# Patient Record
Sex: Female | Born: 1945 | Race: Black or African American | Hispanic: No | Smoking: Never smoker
Health system: Southern US, Community
[De-identification: ages and names within clinical notes are randomized; demographics above are authoritative.]

## PROBLEM LIST (undated history)

## (undated) DIAGNOSIS — E119 Type 2 diabetes mellitus without complications: Secondary | ICD-10-CM

## (undated) DIAGNOSIS — I2510 Atherosclerotic heart disease of native coronary artery without angina pectoris: Secondary | ICD-10-CM

## (undated) DIAGNOSIS — R911 Solitary pulmonary nodule: Secondary | ICD-10-CM

## (undated) DIAGNOSIS — N760 Acute vaginitis: Secondary | ICD-10-CM

## (undated) DIAGNOSIS — I739 Peripheral vascular disease, unspecified: Secondary | ICD-10-CM

## (undated) DIAGNOSIS — J189 Pneumonia, unspecified organism: Secondary | ICD-10-CM

## (undated) DIAGNOSIS — M6740 Ganglion, unspecified site: Secondary | ICD-10-CM

## (undated) DIAGNOSIS — G8220 Paraplegia, unspecified: Secondary | ICD-10-CM

## (undated) DIAGNOSIS — F329 Major depressive disorder, single episode, unspecified: Secondary | ICD-10-CM

## (undated) DIAGNOSIS — F32A Depression, unspecified: Secondary | ICD-10-CM

## (undated) DIAGNOSIS — E785 Hyperlipidemia, unspecified: Secondary | ICD-10-CM

## (undated) DIAGNOSIS — M8580 Other specified disorders of bone density and structure, unspecified site: Secondary | ICD-10-CM

## (undated) DIAGNOSIS — I639 Cerebral infarction, unspecified: Secondary | ICD-10-CM

## (undated) DIAGNOSIS — I10 Essential (primary) hypertension: Secondary | ICD-10-CM

## (undated) DIAGNOSIS — K219 Gastro-esophageal reflux disease without esophagitis: Secondary | ICD-10-CM

## (undated) DIAGNOSIS — D649 Anemia, unspecified: Secondary | ICD-10-CM

## (undated) DIAGNOSIS — R0602 Shortness of breath: Secondary | ICD-10-CM

## (undated) DIAGNOSIS — I219 Acute myocardial infarction, unspecified: Secondary | ICD-10-CM

## (undated) DIAGNOSIS — I82409 Acute embolism and thrombosis of unspecified deep veins of unspecified lower extremity: Secondary | ICD-10-CM

## (undated) DIAGNOSIS — N189 Chronic kidney disease, unspecified: Secondary | ICD-10-CM

## (undated) HISTORY — DX: Peripheral vascular disease, unspecified: I73.9

## (undated) HISTORY — DX: Acute vaginitis: N76.0

## (undated) HISTORY — DX: Paraplegia, unspecified: G82.20

## (undated) HISTORY — DX: Acute embolism and thrombosis of unspecified deep veins of unspecified lower extremity: I82.409

## (undated) HISTORY — PX: CATARACT EXTRACTION, BILATERAL: SHX1313

## (undated) HISTORY — DX: Hyperlipidemia, unspecified: E78.5

## (undated) HISTORY — PX: DILATION AND CURETTAGE OF UTERUS: SHX78

## (undated) HISTORY — DX: Solitary pulmonary nodule: R91.1

## (undated) HISTORY — DX: Atherosclerotic heart disease of native coronary artery without angina pectoris: I25.10

## (undated) HISTORY — DX: Gastro-esophageal reflux disease without esophagitis: K21.9

## (undated) HISTORY — DX: Major depressive disorder, single episode, unspecified: F32.9

## (undated) HISTORY — PX: SHOULDER ARTHROSCOPY W/ ROTATOR CUFF REPAIR: SHX2400

## (undated) HISTORY — PX: VAGINAL HYSTERECTOMY: SUR661

## (undated) HISTORY — DX: Ganglion, unspecified site: M67.40

## (undated) HISTORY — DX: Depression, unspecified: F32.A

## (undated) HISTORY — DX: Anemia, unspecified: D64.9

## (undated) HISTORY — DX: Other specified disorders of bone density and structure, unspecified site: M85.80

## (undated) HISTORY — PX: BACK SURGERY: SHX140

## (undated) HISTORY — DX: Essential (primary) hypertension: I10

## (undated) HISTORY — DX: Acute myocardial infarction, unspecified: I21.9

---

## 1979-03-09 HISTORY — PX: EXCISIONAL HEMORRHOIDECTOMY: SHX1541

## 1998-02-24 ENCOUNTER — Other Ambulatory Visit: Admission: RE | Admit: 1998-02-24 | Discharge: 1998-02-24 | Payer: Self-pay | Admitting: Gynecology

## 1999-03-20 ENCOUNTER — Other Ambulatory Visit: Admission: RE | Admit: 1999-03-20 | Discharge: 1999-03-20 | Payer: Self-pay | Admitting: Gynecology

## 1999-05-08 ENCOUNTER — Ambulatory Visit (HOSPITAL_COMMUNITY): Admission: RE | Admit: 1999-05-08 | Discharge: 1999-05-09 | Payer: Self-pay | Admitting: Cardiology

## 1999-05-08 ENCOUNTER — Encounter: Payer: Self-pay | Admitting: Cardiology

## 1999-12-28 ENCOUNTER — Encounter: Payer: Self-pay | Admitting: Emergency Medicine

## 1999-12-28 ENCOUNTER — Emergency Department (HOSPITAL_COMMUNITY): Admission: EM | Admit: 1999-12-28 | Discharge: 1999-12-28 | Payer: Self-pay | Admitting: Emergency Medicine

## 2000-01-25 ENCOUNTER — Emergency Department (HOSPITAL_COMMUNITY): Admission: EM | Admit: 2000-01-25 | Discharge: 2000-01-25 | Payer: Self-pay | Admitting: Emergency Medicine

## 2000-01-25 ENCOUNTER — Encounter: Payer: Self-pay | Admitting: Emergency Medicine

## 2000-08-14 ENCOUNTER — Other Ambulatory Visit: Admission: RE | Admit: 2000-08-14 | Discharge: 2000-08-14 | Payer: Self-pay | Admitting: Gynecology

## 2001-06-17 ENCOUNTER — Encounter: Payer: Self-pay | Admitting: Specialist

## 2001-06-22 ENCOUNTER — Ambulatory Visit (HOSPITAL_COMMUNITY): Admission: RE | Admit: 2001-06-22 | Discharge: 2001-06-22 | Payer: Self-pay | Admitting: Specialist

## 2001-08-26 ENCOUNTER — Ambulatory Visit (HOSPITAL_COMMUNITY): Admission: RE | Admit: 2001-08-26 | Discharge: 2001-08-26 | Payer: Self-pay | Admitting: Specialist

## 2002-05-25 ENCOUNTER — Ambulatory Visit (HOSPITAL_COMMUNITY): Admission: RE | Admit: 2002-05-25 | Discharge: 2002-05-25 | Payer: Self-pay | Admitting: Internal Medicine

## 2002-05-25 ENCOUNTER — Encounter: Payer: Self-pay | Admitting: Internal Medicine

## 2003-05-31 ENCOUNTER — Ambulatory Visit (HOSPITAL_COMMUNITY): Admission: RE | Admit: 2003-05-31 | Discharge: 2003-06-01 | Payer: Self-pay | Admitting: Ophthalmology

## 2004-05-11 ENCOUNTER — Emergency Department (HOSPITAL_COMMUNITY): Admission: EM | Admit: 2004-05-11 | Discharge: 2004-05-11 | Payer: Self-pay | Admitting: Emergency Medicine

## 2004-08-05 ENCOUNTER — Emergency Department (HOSPITAL_COMMUNITY): Admission: EM | Admit: 2004-08-05 | Discharge: 2004-08-05 | Payer: Self-pay | Admitting: Family Medicine

## 2005-01-14 ENCOUNTER — Ambulatory Visit: Payer: Self-pay | Admitting: Internal Medicine

## 2005-02-07 ENCOUNTER — Ambulatory Visit: Payer: Self-pay | Admitting: Cardiology

## 2005-06-14 ENCOUNTER — Ambulatory Visit: Payer: Self-pay | Admitting: Internal Medicine

## 2006-01-14 ENCOUNTER — Ambulatory Visit: Payer: Self-pay | Admitting: Internal Medicine

## 2006-04-03 ENCOUNTER — Ambulatory Visit: Payer: Self-pay | Admitting: Cardiology

## 2006-05-16 ENCOUNTER — Emergency Department (HOSPITAL_COMMUNITY): Admission: EM | Admit: 2006-05-16 | Discharge: 2006-05-16 | Payer: Self-pay | Admitting: Emergency Medicine

## 2006-07-08 LAB — CONVERTED CEMR LAB: Pap Smear: NORMAL

## 2006-10-15 ENCOUNTER — Ambulatory Visit: Payer: Self-pay | Admitting: Internal Medicine

## 2006-10-15 LAB — CONVERTED CEMR LAB
ALT: 11 units/L (ref 0–40)
AST: 18 units/L (ref 0–37)
Albumin: 3.3 g/dL — ABNORMAL LOW (ref 3.5–5.2)
Alkaline Phosphatase: 57 units/L (ref 39–117)
BUN: 14 mg/dL (ref 6–23)
Basophils Relative: 0.1 % (ref 0.0–1.0)
CO2: 26 meq/L (ref 19–32)
Calcium: 8.9 mg/dL (ref 8.4–10.5)
Chloride: 113 meq/L — ABNORMAL HIGH (ref 96–112)
Creatinine, Ser: 1 mg/dL (ref 0.4–1.2)
Crystals: NEGATIVE
Eosinophils Relative: 2.6 % (ref 0.0–5.0)
HDL: 53.7 mg/dL (ref >39.0)
Hemoglobin: 10.7 g/dL — ABNORMAL LOW (ref 12.0–15.0)
LDL Cholesterol: 80 mg/dL (ref 0–99)
Lymphocytes Relative: 25 % (ref 12.0–46.0)
Microalb, Ur: 6.1 mg/dL — ABNORMAL HIGH (ref 0.0–1.9)
Monocytes Absolute: 0.3 10*3/uL (ref 0.2–0.7)
Monocytes Relative: 3.7 % (ref 3.0–11.0)
Nitrite: NEGATIVE
RBC / HPF: NONE SEEN
RDW: 13.2 % (ref 11.5–14.6)
Specific Gravity, Urine: 1.015 (ref 1.000–1.03)
TSH: 0.6 microintl units/mL (ref 0.35–5.50)
Total Bilirubin: 0.5 mg/dL (ref 0.3–1.2)
VLDL: 16 mg/dL (ref 0–40)
WBC: 7 10*3/uL (ref 4.5–10.5)

## 2007-04-10 ENCOUNTER — Ambulatory Visit: Payer: Self-pay | Admitting: Internal Medicine

## 2007-04-12 ENCOUNTER — Encounter: Payer: Self-pay | Admitting: Internal Medicine

## 2007-04-12 DIAGNOSIS — M6740 Ganglion, unspecified site: Secondary | ICD-10-CM

## 2007-04-12 DIAGNOSIS — E785 Hyperlipidemia, unspecified: Secondary | ICD-10-CM

## 2007-04-12 DIAGNOSIS — I10 Essential (primary) hypertension: Secondary | ICD-10-CM | POA: Insufficient documentation

## 2007-04-12 DIAGNOSIS — I2510 Atherosclerotic heart disease of native coronary artery without angina pectoris: Secondary | ICD-10-CM | POA: Insufficient documentation

## 2007-04-12 DIAGNOSIS — F411 Generalized anxiety disorder: Secondary | ICD-10-CM | POA: Insufficient documentation

## 2007-04-12 DIAGNOSIS — F329 Major depressive disorder, single episode, unspecified: Secondary | ICD-10-CM

## 2007-11-04 ENCOUNTER — Ambulatory Visit: Payer: Self-pay | Admitting: Internal Medicine

## 2007-11-04 DIAGNOSIS — J309 Allergic rhinitis, unspecified: Secondary | ICD-10-CM | POA: Insufficient documentation

## 2007-11-04 DIAGNOSIS — N760 Acute vaginitis: Secondary | ICD-10-CM | POA: Insufficient documentation

## 2007-11-04 DIAGNOSIS — M949 Disorder of cartilage, unspecified: Secondary | ICD-10-CM

## 2007-11-04 DIAGNOSIS — D649 Anemia, unspecified: Secondary | ICD-10-CM | POA: Insufficient documentation

## 2007-11-04 DIAGNOSIS — M899 Disorder of bone, unspecified: Secondary | ICD-10-CM | POA: Insufficient documentation

## 2007-11-06 ENCOUNTER — Encounter: Payer: Self-pay | Admitting: Internal Medicine

## 2007-11-09 ENCOUNTER — Encounter (INDEPENDENT_AMBULATORY_CARE_PROVIDER_SITE_OTHER): Payer: Self-pay | Admitting: *Deleted

## 2007-11-09 LAB — CONVERTED CEMR LAB
Albumin: 3.9 g/dL (ref 3.5–5.2)
BUN: 15 mg/dL (ref 6–23)
Bacteria, UA: NEGATIVE
Basophils Relative: 0.1 % (ref 0.0–1.0)
Calcium: 10.1 mg/dL (ref 8.4–10.5)
Cholesterol: 221 mg/dL (ref 0–200)
Creatinine, Ser: 1.1 mg/dL (ref 0.4–1.2)
Creatinine,U: 80.1 mg/dL
Crystals: NEGATIVE
Eosinophils Absolute: 0.2 10*3/uL (ref 0.0–0.7)
Eosinophils Relative: 1.9 % (ref 0.0–5.0)
GFR calc Af Amer: 64 mL/min
GFR calc non Af Amer: 53 mL/min
HCT: 41.7 % (ref 36.0–46.0)
HDL: 62.6 mg/dL (ref >39.0)
Hemoglobin, Urine: NEGATIVE
Hgb A1c MFr Bld: 12 % — ABNORMAL HIGH (ref 4.6–6.0)
Ketones, ur: NEGATIVE mg/dL
MCV: 82.6 fL (ref 78.0–100.0)
Microalb Creat Ratio: 176 mg/g — ABNORMAL HIGH (ref 0.0–30.0)
Monocytes Absolute: 0.4 10*3/uL (ref 0.1–1.0)
Platelets: 240 10*3/uL (ref 150–400)
RBC / HPF: NONE SEEN
Total Protein, Urine: 30 mg/dL — AB
Triglycerides: 152 mg/dL — ABNORMAL HIGH (ref 0–149)
Urine Glucose: >=1000 mg/dL — CR
WBC: 8.3 10*3/uL (ref 4.5–10.5)

## 2008-03-10 ENCOUNTER — Ambulatory Visit: Payer: Self-pay | Admitting: Internal Medicine

## 2008-03-10 LAB — CONVERTED CEMR LAB
Chloride: 105 meq/L (ref 96–112)
Creatinine, Ser: 1.1 mg/dL (ref 0.4–1.2)
GFR calc Af Amer: 64 mL/min
GFR calc non Af Amer: 53 mL/min
Hgb A1c MFr Bld: 12.7 % — ABNORMAL HIGH (ref 4.6–6.0)
LDL Cholesterol: 75 mg/dL (ref 0–99)
Potassium: 4.3 meq/L (ref 3.5–5.1)
Total CHOL/HDL Ratio: 2.9
Triglycerides: 92 mg/dL (ref 0–149)

## 2008-03-16 ENCOUNTER — Telehealth (INDEPENDENT_AMBULATORY_CARE_PROVIDER_SITE_OTHER): Payer: Self-pay | Admitting: *Deleted

## 2008-06-17 ENCOUNTER — Ambulatory Visit: Payer: Self-pay | Admitting: Endocrinology

## 2008-06-17 DIAGNOSIS — E042 Nontoxic multinodular goiter: Secondary | ICD-10-CM

## 2008-06-20 ENCOUNTER — Encounter: Payer: Self-pay | Admitting: Endocrinology

## 2008-07-06 ENCOUNTER — Encounter: Admission: RE | Admit: 2008-07-06 | Discharge: 2008-07-06 | Payer: Self-pay | Admitting: Endocrinology

## 2008-07-08 DIAGNOSIS — G8220 Paraplegia, unspecified: Secondary | ICD-10-CM

## 2008-07-08 DIAGNOSIS — I639 Cerebral infarction, unspecified: Secondary | ICD-10-CM

## 2008-07-08 HISTORY — DX: Paraplegia, unspecified: G82.20

## 2008-07-08 HISTORY — PX: LAMINECTOMY: SHX219

## 2008-07-08 HISTORY — DX: Cerebral infarction, unspecified: I63.9

## 2008-09-09 ENCOUNTER — Ambulatory Visit: Payer: Self-pay | Admitting: Internal Medicine

## 2008-09-09 LAB — CONVERTED CEMR LAB
ALT: 12 units/L (ref 0–35)
Albumin: 3.2 g/dL — ABNORMAL LOW (ref 3.5–5.2)
BUN: 17 mg/dL (ref 6–23)
Bacteria, UA: NEGATIVE
Basophils Relative: 0.4 % (ref 0.0–3.0)
Bilirubin Urine: NEGATIVE
CO2: 27 meq/L (ref 19–32)
Calcium: 9.1 mg/dL (ref 8.4–10.5)
Crystals: NEGATIVE
Eosinophils Relative: 2.3 % (ref 0.0–5.0)
GFR calc Af Amer: 72 mL/min
Glucose, Bld: 243 mg/dL — ABNORMAL HIGH (ref 70–99)
HCT: 35.9 % — ABNORMAL LOW (ref 36.0–46.0)
HDL: 60.2 mg/dL (ref >39.0)
Hemoglobin: 11.9 g/dL — ABNORMAL LOW (ref 12.0–15.0)
Lymphocytes Relative: 19.4 % (ref 12.0–46.0)
Monocytes Absolute: 0.5 10*3/uL (ref 0.1–1.0)
Monocytes Relative: 5.7 % (ref 3.0–12.0)
Neutro Abs: 6.2 10*3/uL (ref 1.4–7.7)
Nitrite: NEGATIVE
RBC: 4.44 M/uL (ref 3.87–5.11)
RDW: 13.2 % (ref 11.5–14.6)
Specific Gravity, Urine: 1.01 (ref 1.000–1.035)
TSH: 1.09 microintl units/mL (ref 0.35–5.50)
Total Protein, Urine: 100 mg/dL — AB
Total Protein: 7.2 g/dL (ref 6.0–8.3)
WBC: 8.6 10*3/uL (ref 4.5–10.5)
pH: 5.5 (ref 5.0–8.0)

## 2008-09-27 ENCOUNTER — Ambulatory Visit: Payer: Self-pay | Admitting: Internal Medicine

## 2008-09-27 DIAGNOSIS — M549 Dorsalgia, unspecified: Secondary | ICD-10-CM | POA: Insufficient documentation

## 2008-09-28 ENCOUNTER — Telehealth (INDEPENDENT_AMBULATORY_CARE_PROVIDER_SITE_OTHER): Payer: Self-pay | Admitting: *Deleted

## 2008-09-28 ENCOUNTER — Encounter: Payer: Self-pay | Admitting: Internal Medicine

## 2008-10-02 ENCOUNTER — Ambulatory Visit: Payer: Self-pay | Admitting: Internal Medicine

## 2008-10-02 ENCOUNTER — Inpatient Hospital Stay (HOSPITAL_COMMUNITY): Admission: EM | Admit: 2008-10-02 | Discharge: 2008-10-21 | Payer: Self-pay | Admitting: Emergency Medicine

## 2008-10-03 ENCOUNTER — Encounter: Payer: Self-pay | Admitting: Cardiothoracic Surgery

## 2008-10-03 ENCOUNTER — Encounter: Payer: Self-pay | Admitting: Cardiology

## 2008-10-03 ENCOUNTER — Ambulatory Visit: Payer: Self-pay | Admitting: Cardiothoracic Surgery

## 2008-10-04 ENCOUNTER — Encounter: Payer: Self-pay | Admitting: Cardiothoracic Surgery

## 2008-10-04 ENCOUNTER — Encounter: Payer: Self-pay | Admitting: Internal Medicine

## 2008-10-04 DIAGNOSIS — IMO0002 Reserved for concepts with insufficient information to code with codable children: Secondary | ICD-10-CM | POA: Insufficient documentation

## 2008-10-04 DIAGNOSIS — I219 Acute myocardial infarction, unspecified: Secondary | ICD-10-CM | POA: Insufficient documentation

## 2008-10-04 DIAGNOSIS — E1065 Type 1 diabetes mellitus with hyperglycemia: Secondary | ICD-10-CM

## 2008-10-11 ENCOUNTER — Ambulatory Visit: Payer: Self-pay | Admitting: Physical Medicine & Rehabilitation

## 2008-10-14 ENCOUNTER — Encounter (INDEPENDENT_AMBULATORY_CARE_PROVIDER_SITE_OTHER): Payer: Self-pay | Admitting: Neurosurgery

## 2008-10-17 ENCOUNTER — Encounter: Payer: Self-pay | Admitting: Internal Medicine

## 2008-10-21 ENCOUNTER — Ambulatory Visit: Payer: Self-pay | Admitting: Physical Medicine & Rehabilitation

## 2008-10-21 ENCOUNTER — Inpatient Hospital Stay (HOSPITAL_COMMUNITY)
Admission: RE | Admit: 2008-10-21 | Discharge: 2008-11-04 | Payer: Self-pay | Admitting: Physical Medicine & Rehabilitation

## 2008-10-27 ENCOUNTER — Encounter: Payer: Self-pay | Admitting: Cardiology

## 2008-10-27 ENCOUNTER — Encounter: Payer: Self-pay | Admitting: Physical Medicine & Rehabilitation

## 2008-11-01 ENCOUNTER — Ambulatory Visit: Payer: Self-pay | Admitting: Cardiothoracic Surgery

## 2008-11-04 ENCOUNTER — Encounter: Payer: Self-pay | Admitting: Internal Medicine

## 2008-11-10 ENCOUNTER — Telehealth (INDEPENDENT_AMBULATORY_CARE_PROVIDER_SITE_OTHER): Payer: Self-pay | Admitting: *Deleted

## 2008-11-14 ENCOUNTER — Telehealth: Payer: Self-pay | Admitting: Cardiology

## 2008-11-21 ENCOUNTER — Encounter: Payer: Self-pay | Admitting: Cardiology

## 2008-11-22 ENCOUNTER — Encounter: Payer: Self-pay | Admitting: Cardiology

## 2008-11-28 ENCOUNTER — Encounter: Payer: Self-pay | Admitting: Internal Medicine

## 2008-12-12 ENCOUNTER — Ambulatory Visit (HOSPITAL_COMMUNITY): Admission: RE | Admit: 2008-12-12 | Discharge: 2008-12-12 | Payer: Self-pay | Admitting: Neurosurgery

## 2008-12-14 ENCOUNTER — Telehealth: Payer: Self-pay | Admitting: Internal Medicine

## 2008-12-14 ENCOUNTER — Telehealth: Payer: Self-pay | Admitting: Cardiology

## 2008-12-16 ENCOUNTER — Ambulatory Visit: Payer: Self-pay | Admitting: Internal Medicine

## 2008-12-19 ENCOUNTER — Telehealth: Payer: Self-pay | Admitting: Internal Medicine

## 2009-01-11 ENCOUNTER — Ambulatory Visit: Payer: Self-pay | Admitting: Internal Medicine

## 2009-01-12 ENCOUNTER — Encounter: Payer: Self-pay | Admitting: Internal Medicine

## 2009-01-16 ENCOUNTER — Emergency Department (HOSPITAL_COMMUNITY): Admission: EM | Admit: 2009-01-16 | Discharge: 2009-01-16 | Payer: Self-pay | Admitting: Family Medicine

## 2009-01-26 ENCOUNTER — Encounter: Payer: Self-pay | Admitting: Internal Medicine

## 2009-02-10 ENCOUNTER — Encounter: Admission: RE | Admit: 2009-02-10 | Discharge: 2009-03-27 | Payer: Self-pay | Admitting: Neurosurgery

## 2009-03-08 ENCOUNTER — Encounter: Payer: Self-pay | Admitting: Cardiology

## 2009-03-08 DIAGNOSIS — J984 Other disorders of lung: Secondary | ICD-10-CM

## 2009-03-08 HISTORY — PX: CORONARY ARTERY BYPASS GRAFT: SHX141

## 2009-03-11 DIAGNOSIS — G8220 Paraplegia, unspecified: Secondary | ICD-10-CM

## 2009-03-11 DIAGNOSIS — I82409 Acute embolism and thrombosis of unspecified deep veins of unspecified lower extremity: Secondary | ICD-10-CM | POA: Insufficient documentation

## 2009-03-15 ENCOUNTER — Ambulatory Visit: Payer: Self-pay | Admitting: Cardiology

## 2009-03-15 ENCOUNTER — Ambulatory Visit: Payer: Self-pay | Admitting: Internal Medicine

## 2009-03-17 ENCOUNTER — Telehealth: Payer: Self-pay | Admitting: Internal Medicine

## 2009-03-23 ENCOUNTER — Ambulatory Visit: Payer: Self-pay | Admitting: Cardiothoracic Surgery

## 2009-03-27 ENCOUNTER — Encounter: Payer: Self-pay | Admitting: Cardiothoracic Surgery

## 2009-03-27 ENCOUNTER — Ambulatory Visit: Payer: Self-pay | Admitting: Cardiology

## 2009-03-29 ENCOUNTER — Inpatient Hospital Stay (HOSPITAL_COMMUNITY): Admission: RE | Admit: 2009-03-29 | Discharge: 2009-04-07 | Payer: Self-pay | Admitting: Cardiothoracic Surgery

## 2009-03-29 ENCOUNTER — Ambulatory Visit: Payer: Self-pay | Admitting: Cardiology

## 2009-03-29 ENCOUNTER — Other Ambulatory Visit: Payer: Self-pay | Admitting: Cardiothoracic Surgery

## 2009-03-29 ENCOUNTER — Ambulatory Visit: Payer: Self-pay | Admitting: Cardiothoracic Surgery

## 2009-03-31 ENCOUNTER — Ambulatory Visit: Payer: Self-pay | Admitting: Physical Medicine & Rehabilitation

## 2009-04-07 ENCOUNTER — Encounter (INDEPENDENT_AMBULATORY_CARE_PROVIDER_SITE_OTHER): Payer: Self-pay | Admitting: *Deleted

## 2009-04-27 ENCOUNTER — Encounter: Payer: Self-pay | Admitting: Cardiology

## 2009-04-27 ENCOUNTER — Ambulatory Visit: Payer: Self-pay | Admitting: Cardiothoracic Surgery

## 2009-04-27 ENCOUNTER — Encounter: Admission: RE | Admit: 2009-04-27 | Discharge: 2009-04-27 | Payer: Self-pay | Admitting: Cardiothoracic Surgery

## 2009-05-02 ENCOUNTER — Encounter (INDEPENDENT_AMBULATORY_CARE_PROVIDER_SITE_OTHER): Payer: Self-pay | Admitting: *Deleted

## 2009-05-12 ENCOUNTER — Encounter (INDEPENDENT_AMBULATORY_CARE_PROVIDER_SITE_OTHER): Payer: Self-pay | Admitting: *Deleted

## 2009-05-15 ENCOUNTER — Encounter: Payer: Self-pay | Admitting: Cardiology

## 2009-05-15 ENCOUNTER — Encounter: Payer: Self-pay | Admitting: Nurse Practitioner

## 2009-05-15 ENCOUNTER — Ambulatory Visit: Payer: Self-pay | Admitting: Internal Medicine

## 2009-05-15 DIAGNOSIS — I4891 Unspecified atrial fibrillation: Secondary | ICD-10-CM | POA: Insufficient documentation

## 2009-05-22 ENCOUNTER — Telehealth (INDEPENDENT_AMBULATORY_CARE_PROVIDER_SITE_OTHER): Payer: Self-pay | Admitting: *Deleted

## 2009-05-25 ENCOUNTER — Ambulatory Visit: Payer: Self-pay | Admitting: Cardiology

## 2009-05-25 ENCOUNTER — Ambulatory Visit: Payer: Self-pay | Admitting: Cardiothoracic Surgery

## 2009-05-25 DIAGNOSIS — D432 Neoplasm of uncertain behavior of brain, unspecified: Secondary | ICD-10-CM

## 2009-05-25 DIAGNOSIS — D434 Neoplasm of uncertain behavior of spinal cord: Secondary | ICD-10-CM

## 2009-06-05 LAB — CONVERTED CEMR LAB
Basophils Absolute: 0 10*3/uL (ref 0.0–0.1)
CO2: 27 meq/L (ref 19–32)
Calcium: 9.7 mg/dL (ref 8.4–10.5)
Creatinine, Ser: 1 mg/dL (ref 0.4–1.2)
Eosinophils Absolute: 0.2 10*3/uL (ref 0.0–0.7)
GFR calc non Af Amer: 71.43 mL/min (ref >60)
Glucose, Bld: 114 mg/dL — ABNORMAL HIGH (ref 70–99)
Hemoglobin: 11.3 g/dL — ABNORMAL LOW (ref 12.0–15.0)
Lymphocytes Relative: 16.5 % (ref 12.0–46.0)
MCHC: 32.5 g/dL (ref 30.0–36.0)
Monocytes Relative: 4.2 % (ref 3.0–12.0)
Neutro Abs: 6.2 10*3/uL (ref 1.4–7.7)
Neutrophils Relative %: 76.5 % (ref 43.0–77.0)
RDW: 14.9 % — ABNORMAL HIGH (ref 11.5–14.6)
Sodium: 140 meq/L (ref 135–145)

## 2009-06-08 ENCOUNTER — Telehealth (INDEPENDENT_AMBULATORY_CARE_PROVIDER_SITE_OTHER): Payer: Self-pay | Admitting: *Deleted

## 2009-06-28 ENCOUNTER — Telehealth (INDEPENDENT_AMBULATORY_CARE_PROVIDER_SITE_OTHER): Payer: Self-pay | Admitting: *Deleted

## 2009-07-05 ENCOUNTER — Telehealth (INDEPENDENT_AMBULATORY_CARE_PROVIDER_SITE_OTHER): Payer: Self-pay | Admitting: *Deleted

## 2009-07-10 ENCOUNTER — Telehealth (INDEPENDENT_AMBULATORY_CARE_PROVIDER_SITE_OTHER): Payer: Self-pay | Admitting: *Deleted

## 2009-07-13 ENCOUNTER — Encounter: Payer: Self-pay | Admitting: Cardiology

## 2009-07-13 ENCOUNTER — Ambulatory Visit: Payer: Self-pay | Admitting: Internal Medicine

## 2009-07-13 ENCOUNTER — Ambulatory Visit: Payer: Self-pay | Admitting: Cardiology

## 2009-07-13 ENCOUNTER — Ambulatory Visit: Payer: Self-pay

## 2009-07-13 ENCOUNTER — Ambulatory Visit (HOSPITAL_COMMUNITY): Admission: RE | Admit: 2009-07-13 | Discharge: 2009-07-13 | Payer: Self-pay | Admitting: Cardiology

## 2009-07-13 DIAGNOSIS — R609 Edema, unspecified: Secondary | ICD-10-CM | POA: Insufficient documentation

## 2009-07-31 ENCOUNTER — Telehealth: Payer: Self-pay | Admitting: Internal Medicine

## 2009-07-31 ENCOUNTER — Telehealth (INDEPENDENT_AMBULATORY_CARE_PROVIDER_SITE_OTHER): Payer: Self-pay | Admitting: *Deleted

## 2009-08-10 ENCOUNTER — Ambulatory Visit: Payer: Self-pay

## 2009-08-10 ENCOUNTER — Encounter: Payer: Self-pay | Admitting: Cardiology

## 2009-08-10 ENCOUNTER — Ambulatory Visit: Payer: Self-pay | Admitting: Internal Medicine

## 2009-08-11 LAB — CONVERTED CEMR LAB
ALT: 12 units/L (ref 0–35)
Albumin: 3.9 g/dL (ref 3.5–5.2)
Basophils Relative: 0.1 % (ref 0.0–3.0)
Bilirubin, Direct: 0.1 mg/dL (ref 0.0–0.3)
CO2: 27 meq/L (ref 19–32)
Chloride: 99 meq/L (ref 96–112)
Creatinine,U: 103 mg/dL
Eosinophils Absolute: 0.2 10*3/uL (ref 0.0–0.7)
Eosinophils Relative: 1.8 % (ref 0.0–5.0)
HCT: 35.7 % — ABNORMAL LOW (ref 36.0–46.0)
Hemoglobin: 11.3 g/dL — ABNORMAL LOW (ref 12.0–15.0)
Hgb A1c MFr Bld: 7.5 % — ABNORMAL HIGH (ref 4.6–6.5)
Lymphs Abs: 1.4 10*3/uL (ref 0.7–4.0)
MCHC: 31.6 g/dL (ref 30.0–36.0)
MCV: 80.9 fL (ref 78.0–100.0)
Microalb Creat Ratio: 378.6 mg/g — ABNORMAL HIGH (ref 0.0–30.0)
Monocytes Absolute: 0.4 10*3/uL (ref 0.1–1.0)
Neutro Abs: 6.7 10*3/uL (ref 1.4–7.7)
Neutrophils Relative %: 77.7 % — ABNORMAL HIGH (ref 43.0–77.0)
Nitrite: NEGATIVE
Potassium: 4.6 meq/L (ref 3.5–5.1)
RBC: 4.42 M/uL (ref 3.87–5.11)
Sodium: 139 meq/L (ref 135–145)
Total CHOL/HDL Ratio: 2
Total Protein, Urine: 30 mg/dL
Total Protein: 8.4 g/dL — ABNORMAL HIGH (ref 6.0–8.3)
Transferrin: 203.9 mg/dL — ABNORMAL LOW (ref 212.0–360.0)
Triglycerides: 85 mg/dL (ref 0.0–149.0)
Urine Glucose: NEGATIVE mg/dL
WBC: 8.7 10*3/uL (ref 4.5–10.5)
pH: 5 (ref 5.0–8.0)

## 2009-08-22 ENCOUNTER — Encounter: Payer: Self-pay | Admitting: Cardiology

## 2009-08-22 ENCOUNTER — Ambulatory Visit: Payer: Self-pay

## 2009-09-07 ENCOUNTER — Encounter: Payer: Self-pay | Admitting: Internal Medicine

## 2009-09-07 ENCOUNTER — Ambulatory Visit: Payer: Self-pay | Admitting: Cardiothoracic Surgery

## 2009-09-09 ENCOUNTER — Encounter: Payer: Self-pay | Admitting: Cardiology

## 2009-09-18 ENCOUNTER — Ambulatory Visit: Payer: Self-pay | Admitting: Internal Medicine

## 2009-09-18 ENCOUNTER — Ambulatory Visit: Payer: Self-pay | Admitting: Cardiology

## 2009-09-18 DIAGNOSIS — I25810 Atherosclerosis of coronary artery bypass graft(s) without angina pectoris: Secondary | ICD-10-CM | POA: Insufficient documentation

## 2009-09-18 DIAGNOSIS — I498 Other specified cardiac arrhythmias: Secondary | ICD-10-CM

## 2009-09-26 LAB — CONVERTED CEMR LAB
BUN: 34 mg/dL — ABNORMAL HIGH (ref 6–23)
Calcium: 9.6 mg/dL (ref 8.4–10.5)
Creatinine, Ser: 1.3 mg/dL — ABNORMAL HIGH (ref 0.4–1.2)
Eosinophils Relative: 0.9 % (ref 0.0–5.0)
GFR calc non Af Amer: 52.72 mL/min (ref >60)
Lymphocytes Relative: 10.2 % — ABNORMAL LOW (ref 12.0–46.0)
Monocytes Absolute: 0.2 10*3/uL (ref 0.1–1.0)
Monocytes Relative: 2.2 % — ABNORMAL LOW (ref 3.0–12.0)
Neutrophils Relative %: 86.4 % — ABNORMAL HIGH (ref 43.0–77.0)
Platelets: 230 10*3/uL (ref 150.0–400.0)
WBC: 9.5 10*3/uL (ref 4.5–10.5)

## 2009-10-03 ENCOUNTER — Ambulatory Visit: Payer: Self-pay | Admitting: Cardiology

## 2009-10-17 LAB — CONVERTED CEMR LAB
Ferritin: 95.6 ng/mL (ref 10.0–291.0)
Iron: 53 ug/dL (ref 42–145)
Retic Ct Pct: 0.7 % (ref 0.4–3.1)
Transferrin: 195.1 mg/dL — ABNORMAL LOW (ref 212.0–360.0)

## 2009-10-18 ENCOUNTER — Encounter: Payer: Self-pay | Admitting: Internal Medicine

## 2009-11-08 ENCOUNTER — Ambulatory Visit: Payer: Self-pay | Admitting: Cardiology

## 2009-11-08 ENCOUNTER — Encounter (INDEPENDENT_AMBULATORY_CARE_PROVIDER_SITE_OTHER): Payer: Self-pay | Admitting: *Deleted

## 2009-11-08 ENCOUNTER — Ambulatory Visit: Payer: Self-pay | Admitting: Internal Medicine

## 2009-11-10 ENCOUNTER — Telehealth (INDEPENDENT_AMBULATORY_CARE_PROVIDER_SITE_OTHER): Payer: Self-pay | Admitting: *Deleted

## 2009-11-14 LAB — CONVERTED CEMR LAB
Basophils Absolute: 0 10*3/uL (ref 0.0–0.1)
Basophils Relative: 0.5 % (ref 0.0–3.0)
CO2: 32 meq/L (ref 19–32)
Calcium: 9.2 mg/dL (ref 8.4–10.5)
Creatinine, Ser: 0.9 mg/dL (ref 0.4–1.2)
Eosinophils Absolute: 0.2 10*3/uL (ref 0.0–0.7)
Lymphocytes Relative: 21.4 % (ref 12.0–46.0)
MCHC: 33.3 g/dL (ref 30.0–36.0)
Neutrophils Relative %: 68.4 % (ref 43.0–77.0)
RBC: 3.9 M/uL (ref 3.87–5.11)
TSH: 1.49 microintl units/mL (ref 0.35–5.50)

## 2009-12-18 ENCOUNTER — Telehealth: Payer: Self-pay | Admitting: Cardiology

## 2010-01-31 ENCOUNTER — Ambulatory Visit: Payer: Self-pay | Admitting: Internal Medicine

## 2010-01-31 DIAGNOSIS — R439 Unspecified disturbances of smell and taste: Secondary | ICD-10-CM

## 2010-01-31 LAB — CONVERTED CEMR LAB
Cholesterol: 177 mg/dL (ref 0–200)
GFR calc non Af Amer: 35.94 mL/min (ref >60)
Glucose, Bld: 120 mg/dL — ABNORMAL HIGH (ref 70–99)
HDL: 87.3 mg/dL (ref >39.00)
Hgb A1c MFr Bld: 6.8 % — ABNORMAL HIGH (ref 4.6–6.5)
Potassium: 5.5 meq/L — ABNORMAL HIGH (ref 3.5–5.1)
Sodium: 137 meq/L (ref 135–145)
Triglycerides: 88 mg/dL (ref 0.0–149.0)

## 2010-02-01 ENCOUNTER — Ambulatory Visit: Payer: Self-pay | Admitting: Gastroenterology

## 2010-02-01 DIAGNOSIS — E249 Cushing's syndrome, unspecified: Secondary | ICD-10-CM | POA: Insufficient documentation

## 2010-02-01 DIAGNOSIS — D509 Iron deficiency anemia, unspecified: Secondary | ICD-10-CM

## 2010-02-01 DIAGNOSIS — R195 Other fecal abnormalities: Secondary | ICD-10-CM

## 2010-02-13 ENCOUNTER — Telehealth: Payer: Self-pay | Admitting: Gastroenterology

## 2010-02-14 ENCOUNTER — Encounter (INDEPENDENT_AMBULATORY_CARE_PROVIDER_SITE_OTHER): Payer: Self-pay | Admitting: *Deleted

## 2010-02-14 DIAGNOSIS — E538 Deficiency of other specified B group vitamins: Secondary | ICD-10-CM | POA: Insufficient documentation

## 2010-02-14 LAB — CONVERTED CEMR LAB
ALT: 10 units/L (ref 0–35)
AST: 19 units/L (ref 0–37)
Albumin: 3.6 g/dL (ref 3.5–5.2)
Basophils Relative: 0.4 % (ref 0.0–3.0)
Eosinophils Absolute: 0.1 10*3/uL (ref 0.0–0.7)
Eosinophils Relative: 1.2 % (ref 0.0–5.0)
Hemoglobin: 10.4 g/dL — ABNORMAL LOW (ref 12.0–15.0)
Lymphocytes Relative: 11 % — ABNORMAL LOW (ref 12.0–46.0)
MCHC: 31 g/dL (ref 30.0–36.0)
MCV: 83.7 fL (ref 78.0–100.0)
Monocytes Absolute: 0.3 10*3/uL (ref 0.1–1.0)
Neutro Abs: 8.6 10*3/uL — ABNORMAL HIGH (ref 1.4–7.7)
RBC: 4.01 M/uL (ref 3.87–5.11)
TSH: 0.8 microintl units/mL (ref 0.35–5.50)

## 2010-02-19 ENCOUNTER — Ambulatory Visit: Payer: Self-pay | Admitting: Gastroenterology

## 2010-02-26 ENCOUNTER — Ambulatory Visit: Payer: Self-pay | Admitting: Gastroenterology

## 2010-03-05 ENCOUNTER — Ambulatory Visit: Payer: Self-pay | Admitting: Gastroenterology

## 2010-03-08 ENCOUNTER — Ambulatory Visit: Payer: Self-pay | Admitting: Cardiothoracic Surgery

## 2010-03-15 ENCOUNTER — Ambulatory Visit: Payer: Self-pay | Admitting: Internal Medicine

## 2010-03-26 ENCOUNTER — Encounter (INDEPENDENT_AMBULATORY_CARE_PROVIDER_SITE_OTHER): Payer: Self-pay | Admitting: *Deleted

## 2010-03-27 ENCOUNTER — Ambulatory Visit: Payer: Self-pay | Admitting: Gastroenterology

## 2010-03-29 ENCOUNTER — Telehealth: Payer: Self-pay | Admitting: Gastroenterology

## 2010-04-04 ENCOUNTER — Ambulatory Visit: Payer: Self-pay | Admitting: Gastroenterology

## 2010-04-30 ENCOUNTER — Telehealth: Payer: Self-pay | Admitting: Internal Medicine

## 2010-04-30 ENCOUNTER — Telehealth: Payer: Self-pay | Admitting: Gastroenterology

## 2010-05-04 ENCOUNTER — Telehealth: Payer: Self-pay | Admitting: Internal Medicine

## 2010-05-22 ENCOUNTER — Encounter: Payer: Self-pay | Admitting: Internal Medicine

## 2010-07-16 ENCOUNTER — Telehealth: Payer: Self-pay | Admitting: Internal Medicine

## 2010-07-28 ENCOUNTER — Other Ambulatory Visit: Payer: Self-pay | Admitting: Cardiothoracic Surgery

## 2010-07-28 DIAGNOSIS — R911 Solitary pulmonary nodule: Secondary | ICD-10-CM

## 2010-08-07 NOTE — Letter (Signed)
Summary: Appointment - Missed  [facility], Main Office  [address]   [place], [place] [address]   Phone: [phone]  Fax: [phone]     March 26, 2010 MRN: [id number]   [facility] [address] [place], [place]  [address]   Dear Ms. Maniscalco,  Our records indicate you missed your appointment on 03/01/2010 at 10:30am with Dr. Riley Kill. It is very important that we reach you to reschedule this appointment. We look forward to participating in your health care needs. Please contact us at the number listed above at your earliest convenience to reschedule this appointment.     Sincerely, Neurosurgeon Team LG

## 2010-08-07 NOTE — Assessment & Plan Note (Signed)
Summary: 6 wk f/u   Referring Provider:  t Riley Kill, md Primary Provider:  Corwin Levins MD  CC:  no complaints.  History of Present Illness: Doing well overall.  Rehab is coming to the home.  Patient is not having any chest pain whatsoever.  She has some swelling in the left leg.  Has not changed since surgery.  The swelling is slighly worse over the past few days.  Rehab is working with her in the home.  Current Medications (verified): 1)  Fexofenadine Hcl 180 Mg Tabs (Fexofenadine Hcl) .... Take 1 Tablet By Mouth Once A Day 2)  Meclizine Hcl 25 Mg Tabs (Meclizine Hcl) .... Take 1 Tablet By Mouth Four Times A Day 3)  Metformin Hcl 500 Mg Tabs (Metformin Hcl) .... 2 By Mouth Qam and 1 By Mouth At Bedtime 4)  Pravastatin Sodium 10 Mg Tabs (Pravastatin Sodium) .... Take 1 Tablet By Mouth Once A Day 5)  Nitroglycerin 0.4 Mg Subl (Nitroglycerin) .... One Tablet Under Tongue Every 5 Minutes As Needed For Chest Pain---May Repeat Times Three 6)  Folic Acid 1 Mg Tabs (Folic Acid) .Marland Kitchen.. 1 By Mouth Daily 7)  Novolog 100 Unit/ml Soln (Insulin Aspart) .... As Directed 8)  Iron 325 (65 Fe) Mg Tabs (Ferrous Sulfate) .Marland Kitchen.. 1 By Mouth Dialy 9)  Alprazolam 0.5 Mg Tabs (Alprazolam) .Marland Kitchen.. 1 By Mouth Q6 Hours 10)  Diazepam 5 Mg Tabs (Diazepam) .... As Needed 11)  Oxycodone Hcl 5 Mg Tabs (Oxycodone Hcl) .Marland Kitchen.. 1 Po Q 6 Hrs As Needed 12)  Robaxin 500 Mg Tabs (Methocarbamol) .... As Needed 13)  Metoprolol Tartrate 25 Mg Tabs (Metoprolol Tartrate) .... Take One Tablet By Mouth Twice A Day 14)  Aspirin 81 Mg Tbec (Aspirin) .... Take One Tablet By Mouth Daily 15)  Novolin 70/30 70-30 % Susp (Insulin Isophane & Regular) .... Take As Directed 16)  Hydrochlorothiazide 12.5 Mg Caps (Hydrochlorothiazide) .Marland Kitchen.. 1p O Once Daily 17)  Baclofen 10 Mg Tabs (Baclofen) .Marland Kitchen.. 1 By Mouth Two Times A Day As Needed Spasm  Allergies (verified): 1)  ! Pcn  Vital Signs:  Patient profile:   65 year old female Height:      63.5  inches Weight:      120 pounds BMI:     21.00 Pulse rate:   60 / minute BP sitting:   110 / 59  (left arm) Cuff size:   regular  Vitals Entered By: Hardin Negus, RMA (July 13, 2009 3:00 PM)  Physical Exam  General:  Well developed, well nourished, in no acute distress. Lungs:  Clear bilaterally to auscultation and percussion. Heart:  Normal S1 and S2.  No def murmur. Abdomen:  Bowel sounds positive; abdomen soft and non-tender without masses, organomegaly, or hernias noted. No hepatosplenomegaly. Extremities:  Decrease in pulses LLE.   Three plus edema. Neurologic:  Alert and oriented x 3.   Impression & Recommendations:  Problem # 1:  CORONARY ARTERY DISEASE (ICD-414.00) Perfectly stable and doing well.  No complaints.  Needs no further studies at present. Her updated medication list for this problem includes:    Nitroglycerin 0.4 Mg Subl (Nitroglycerin) ..... One tablet under tongue every 5 minutes as needed for chest pain---may repeat times three    Metoprolol Tartrate 25 Mg Tabs (Metoprolol tartrate) .Marland Kitchen... Take one tablet by mouth twice a day    Aspirin 81 Mg Tbec (Aspirin) .Marland Kitchen... Take one tablet by mouth daily  Problem # 2:  PERIPHERAL EDEMA (ICD-782.3) LLE.  Slight decrease in pulses, and worsened edema.  Given HCTZ by Dr. Jonny Ruiz.  Check BMET  Problem # 3:  LUNG NODULE (ICD-518.89) Reviewed report with family and stressed followup.  Repeat CT in March.  Non contrast of lower lobes.  Slight spiculation of lesion. Orders: TLB-BMP (Basic Metabolic Panel-BMET) (80048-METABOL) TLB-CBC Platelet - w/Differential (85025-CBCD) CT Scan  (CT Scan)  Problem # 4:  ANEMIA-NOS (ICD-285.9) Had microcytic anemia.  Has been on iron.  recheck.  had GI studies two or three years ago.  Defer eventually to Dr. Jonny Ruiz.  Other Orders: Arterial Duplex Lower Extremity (Arterial Duplex Low) Venous Duplex Lower Extremity (Venous Duplex Lower)  Patient Instructions: 1)  Your physician  recommends that you schedule a follow-up appointment in: f/u appoint in march when CT done 2)  Your physician recommends that you return for lab work in: today--cbc,bmet 3)  Non-Cardiac CT scanning, (CAT scanning), is a noninvasive, special x-ray that produces cross-sectional images of the body using x-rays and a computer. CT scans help physicians diagnose and treat medical conditions. For some CT exams, a contrast material is used to enhance visibility in the area of the body being studied. CT scans provide greater clarity and reveal more details than regular x-ray exams. 4)  Your physician has requested that you have a lower or upper extremity arterial duplex.  This test is an ultrasound of the arteries in the legs or arms.  It looks at arterial blood flow in the legs and arms.  Allow one hour for Lower and Upper Arterial scans. There are no restrictions or special instructions. 5)  Your physician has requested that you have a lower or upper extremity venous duplex.  This test is an ultrasound of the veins in the legs or arms.  It looks at venous blood flow that carries blood from the heart to the legs or arms.  Allow one hour for a Lower Venous exam.  Allow thirty minutes for an Upper Venous exam. There are no restrictions or special instructions.

## 2010-08-07 NOTE — Letter (Signed)
Summary: Disability response/[employer]  Disability response/[employer]   Imported By: Lester Ramona 10/18/2009 08:06:21  _____________________________________________________________________  External Attachment:    Type:   Image     Comment:   External Document

## 2010-08-07 NOTE — Progress Notes (Signed)
  Phone Note Call from Patient Call back at Home Phone [phone]   Caller: Patient Summary of Call: I called pt regarding refill req for test strips that were removed form Med list 03/15/09 by Dr Rosalyn Charters office. Per pt she is still checking her CBGs with [facility]  and it must have been removed in error. Med added to Med list for refill. Initial call taken by: Margaret Pyle, CMA,  May 04, 2010 8:57 AM    New/Updated Medications: ONETOUCH TEST  STRP (GLUCOSE BLOOD) use as directed two times a day

## 2010-08-07 NOTE — Assessment & Plan Note (Signed)
Summary: #2 of 3 weekly B12/266.2/dfs  Nurse Visit   Allergies: 1)  ! Pcn 2)  * Oxycodone  Medication Administration  Injection # 1:    Medication: Vit B12 1000 mcg    Diagnosis: B12 DEFICIENCY (ICD-266.2)    Route: IM    Site: R deltoid    Exp Date: 12/2011    Lot #: [id number]    Mfr: [employer]    Comments: pt to schedule # 3 of 3 weekly at front desk    Patient tolerated injection without complications    Given by: Chales Abrahams CMA Duncan Dull) (February 26, 2010 9:24 AM)  Orders Added: 1)  Vit B12 1000 mcg [J3420]

## 2010-08-07 NOTE — Assessment & Plan Note (Signed)
Summary: #1 of 3 weekly B12/266.2/dfs  Nurse Visit   Allergies: 1)  ! Pcn 2)  * Oxycodone  Medication Administration  Injection # 1:    Medication: Vit B12 1000 mcg    Diagnosis: B12 DEFICIENCY (ICD-266.2)    Route: IM    Site: L deltoid    Exp Date: 10/07/2011    Lot #: [id number]    Mfr: [employer]    Patient tolerated injection without complications    Given by: Harlow Mares CMA (AAMA) (February 19, 2010 9:59 AM)  Orders Added: 1)  Vit B12 1000 mcg [J3420]

## 2010-08-07 NOTE — Progress Notes (Signed)
Summary: [employer] records request  Records request received from the fax machine. Forwarded to [employer] for processing. Lenard Forth  Nov 10, 2009 4:38 PM

## 2010-08-07 NOTE — Miscellaneous (Signed)
Summary: Outpatient Coinsurance Notice  Outpatient Coinsurance Notice   Imported By: Marylou Mccoy 07/18/2009 15:46:13  _____________________________________________________________________  External Attachment:    Type:   Image     Comment:   External Document

## 2010-08-07 NOTE — Assessment & Plan Note (Signed)
Summary: FOLLOW UP-LB   Vital Signs:  Patient profile:   65 year old female Height:      63.5 inches Weight:      102 pounds BMI:     17.85 O2 Sat:      96 % on Room air Temp:     98.6 degrees F oral Pulse rate:   122 / minute BP sitting:   110 / 70  (left arm) Cuff size:   regular  Vitals Entered By: Zella Ball Ewing CMA Duncan Dull) (January 31, 2010 11:38 AM)  O2 Flow:  Room air  Preventive Care Screening  Last Pneumovax:    Date:  07/08/2006    Results:  given   CC: followup/RE   Primary Care Provider:  Corwin Levins MD  CC:  followup/RE.  History of Present Illness: here to c/o freq burpin, and odd taste to the back of the throat that tastes like one of her pills;  has had nausea and occas vomiting;  overall wt loss 6 lbs since feb;  denies frank reflux, or dysphagia, no abd pain but does have occas constipation;  no vomiting blood and no hematocheziea.  Pt denies CP, sob, doe, wheezing, orthopnea, pnd, worsening LE edema, palps, dizziness or syncope  Pt denies new neuro symptoms such as headache, facial or worsening extremity weakness No fever, wt loss, night sweats, loss of appetite or other constitutional symptoms  OVerall good med complaicne and good tolerability ectp for the above - she thinks it might be the metformin.  Did try the TUMS that helped some at the time.   Pt denies polydipsia, polyuria, or low sugar symptoms such as shakiness improved with eating.  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) 6)  Atrial Fibrillation  (ICD-427.31) 7)  Coronary Artery Disease  (ICD-414.00) 8)  Hyperlipidemia  (ICD-272.4) 9)  Hypertension  (ICD-401.9) 10)  Myocardial Infarction,  Acute  (ICD-410.90) 11)  Lung Nodule  (ICD-518.89) 12)  Diabetes Mellitus, Type I, Uncontrolled  (ICD-250.03) 13)  Back Pain  (ICD-724.5) 14)  Preventive Health Care  (ICD-V70.0) 15)  Goiter, Multinodular  (ICD-241.1) 16)  Anemia-nos  (ICD-285.9) 17)  Allergic Rhinitis  (ICD-477.9) 18)  Osteopenia  (ICD-733.90) 19)  Preventive Health Care  (ICD-V70.0) 20)  Vaginitis  (ICD-616.10) 21)  Ganglion Cyst, Wrist, Right  (ICD-727.41) 22)  Depression  (ICD-311) 23)  Anxiety  (ICD-300.00) 24)  Deep Venous Thrombophlebitis  (ICD-453.40) 25)  Paraplegia  (ICD-344.1)  Medications Prior to Update: 1)  Fexofenadine Hcl 180 Mg Tabs (Fexofenadine Hcl) .... Take 1 Tablet By Mouth Once A Day 2)  Metformin Hcl 500 Mg Tabs (Metformin Hcl) .... 2 By Mouth Two Times A Day 3)  Pravastatin Sodium 10 Mg Tabs (Pravastatin Sodium) .... Take 1 Tablet By Mouth Once A Day 4)  Nitroglycerin 0.4 Mg Subl (Nitroglycerin) .... One Tablet Under Tongue Every 5 Minutes As Needed For Chest Pain---May Repeat Times Three 5)  Folic Acid 1 Mg Tabs (Folic Acid) .Marland Kitchen.. 1 By Mouth Daily 6)  Novolog 100 Unit/ml Soln (Insulin Aspart) .... As Directed 7)  Iron 325 (65 Fe) Mg Tabs (Ferrous Sulfate) .Marland Kitchen.. 1 By Mouth Dialy 8)  Aspirin 81 Mg Tbec (Aspirin) .Marland KitchenMarland KitchenMarland Kitchen  Take One Tablet By Mouth Daily 9)  Citalopram Hydrobromide 10 Mg Tabs (Citalopram Hydrobromide) .Marland Kitchen.. 1 By Mouth Once Daily 10)  Flexeril 5 Mg Tabs (Cyclobenzaprine Hcl) .Marland Kitchen.. 1po Three Times A Day As Needed 11)  Tramadol Hcl 50 Mg Tabs (Tramadol Hcl) .... As Needed  Current Medications (verified): 1)  Fexofenadine Hcl 180 Mg Tabs (Fexofenadine Hcl) .... Take 1 Tablet By Mouth Once A Day 2)  Metformin Hcl 500 Mg Tabs (Metformin Hcl) .... 2 By Mouth Two Times A Day 3)  Pravastatin Sodium 10 Mg Tabs (Pravastatin Sodium) .... Take 1 Tablet By Mouth Once A Day 4)  Nitroglycerin 0.4 Mg Subl (Nitroglycerin) .... One Tablet Under Tongue Every 5 Minutes As Needed For Chest Pain---May Repeat  Times Three 5)  Folic Acid 1 Mg Tabs (Folic Acid) .Marland Kitchen.. 1 By Mouth Daily 6)  Novolog 100 Unit/ml Soln (Insulin Aspart) .... As Directed 7)  Iron 325 (65 Fe) Mg Tabs (Ferrous Sulfate) .Marland Kitchen.. 1 By Mouth Dialy 8)  Aspirin 81 Mg Tbec (Aspirin) .... Take One Tablet By Mouth Daily 9)  Citalopram Hydrobromide 10 Mg Tabs (Citalopram Hydrobromide) .Marland Kitchen.. 1 By Mouth Once Daily 10)  Flexeril 5 Mg Tabs (Cyclobenzaprine Hcl) .Marland Kitchen.. 1po Three Times A Day As Needed 11)  Tramadol Hcl 50 Mg Tabs (Tramadol Hcl) .... As Needed 12)  Omeprazole 20 Mg Cpdr (Omeprazole) .Marland Kitchen.. 1po Once Daily  Allergies (verified): 1)  ! Pcn 2)  * Oxycodone  Past History:  Past Medical History: Last updated: 08/10/2009 CORONARY ARTERY DISEASE (ICD-414.00)      a. s/p CABG x 2:  [place]-LAD, VG-RCA 03/30/2009 HYPERLIPIDEMIA (ICD-272.4) HYPERTENSION (ICD-401.9) MYOCARDIAL INFARCTION, ACUTE (ICD-410.90) LUNG NODULE (ICD-518.89) DIABETES MELLITUS, TYPE I, UNCONTROLLED (ICD-250.03) BACK PAIN (ICD-724.5)      a. 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 PREVENTIVE HEALTH CARE (ICD-V70.0) GOITER, MULTINODULAR (ICD-241.1) ANEMIA-NOS (ICD-285.9) ALLERGIC RHINITIS (ICD-477.9) OSTEOPENIA (ICD-733.90) PREVENTIVE HEALTH CARE (ICD-V70.0) VAGINITIS (ICD-616.10) GANGLION CYST, WRIST, RIGHT (ICD-727.41) DEPRESSION (ICD-311) ANXIETY (ICD-300.00) DEEP VENOUS THROMBOPHLEBITIS (ICD-453.40) PARAPLEGIA (ICD-344.1) Anemia-NOS  Past Surgical History: Last updated: 05/25/2009 Rotator cuff repair-R Caesarean section s/p right wrist ganglion cyst s/p right retinal detachment 2005 CABG - 03/2009 Lamitectomy-2010  Social History: Last updated: 03/11/2009 Never Smoked Alcohol use-no Divorced - lives alone 2 children work - Social worker  Risk Factors: Smoking Status: never (11/04/2007)  Review of Systems       all otherwise negative per pt -    Physical Exam  General:  alert and underweight appearing.   Head:   normocephalic and atraumatic.   Eyes:  vision grossly intact, pupils equal, and pupils round.   Ears:  R ear normal and L ear normal.   Nose:  no external deformity and no nasal discharge.   Mouth:  no gingival abnormalities and pharynx pink and moist.   Neck:  supple and no masses.   Lungs:  normal respiratory effort and normal breath sounds.   Heart:  normal rate and regular rhythm.   Abdomen:  soft, non-tender, and normal bowel sounds.   Extremities:  no edema, no erythema    Impression & Recommendations:  Problem # 1:  PROBLEMS WITH SMELL AND TASTE (ICD-V41.5) I suspect she is referring to some sour brash, though she denies obvious reflux symptoms and it is possible she is having some GI effect related to the metformin;  for now gave sample of dexilnat 60 mg from the office to try at  one per day;  incidently has appt tomorrow with  Dr Patterson/GI  Problem # 2:  HYPERTENSION (ICD-401.9)  BP today: 110/70 Prior BP: 106/78 (11/08/2009)  Labs Reviewed: K+: 3.9 (11/08/2009) Creat: : 0.9 (11/08/2009)   Chol: 162 (08/10/2009)   HDL: 83.90 (08/10/2009)   LDL: 61 (08/10/2009)   TG: 85.0 (08/10/2009) stable overall by hx and exam, ok to continue meds/tx as is   Problem # 3:  HYPERLIPIDEMIA (ICD-272.4)  Her updated medication list for this problem includes:    Pravastatin Sodium 10 Mg Tabs (Pravastatin sodium) .Marland Kitchen... Take 1 tablet by mouth once a day stable overall by hx and exam, ok to continue meds/tx as is   Labs Reviewed: SGOT: 17 (08/10/2009)   SGPT: 12 (08/10/2009)   HDL:83.90 (08/10/2009), 60.2 (09/09/2008)  LDL:61 (08/10/2009), 62 (09/09/2008)  Chol:162 (08/10/2009), 134 (09/09/2008)  Trig:85.0 (08/10/2009), 59 (09/09/2008)  Problem # 4:  DIABETES MELLITUS, TYPE I, UNCONTROLLED (ICD-250.03)  Her updated medication list for this problem includes:    Metformin Hcl 500 Mg Tabs (Metformin hcl) .Marland Kitchen... 2 by mouth two times a day    Novolog 100 Unit/ml Soln (Insulin aspart) .Marland Kitchen... As  directed    Aspirin 81 Mg Tbec (Aspirin) .Marland Kitchen... Take one tablet by mouth daily  Labs Reviewed: Creat: 0.9 (11/08/2009)    Reviewed HgBA1c results: 7.5 (08/10/2009)  10.5 (09/09/2008)  Orders: TLB-BMP (Basic Metabolic Panel-BMET) (80048-METABOL) TLB-A1C / Hgb A1C (Glycohemoglobin) (83036-A1C) TLB-Lipid Panel (80061-LIPID) improved last visit - to re-check today, cont same meds for now;  consider change metformin to [place] if dexilant not helping as above; Pt to cont DM diet, excercise,as able; to check labs today   Complete Medication List: 1)  Fexofenadine Hcl 180 Mg Tabs (Fexofenadine hcl) .... Take 1 tablet by mouth once a day 2)  Metformin Hcl 500 Mg Tabs (Metformin hcl) .... 2 by mouth two times a day 3)  Pravastatin Sodium 10 Mg Tabs (Pravastatin sodium) .... Take 1 tablet by mouth once a day 4)  Nitroglycerin 0.4 Mg Subl (Nitroglycerin) .... One tablet under tongue every 5 minutes as needed for chest pain---may repeat times three 5)  Folic Acid 1 Mg Tabs (Folic acid) .Marland Kitchen.. 1 by mouth daily 6)  Novolog 100 Unit/ml Soln (Insulin aspart) .... As directed 7)  Iron 325 (65 Fe) Mg Tabs (Ferrous sulfate) .Marland Kitchen.. 1 by mouth dialy 8)  Aspirin 81 Mg Tbec (Aspirin) .... Take one tablet by mouth daily 9)  Citalopram Hydrobromide 10 Mg Tabs (Citalopram hydrobromide) .Marland Kitchen.. 1 by mouth once daily 10)  Flexeril 5 Mg Tabs (Cyclobenzaprine hcl) .Marland Kitchen.. 1po three times a day as needed 11)  Tramadol Hcl 50 Mg Tabs (Tramadol hcl) .... As needed 12)  Omeprazole 20 Mg Cpdr (Omeprazole) .Marland Kitchen.. 1po once daily  Patient Instructions: 1)  please take the sample of the dexilant 60 mg - 1 per day 2)  after that, you are given the prescription for the generic prilosec 3)  Continue all previous medications as before this visit , although you may want to call in 2 to 3 days if not improved for [place] to consider changing the metformin as well  4)  please keep your appt with Dr Jarold Motto tomorrow 5)  Please go to the Lab in  the basement for your blood and/or urine tests today  6)  Please schedule a follow-up appointment in 7 months for your "yearly medicare exam" Prescriptions: OMEPRAZOLE 20 MG CPDR (OMEPRAZOLE) 1po once daily  #60 x 11   Entered and Authorized by:   Corwin Levins MD  Signed by:   Corwin Levins MD on 01/31/2010   Method used:   Print then Give to Patient   RxID:   [id number]

## 2010-08-07 NOTE — Progress Notes (Signed)
Summary: talk with Dr  Phone Note From Other Clinic   Summary of Call: Per Brayton Caves Dr Jeanell Sparrow ofc [phone] pt needs a peer review for her insurance and Dr Jeanell Sparrow needs to talk with Dr Riley Kill concerning this pt.  Initial call taken by: Edman Circle,  December 18, 2009 11:11 AM  Follow-up for Phone Call        I spoke with Brayton Caves and made her aware that Dr Riley Kill is out of the office this week on vacation.  She said that Dr Jeanell Sparrow had wanted to speak with Dr Riley Kill in regards to the records that were released to [facility] and then forwarded to Dr Jeanell Sparrow.  Brayton Caves said that she would have Dr Jeanell Sparrow go ahead and dictate a disability note on patient based on the records that they had obtained from [facility].  Dr Riley Kill does not have to call Dr Jeanell Sparrow.

## 2010-08-07 NOTE — Assessment & Plan Note (Signed)
Summary: ROV   Visit Type:  Follow-up Referring Provider:  t Kobe Jansma, md Primary Provider:  Corwin Levins MD  CC:  No cardiac complains.  History of Present Illness: Veronica Copeland is in for a follow up visit.  She remains wheel chair bound, and somewhat frail.  She has improved ffom  the standpoint of her ability to move, and she is not cardiac disabled, but it is hard to see how she could return to any sustained work activity given her limitations.  She looks frail.  She also remains on iron at present.  Denies chest pain currently.  Current Medications (verified): 1)  Fexofenadine Hcl 180 Mg Tabs (Fexofenadine Hcl) .... Take 1 Tablet By Mouth Once A Day 2)  Metformin Hcl 500 Mg Tabs (Metformin Hcl) .... 2 By Mouth Two Times A Day 3)  Pravastatin Sodium 10 Mg Tabs (Pravastatin Sodium) .... Take 1 Tablet By Mouth Once A Day 4)  Nitroglycerin 0.4 Mg Subl (Nitroglycerin) .... One Tablet Under Tongue Every 5 Minutes As Needed For Chest Pain---May Repeat Times Three 5)  Folic Acid 1 Mg Tabs (Folic Acid) .Marland Kitchen.. 1 By Mouth Daily 6)  Novolog 100 Unit/ml Soln (Insulin Aspart) .... As Directed 7)  Iron 325 (65 Fe) Mg Tabs (Ferrous Sulfate) .Marland Kitchen.. 1 By Mouth Dialy 8)  Aspirin 81 Mg Tbec (Aspirin) .... Take One Tablet By Mouth Daily 9)  Citalopram Hydrobromide 10 Mg Tabs (Citalopram Hydrobromide) .Marland Kitchen.. 1 By Mouth Once Daily 10)  Flexeril 5 Mg Tabs (Cyclobenzaprine Hcl) .Marland Kitchen.. 1po Three Times A Day As Needed 11)  Tramadol Hcl 50 Mg Tabs (Tramadol Hcl) .... As Needed  Allergies: 1)  ! Pcn 2)  * Oxycodone  Vital Signs:  Patient profile:   65 year old female Height:      63.5 inches Weight:      102.75 pounds BMI:     17.98 Pulse rate:   100 / minute Pulse rhythm:   regular Resp:     18 per minute BP sitting:   106 / 78  (left arm) Cuff size:   regular  Vitals Entered By: Vikki Ports (Nov 08, 2009 9:24 AM)  Physical Exam  General:  Frail older women, but with a smile. Head:  normocephalic and  atraumatic Eyes:  PERRLA/EOM intact; conjunctiva and lids normal. Neck:  No carotid bruits. Lungs:  Clear lungs, but with slight decrease in breath sounds.  Heart:  PMI non displaced.  Minimal SEM.  No DM.  Pos S4.  No rub Msk:  Wheel chair bound.     EKG  Procedure date:  11/08/2009  Findings:      NSR.  Low voltage QRS.  CT Scan  Procedure date:  09/18/2009  Findings:      IMPRESSION:   1.  Right lower lobe nodule is stable from 03/15/2009.  Follow-up in 12 months can be performed to ensure continued stability. This recommendation follows the consensus statement: 'Guidelines for Management of Small Pulmonary Nodules Detected on CT Scans:  A Statement from the Fleischner Society' as published in Radiology 2005; 237:395-400.  Available online at: DietDisorder.cz. 2.  Thickening of the distal esophageal wall can be seen with gastroesophageal reflux disease. 3.  Gallbladder sludge.   Read By:  Reyes Ivan.,  M.D.     Released By:  Reyes Ivan.,  M.D.  _____________________________________________________________________  External Attachment:    Type:     Image     Comment:  CT CHEST LIMITED W/O  CM - [id number]  Signed by Ronaldo Miyamoto, MD, [facility] on 09/20/2009 at 10:24 PM  Signed by Ronaldo Miyamoto, MD, [facility] on 10/29/2009 at 10:27 PM ________________________________________________________________________ I will call patient to discuss further workup.  Plan review with Dr. Jonny Ruiz   Signed by Ronaldo Miyamoto, MD, [facility] on 09/20/2009 at 10:24 PM  ________________________________________________________________________  Impression & Recommendations:  Problem # 1:  CAD, ARTERY BYPASS GRAFT (ICD-414.04) No recurrent chest pain following CABG.  Overall Stable from this standpoint. The following medications were removed from the medication list:    Metoprolol Tartrate 25 Mg Tabs (Metoprolol tartrate) .Marland Kitchen...  Take one tablet by mouth twice a day Her updated medication list for this problem includes:    Nitroglycerin 0.4 Mg Subl (Nitroglycerin) ..... One tablet under tongue every 5 minutes as needed for chest pain---may repeat times three    Aspirin 81 Mg Tbec (Aspirin) .Marland Kitchen... Take one tablet by mouth daily  Orders: TLB-BMP (Basic Metabolic Panel-BMET) (80048-METABOL) TLB-CBC Platelet - w/Differential (85025-CBCD) TLB-TSH (Thyroid Stimulating Hormone) (84443-TSH)  Problem # 2:  SINUS TACHYCARDIA (ICD-427.89) Borderline heart rate at this point, with borderline BP as well.  Currently off beta blockers.   The following medications were removed from the medication list:    Metoprolol Tartrate 25 Mg Tabs (Metoprolol tartrate) .Marland Kitchen... Take one tablet by mouth twice a day Her updated medication list for this problem includes:    Nitroglycerin 0.4 Mg Subl (Nitroglycerin) ..... One tablet under tongue every 5 minutes as needed for chest pain---may repeat times three    Aspirin 81 Mg Tbec (Aspirin) .Marland Kitchen... Take one tablet by mouth daily  Orders: Gastroenterology Referral (GI)  Problem # 3:  HYPERLIPIDEMIA (ICD-272.4) Currently on statin as tolerated.  Will encourage followoup with her primary MD.   Her updated medication list for this problem includes:    Pravastatin Sodium 10 Mg Tabs (Pravastatin sodium) .Marland Kitchen... Take 1 tablet by mouth once a day  Orders: TLB-BMP (Basic Metabolic Panel-BMET) (80048-METABOL) TLB-CBC Platelet - w/Differential (85025-CBCD) TLB-TSH (Thyroid Stimulating Hormone) (84443-TSH)  Problem # 4:  LUNG NODULE (ICD-518.89) will need followup CT as noted.  Would defer to Dr. Jonny Ruiz in the future.  Problem # 5:  ANEMIA-NOS (ICD-285.9) She remains anemic on iron with borderline indices.  CT suggests poss of esophageal reflux with thickening.  Also has gallbladder sludge.  Will refer to the GI service for opinion.  She may require endo.  See iron studies.  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  Followed by Dr. [name] ? status.  I do not see how she can productively work daily.   Problem # 7:  PERIPHERAL EDEMA (ICD-782.3) Left leg edema--encouraged to keep elevated.    Patient Instructions: 1)  Your physician recommends that you schedule a follow-up appointment in: 2 MONTHS with Dr Riley Kill 2)  Your physician recommends that you have lab work today: CBC, BMP, TSH 3)  You have been referred to GI for anemia work-up and thickening of esophagus. 4)  Please schedule follow-up appointment with Dr Jonny Ruiz to address primary care issues.  5)  Your physician recommends that you continue on your current medications as directed. Please refer to the Current Medication list given to you today.

## 2010-08-07 NOTE — Assessment & Plan Note (Addendum)
Summary: ROV   Visit Type:  Follow-up Referring Provider:  t Jeliyah Middlebrooks, md Primary Provider:  Corwin Levins MD  CC:  Left leg edema.  History of Present Illness: [name] is in for followup.  She remains wheel chair bound, but is slowly and gradually improving.  She thinks she has had follow up with Dr. [name].  Denies any chest pain.  Sternum is healing.  HR remains somewhat fast.    Current Medications (verified): 1)  Fexofenadine Hcl 180 Mg Tabs (Fexofenadine Hcl) .... Take 1 Tablet By Mouth Once A Day 2)  Metformin Hcl 500 Mg Tabs (Metformin Hcl) .... 2 By Mouth Two Times A Day 3)  Pravastatin Sodium 10 Mg Tabs (Pravastatin Sodium) .... Take 1 Tablet By Mouth Once A Day 4)  Nitroglycerin 0.4 Mg Subl (Nitroglycerin) .... One Tablet Under Tongue Every 5 Minutes As Needed For Chest Pain---May Repeat Times Three 5)  Folic Acid 1 Mg Tabs (Folic Acid) .Marland Kitchen.. 1 By Mouth Daily 6)  Novolog 100 Unit/ml Soln (Insulin Aspart) .... As Directed 7)  Iron 325 (65 Fe) Mg Tabs (Ferrous Sulfate) .Marland Kitchen.. 1 By Mouth Dialy 8)  Metoprolol Tartrate 25 Mg Tabs (Metoprolol Tartrate) .... Take One Tablet By Mouth Twice A Day 9)  Aspirin 81 Mg Tbec (Aspirin) .... Take One Tablet By Mouth Daily 10)  Novolin 70/30 70-30 % Susp (Insulin Isophane & Regular) .... Take As Directed 11)  Hydrochlorothiazide 12.5 Mg Caps (Hydrochlorothiazide) .Marland Kitchen.. 1p O Once Daily 12)  Citalopram Hydrobromide 10 Mg Tabs (Citalopram Hydrobromide) .Marland Kitchen.. 1 By Mouth Once Daily 13)  Tramadol Hcl 50 Mg Tabs (Tramadol Hcl) .Marland Kitchen.. 1 - 2 By Mouth Qid As Needed Pain 14)  Flexeril 5 Mg Tabs (Cyclobenzaprine Hcl) .Marland Kitchen.. 1po Three Times A Day As Needed  Allergies: 1)  ! Pcn 2)  * Oxycodone  Vital Signs:  Patient profile:   65 year old female Height:      63.5 inches Weight:      102.50 pounds BMI:     17.94 Pulse rate:   111 / minute Pulse rhythm:   irregular Resp:     18 per minute BP sitting:   100 / 70  (right arm) Cuff size:   large  Vitals  Entered By: Vikki Ports (September 18, 2009 11:04 AM)  Physical Exam  General:  Thin, chronically ill appearing female in no acute distress.   Sitting in wheel chair.   Head:  normocephalic and atraumatic Eyes:  PERRLA/EOM intact; conjunctiva and lids normal. Lungs:  No definite rales, but some decrease in overall breath sounds related to excursion, likely.   Heart:  Distant.  Normal S1 and S2.  Minimal SEM.  No DM.  No rub on exam. Extremities:  No clubbing or cyanosis.   Some LLE edema.  Diminished pulses bilaterally.  Neurologic:  Alert and oriented x 3.  In wheel chair.  (spinal chord tumor resection).     CT Scan  Procedure date:  09/18/2009  Findings:      Comparison: 05/26/2009 and 03/15/2009  Findings: Heart size normal.  No pericardial effusion.  Thickening of the distal esophageal wall.  A 7 mm nodule in the right lower lobe (image 15) is grossly stable from 03/15/2009.  Subpleural scarring in the right lower lobe. Visualized portions of the lungs are otherwise unremarkable.  No pleural fluid.  Incidental imaging of the upper abdomen shows high attenuation material nearly filling the gallbladder.  Extensive arterial vascular calcifications.  A 1  cm low attenuation lesion in the left kidney is likely a cyst.  No worrisome lytic or sclerotic lesions.  IMPRESSION:  1.  Right lower lobe nodule is stable from 03/15/2009.  Follow-up in 12 months can be performed to ensure continued stability. This recommendation follows the consensus statement: 'Guidelines for Management of Small Pulmonary Nodules Detected on CT Scans:  A Statement from the Fleischner Society' as published in Radiology 2005; 237:395-400.  Available online at: [email or web address].cz. 2.  Thickening of the distal esophageal wall can be seen with gastroesophageal reflux disease. 3.  Gallbladder sludge.  Read By:  Reyes Ivan.,  M.D.     Released By:  Reyes Ivan.,  M.D.  EKG  Procedure date:  09/18/2009  Findings:      Sinus tachycardia.  Low voltage QRS.  Nonspecific T flattening.   Impression & Recommendations:  Problem # 1:  CAD, ARTERY BYPASS GRAFT (ICD-414.04) Appears stable post op.  HR remains a little high, but we will continue to monitor this.  No chest pain.  CAD stable as a result.   Her updated medication list for this problem includes:    Nitroglycerin 0.4 Mg Subl (Nitroglycerin) ..... One tablet under tongue every 5 minutes as needed for chest pain---may repeat times three    Metoprolol Tartrate 25 Mg Tabs (Metoprolol tartrate) .Marland Kitchen... Take one tablet by mouth twice a day    Aspirin 81 Mg Tbec (Aspirin) .Marland Kitchen... Take one tablet by mouth daily  Problem # 2:  PERIPHERAL EDEMA (ICD-782.3) LLE edema noted.  Venous dopplers are negative for DVT.  Had some enlarged lymph nodes noted on doppler.  Will reassess at next office visit.   Problem # 3:  HYPERLIPIDEMIA (ICD-272.4) Will need monitoring of lab studies. Her updated medication list for this problem includes:    Pravastatin Sodium 10 Mg Tabs (Pravastatin sodium) .Marland Kitchen... 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 Has followup scheduled with Dr. [name] to continue to monitor.   Problem # 5:  HYPERTENSION (ICD-401.9) Very borderline.  Will reduce dose of HCTZ for now given BP. Her updated medication list for this problem includes:    Metoprolol Tartrate 25 Mg Tabs (Metoprolol tartrate) .Marland Kitchen... Take one tablet by mouth twice a day    Aspirin 81 Mg Tbec (Aspirin) .Marland Kitchen... Take one tablet by mouth daily    Hydrochlorothiazide 12.5 Mg Caps (Hydrochlorothiazide) .Marland Kitchen... Take one-half tablet daily  Orders: EKG w/ Interpretation (93000) TLB-BMP (Basic Metabolic  Panel-BMET) (80048-METABOL) TLB-CBC Platelet - w/Differential (85025-CBCD)  Problem # 6:  SINUS TACHYCARDIA (ICD-427.89) Heart rate is faster.  Not quite sure of the reason.  No symptoms of PE.  No rub.  Recent echo unremarkable.  Uncertain as to if she is getting metoprolol, but seems to be.  Was slower before.  Perhaps have home health check pulse there. Her updated medication list for this problem includes:    Nitroglycerin 0.4 Mg Subl (Nitroglycerin) ..... One tablet under tongue every 5 minutes as needed for chest pain---may repeat times three    Metoprolol Tartrate 25 Mg Tabs (Metoprolol tartrate) .Marland Kitchen... Take one tablet by mouth twice a day    Aspirin 81 Mg Tbec (Aspirin) .Marland Kitchen... Take one tablet by mouth daily  Patient Instructions: 1)  Your physician recommends that you schedule a follow-up appointment in: 6 WEEKS 2)  Your physician recommends that you have lab work today: CBC, BMP 3)  Your physician has recommended you make the following change in your medication: DECREASE HCTZ 12.5mg  to one-half tablet daily   Appended Document: ROV The pt no longer has home health services.  I attempted to teach the pt how to check her pulse (radial) over the phone, but the pt was unable to do this activity.  I asked the pt to check her pulse if she goes to a local pharmacy.  Pt agreed and will call with results.

## 2010-08-07 NOTE — Letter (Signed)
Summary: [facility]  [facility]   Imported By: [facility] 09/21/2009 08:51:38  _____________________________________________________________________  External Attachment:    Type:   Image     Comment:   External Document

## 2010-08-07 NOTE — Assessment & Plan Note (Signed)
Summary: 1 month f/u // #?cd   Vital Signs:  Patient profile:   65 year old female Height:      63.5 inches Weight:      108 pounds BMI:     18.90 O2 Sat:      99 % on Room air Temp:     97.7 degrees F oral Pulse rate:   107 / minute BP sitting:   130 / 70  (left arm) Cuff size:   regular  Vitals Entered ByZella Ball Ewing (August 10, 2009 9:27 AM)  O2 Flow:  Room air CC: 1 Mo ROV/RE   Primary Care Provider:  Corwin Levins MD  CC:  1 Mo ROV/RE.  History of Present Illness: here to f/u - recent oxycdone caused dizziness so had to stop;  ins wont pay for benzo further;  baclofen no real help for the spasms to upper leg;  edema improved iwth diuretic;  needs lazy boy recliner for leg elevation and to help stand;  Pt denies CP, sob, doe, wheezing, orthopnea, pnd, worsening LE edema, palps, dizziness or syncope  Pt denies other new neuro symptoms such as headache, facial or extremity weakness Pt denies polydipsia, polyuria, or low sugar symptoms such as shakiness improved with eating.  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) 3)  Atrial Fibrillation  (ICD-427.31) 4)  Coronary Artery Disease  (ICD-414.00) 5)  Hyperlipidemia  (ICD-272.4) 6)  Hypertension  (ICD-401.9) 7)  Myocardial Infarction, Acute  (ICD-410.90) 8)  Lung Nodule  (ICD-518.89) 9)  Diabetes Mellitus, Type I, Uncontrolled  (ICD-250.03) 10)  Back Pain  (ICD-724.5) 11)  Preventive Health Care  (ICD-V70.0) 12)  Goiter, Multinodular   (ICD-241.1) 13)  Anemia-nos  (ICD-285.9) 14)  Allergic Rhinitis  (ICD-477.9) 15)  Osteopenia  (ICD-733.90) 16)  Preventive Health Care  (ICD-V70.0) 17)  Vaginitis  (ICD-616.10) 18)  Ganglion Cyst, Wrist, Right  (ICD-727.41) 19)  Depression  (ICD-311) 20)  Anxiety  (ICD-300.00) 21)  Deep Venous Thrombophlebitis  (ICD-453.40) 22)  Paraplegia  (ICD-344.1)  Medications Prior to Update: 1)  Fexofenadine Hcl 180 Mg Tabs (Fexofenadine Hcl) .... Take 1 Tablet By Mouth Once A Day 2)  Meclizine Hcl 25 Mg Tabs (Meclizine Hcl) .... Take 1 Tablet By Mouth Four Times A Day 3)  Metformin Hcl 500 Mg Tabs (Metformin Hcl) .... 2 By Mouth Qam and 1 By Mouth At Bedtime 4)  Pravastatin Sodium 10 Mg Tabs (Pravastatin Sodium) .... Take 1 Tablet By Mouth Once A Day 5)  Nitroglycerin 0.4 Mg Subl (Nitroglycerin) .... One Tablet Under Tongue Every 5 Minutes As Needed For Chest Pain---May Repeat Times Three 6)  Folic Acid 1 Mg Tabs (Folic Acid) .Marland Kitchen.. 1 By Mouth Daily 7)  Novolog 100 Unit/ml Soln (Insulin Aspart) .... As Directed 8)  Iron 325 (65 Fe) Mg Tabs (Ferrous Sulfate) .Marland Kitchen.. 1 By Mouth Dialy 9)  Alprazolam 0.5 Mg Tabs (Alprazolam) .Marland Kitchen.. 1 By Mouth Q6 Hours 10)  Diazepam 5 Mg Tabs (Diazepam) .... As Needed 11)  Oxycodone Hcl 5 Mg Tabs (Oxycodone Hcl) .Marland Kitchen.. 1 Po Q  6 Hrs As Needed 12)  Robaxin 500 Mg Tabs (Methocarbamol) .Marland Kitchen.. 1 By Mouth Two Times A Day As Needed 13)  Metoprolol Tartrate 25 Mg Tabs (Metoprolol Tartrate) .... Take One Tablet By Mouth Twice A Day 14)  Aspirin 81 Mg Tbec (Aspirin) .... Take One Tablet By Mouth Daily 15)  Novolin 70/30 70-30 % Susp (Insulin Isophane & Regular) .... Take As Directed 16)  Hydrochlorothiazide 12.5 Mg Caps (Hydrochlorothiazide) .Marland Kitchen.. 1p O Once Daily 17)  Baclofen 10 Mg Tabs (Baclofen) .Marland Kitchen.. 1 By Mouth Two Times A Day As Needed Spasm  Current Medications (verified): 1)  Fexofenadine Hcl 180 Mg Tabs (Fexofenadine Hcl) .... Take 1 Tablet By Mouth Once A Day 2)  Metformin Hcl  500 Mg Tabs (Metformin Hcl) .... 2 By Mouth Qam and 1 By Mouth At Bedtime 3)  Pravastatin Sodium 10 Mg Tabs (Pravastatin Sodium) .... Take 1 Tablet By Mouth Once A Day 4)  Nitroglycerin 0.4 Mg Subl (Nitroglycerin) .... One Tablet Under Tongue Every 5 Minutes As Needed For Chest Pain---May Repeat Times Three 5)  Folic Acid 1 Mg Tabs (Folic Acid) .Marland Kitchen.. 1 By Mouth Daily 6)  Novolog 100 Unit/ml Soln (Insulin Aspart) .... As Directed 7)  Iron 325 (65 Fe) Mg Tabs (Ferrous Sulfate) .Marland Kitchen.. 1 By Mouth Dialy 8)  Robaxin 500 Mg Tabs (Methocarbamol) .Marland Kitchen.. 1 By Mouth Two Times A Day As Needed 9)  Metoprolol Tartrate 25 Mg Tabs (Metoprolol Tartrate) .... Take One Tablet By Mouth Twice A Day 10)  Aspirin 81 Mg Tbec (Aspirin) .... Take One Tablet By Mouth Daily 11)  Novolin 70/30 70-30 % Susp (Insulin Isophane & Regular) .... Take As Directed 12)  Hydrochlorothiazide 12.5 Mg Caps (Hydrochlorothiazide) .Marland Kitchen.. 1p O Once Daily 13)  Citalopram Hydrobromide 10 Mg Tabs (Citalopram Hydrobromide) .Marland Kitchen.. 1 By Mouth Once Daily 14)  Tramadol Hcl 50 Mg Tabs (Tramadol Hcl) .Marland Kitchen.. 1 - 2 By Mouth Qid As Needed Pain 15)  Flexeril 5 Mg Tabs (Cyclobenzaprine Hcl) .Marland Kitchen.. 1po Three Times A Day As Needed  Allergies (verified): 1)  ! Pcn 2)  * Oxycodone  Past History:  Past Surgical History: Last updated: 05/25/2009 Rotator cuff repair-R Caesarean section s/p right wrist ganglion cyst s/p right retinal detachment 2005 CABG - 03/2009 Lamitectomy-2010  Family History: Last updated: 03/11/2009 DM--2 sibs HTN heart disease grandmother with stroke a sister has a large goiter Positive for CAD and DM  Social History: Last updated: 03/11/2009 Never Smoked Alcohol use-no Divorced - lives alone 2 children work - Social worker  Risk Factors: Smoking Status: never (11/04/2007)  Past Medical History: CORONARY ARTERY DISEASE (ICD-414.00)      a. s/p CABG x 2:  [place]-LAD, VG-RCA 03/30/2009 HYPERLIPIDEMIA  (ICD-272.4) HYPERTENSION (ICD-401.9) MYOCARDIAL INFARCTION, ACUTE (ICD-410.90) LUNG NODULE (ICD-518.89) DIABETES MELLITUS, TYPE I, UNCONTROLLED (ICD-250.03) BACK PAIN (ICD-724.5)      a. 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 PREVENTIVE HEALTH CARE (ICD-V70.0) GOITER, MULTINODULAR (ICD-241.1) ANEMIA-NOS (ICD-285.9) ALLERGIC RHINITIS (ICD-477.9) OSTEOPENIA (ICD-733.90) PREVENTIVE HEALTH CARE (ICD-V70.0) VAGINITIS (ICD-616.10) GANGLION CYST, WRIST, RIGHT (ICD-727.41) DEPRESSION (ICD-311) ANXIETY (ICD-300.00) DEEP VENOUS THROMBOPHLEBITIS (ICD-453.40) PARAPLEGIA (ICD-344.1) Anemia-NOS  Review of Systems  The patient denies anorexia, fever, weight loss, vision loss, decreased hearing, hoarseness, chest pain, syncope, dyspnea on exertion, peripheral edema, prolonged cough, headaches, hemoptysis, abdominal pain, melena, hematochezia, severe indigestion/heartburn, hematuria, incontinence, muscle weakness, suspicious skin lesions, transient blindness, difficulty walking, unusual weight change, abnormal bleeding, enlarged lymph nodes, and angioedema.         all otherwise  negative per pt -  Physical Exam  General:  alert and underweight appearing.   Head:  normocephalic and atraumatic.   Eyes:  vision grossly intact, pupils equal, and pupils round.   Ears:  R ear normal and L ear normal.   Nose:  no external deformity and no nasal discharge.   Mouth:  no gingival abnormalities and pharynx pink and moist.   Neck:  supple and no masses.   Lungs:  normal respiratory effort and normal breath sounds.   Heart:  normal rate and regular rhythm.   Abdomen:  soft, non-tender, and normal bowel sounds.   Msk:  no joint tenderness and no joint swelling.   Extremities:  .nonon  Neurologic:  cranial nerves II-XII intact and strength normal in all extremities.     Impression & Recommendations:  Problem # 1:  Preventive Health Care (ICD-V70.0)  Overall doing well, age  appropriate education and counseling updated and referral for appropriate preventive services done unless declined, immunizations up to date or declined, diet counseling done if overweight, urged to quit smoking if smokes , most recent labs reviewed and current ordered if appropriate, ecg reviewed or declined (interpretation per ECG scanned in the EMR if done); information regarding Medicare Prevention requirements given if appropriate   Orders: T-Vitamin D (25-Hydroxy) (16109-60454) TLB-BMP (Basic Metabolic Panel-BMET) (80048-METABOL) TLB-CBC Platelet - w/Differential (85025-CBCD) TLB-Hepatic/Liver Function Pnl (80076-HEPATIC) TLB-Lipid Panel (80061-LIPID) TLB-TSH (Thyroid Stimulating Hormone) (84443-TSH) TLB-Udip ONLY (81003-UDIP) First annual wellness visit with prevention plan  (U9811)  Problem # 2:  DIABETES MELLITUS, TYPE I, UNCONTROLLED (ICD-250.03)  Her updated medication list for this problem includes:    Metformin Hcl 500 Mg Tabs (Metformin hcl) .Marland Kitchen... 2 by mouth qam and 1 by mouth at bedtime    Novolog 100 Unit/ml Soln (Insulin aspart) .Marland Kitchen... As directed    Aspirin 81 Mg Tbec (Aspirin) .Marland Kitchen... Take one tablet by mouth daily    Novolin 70/30 70-30 % Susp (Insulin isophane & regular) .Marland Kitchen... Take as directed  Labs Reviewed: Creat: 1.0 (05/25/2009)    Reviewed HgBA1c results: 10.5 (09/09/2008)  12.7 (03/10/2008)  Orders: TLB-Microalbumin/Creat Ratio, Urine (82043-MALB) TLB-A1C / Hgb A1C (Glycohemoglobin) (83036-A1C) stable overall by hx and exam, ok to continue meds/tx as is , .Pt to cont DM diet, excercise, wt loss efforts; to check labs today   Problem # 3:  ANXIETY (ICD-300.00)  The following medications were removed from the medication list:    Alprazolam 0.5 Mg Tabs (Alprazolam) .Marland Kitchen... 1 by mouth q6 hours    Diazepam 5 Mg Tabs (Diazepam) .Marland Kitchen... As needed Her updated medication list for this problem includes:    Citalopram Hydrobromide 10 Mg Tabs (Citalopram hydrobromide)  .Marland Kitchen... 1 by mouth once daily insuarance wont pay for benzo - to change to citalopram  Problem # 4:  ANEMIA-NOS (ICD-285.9)  Her updated medication list for this problem includes:    Folic Acid 1 Mg Tabs (Folic acid) .Marland Kitchen... 1 by mouth daily    Iron 325 (65 Fe) Mg Tabs (Ferrous sulfate) .Marland Kitchen... 1 by mouth dialy  Orders: TLB-IBC Pnl (Iron/FE;Transferrin) (83550-IBC) TLB-B12 + Folate Pnl (82746_82607-B12/FOL) asympt, no overt bleeding, for f/u labs  Problem # 5:  HYPERTENSION (ICD-401.9)  Her updated medication list for this problem includes:    Metoprolol Tartrate 25 Mg Tabs (Metoprolol tartrate) .Marland Kitchen... Take one tablet by mouth twice a day    Hydrochlorothiazide 12.5 Mg Caps (Hydrochlorothiazide) .Marland Kitchen... 1p o once daily  BP today: 130/70 Prior BP: 110/59 (07/13/2009)  Labs Reviewed:  K+: 4.0 (05/25/2009) Creat: : 1.0 (05/25/2009)   Chol: 134 (09/09/2008)   HDL: 60.2 (09/09/2008)   LDL: 62 (09/09/2008)   TG: 59 (09/09/2008) stable overall by hx and exam, ok to continue meds/tx as is   Complete Medication List: 1)  Fexofenadine Hcl 180 Mg Tabs (Fexofenadine hcl) .... Take 1 tablet by mouth once a day 2)  Metformin Hcl 500 Mg Tabs (Metformin hcl) .... 2 by mouth qam and 1 by mouth at bedtime 3)  Pravastatin Sodium 10 Mg Tabs (Pravastatin sodium) .... Take 1 tablet by mouth once a day 4)  Nitroglycerin 0.4 Mg Subl (Nitroglycerin) .... One tablet under tongue every 5 minutes as needed for chest pain---may repeat times three 5)  Folic Acid 1 Mg Tabs (Folic acid) .Marland Kitchen.. 1 by mouth daily 6)  Novolog 100 Unit/ml Soln (Insulin aspart) .... As directed 7)  Iron 325 (65 Fe) Mg Tabs (Ferrous sulfate) .Marland Kitchen.. 1 by mouth dialy 8)  Robaxin 500 Mg Tabs (Methocarbamol) .Marland Kitchen.. 1 by mouth two times a day as needed 9)  Metoprolol Tartrate 25 Mg Tabs (Metoprolol tartrate) .... Take one tablet by mouth twice a day 10)  Aspirin 81 Mg Tbec (Aspirin) .... Take one tablet by mouth daily 11)  Novolin 70/30 70-30 % Susp  (Insulin isophane & regular) .... Take as directed 12)  Hydrochlorothiazide 12.5 Mg Caps (Hydrochlorothiazide) .Marland Kitchen.. 1p o once daily 13)  Citalopram Hydrobromide 10 Mg Tabs (Citalopram hydrobromide) .Marland Kitchen.. 1 by mouth once daily 14)  Tramadol Hcl 50 Mg Tabs (Tramadol hcl) .Marland Kitchen.. 1 - 2 by mouth qid as needed pain 15)  Flexeril 5 Mg Tabs (Cyclobenzaprine hcl) .Marland Kitchen.. 1po three times a day as needed  Other Orders: Prescription Created Electronically [id number])  Patient Instructions: 1)  you are given the prescription for the lazy boy chair and donut - consider [facility] on [address]  2)  stop the baclofen 3)  stop the alprazolam and diazepam 4)  start the citalopram 10 mg per day 5)  please remember to followup in March with Dr Riley Kill as you will be due for CT scan at that time as well 6)  stop the oxycodone as you have 7)  start the tramadol as prescribed 8)  stop the methocarbamol for muscle spasm 9)  start the flexeril generic for muscles instead as needed  10)  Please go to the Lab in the basement for your blood and/or urine tests today  11)  Please schedule a follow-up appointment in 6 months with : 12)  BMP prior to visit, ICD-9: 250.02 13)  Lipid Panel prior to visit, ICD-9: 14)  HbgA1C prior to visit, ICD-9: Prescriptions: FLEXERIL 5 MG TABS (CYCLOBENZAPRINE HCL) 1po three times a day as needed  #90 x 3   Entered and Authorized by:   Corwin Levins MD   Signed by:   Corwin Levins MD on 08/10/2009   Method used:   Print then Give to Patient   RxID:   [id number] TRAMADOL HCL 50 MG TABS (TRAMADOL HCL) 1 - 2 by mouth qid as needed pain  #240 x 2   Entered and Authorized by:   Corwin Levins MD   Signed by:   Corwin Levins MD on 08/10/2009   Method used:   Print then Give to Patient   RxID:   [id number] CITALOPRAM HYDROBROMIDE 10 MG TABS (CITALOPRAM HYDROBROMIDE) 1 by mouth once daily  #90 x 3   Entered and Authorized by:   Fayrene Fearing  Ellin Mayhew MD   Signed by:   Corwin Levins MD  on 08/10/2009   Method used:   Print then Give to Patient   RxID:   [id number]   Appended Document: 1 month f/u // #?cd Append:  correction to Neuro exam above:  pt in wheelchair;  LE strength, sensation and DTR's and gait not tested

## 2010-08-07 NOTE — Progress Notes (Signed)
  Phone Note Refill Request  on July 31, 2009 2:52 PM  Refills Requested: Medication #1:  METFORMIN HCL 500 MG TABS 2 by mouth qam and 1 by mouth at bedtime   Dosage confirmed as above?Dosage Confirmed   Notes[facility] [address] Initial call taken by: Scharlene Gloss,  July 31, 2009 2:52 PM    Prescriptions: IRON 325 (65 FE) MG TABS (FERROUS SULFATE) 1 by mouth dialy  #30 x 3   Entered by:   Scharlene Gloss   Authorized by:   Corwin Levins MD   Signed by:   Scharlene Gloss on 07/31/2009   Method used:   [facility]ed to ...       [facility]  [address] [phone][facility] (retail)       [address]       [place], [place]  [address]       Ph: [phone] or [phone]       Fax: [phone]   RxID:   [id number] METFORMIN HCL 500 MG TABS (METFORMIN HCL) 2 by mouth qam and 1 by mouth at bedtime  #90 x 3   Entered by:   Scharlene Gloss   Authorized by:   Corwin Levins MD   Signed by:   Scharlene Gloss on 07/31/2009   Method used:   Faxed to ...       [facility] [phone]* (retail)       [address]       [place], [place]  [address]       Ph: [phone] or [phone]       Fax: [phone]   RxID:   [id number]

## 2010-08-07 NOTE — Progress Notes (Signed)
Summary: Medication refill  Phone Note Call from Patient Call back at Home Phone [phone]   Caller: Patient Call For: Dr. Jarold Motto Reason for Call: Talk to Nurse Summary of Call: Needs refill for her Vitamin b12 injections....[facility] [address] Initial call taken by: Karna Christmas,  April 30, 2010 4:14 PM  Follow-up for Phone Call        wants to do b12 injections not nasal spray. Follow-up by: Harlow Mares CMA Duncan Dull),  April 30, 2010 4:42 PM    New/Updated Medications: CYANOCOBALAMIN 1000 MCG/ML SOLN (CYANOCOBALAMIN) inject one ML IM once a month. TERUMO SURGUARD2 SYRINGE 25G X 1" 3 ML MISC (SYRINGE/NEEDLE (DISP)) use with b12 injections Prescriptions: TERUMO SURGUARD2 SYRINGE 25G X 1" 3 ML MISC (SYRINGE/NEEDLE (DISP)) use with b12 injections  #12 x 0   Entered by:   Harlow Mares CMA (AAMA)   Authorized by:   Mardella Layman MD [facility]   Signed by:   Harlow Mares CMA (AAMA) on 04/30/2010   Method used:   [facility]ronically to        [facility] [phone][facility] (retail)       [address]       [place], [place]  [address]       Ph: [phone] or [phone]       Fax: [phone]   RxID:   [id number] CYANOCOBALAMIN 1000 MCG/ML SOLN (CYANOCOBALAMIN) inject one ML IM once a month.  #1 multidose x 1   Entered by:   Harlow Mares CMA (AAMA)   Authorized by:   Mardella Layman MD [facility]   Signed by:   Harlow Mares CMA (AAMA) on 04/30/2010   Method used:   [facility]ron[facility] to        [facility]  [address] [phone][facility] (retail)       [address]       [place], [place]  [address]       Ph: [phone] or [phone]       Fax: [phone]   RxID:   [id number]

## 2010-08-07 NOTE — Letter (Signed)
Summary: New Patient letter  [facility]  [address] [place], [place] [address]   Phone: [phone]  Fax: [phone]       11/08/2009 MRN: [id number]  [facility] [address] [place], [place]  [address]  Dear Ms. Ludolph,  Welcome to the Gastroenterology Division at [facility].    You are scheduled to see Dr. Sheryn Bison  on December 07, 2009 at 9:30am on the 3rd floor at [facility], [address]  We ask that you try to arrive at our office 15 minutes prior to your appointment time to allow for check-in.  We would like you to complete the enclosed self-administered evaluation form prior to your visit and bring it with you on the day of your appointment.  We will review it with you.  Also, please bring a complete list of all your medications or, if you prefer, bring the medication bottles and we will list them.  Please bring your insurance card so that we may make a copy of it.  If your insurance requires a referral to see a specialist, please bring your referral form from your primary care physician.  Co-payments are due at the time of your visit and may be paid by cash, check or credit card.     Your office visit will consist of a consult with your physician (includes a physical exam), any laboratory testing he/she may order, scheduling of any necessary diagnostic testing (e.g. x-ray, ultrasound, CT-scan), and scheduling of a procedure (e.g. Endoscopy, Colonoscopy) if required.  Please allow enough time on your schedule to allow for any/all of these possibilities.    If you cannot keep your appointment, please call [phone] to cancel or reschedule prior to your appointment date.  This allows [place] the opportunity to schedule an appointment for another patient in need of care.  If you do not cancel or reschedule by 5 p.m. the business day prior to your appointment date, you will be charged a $50.00 late cancellation/no-show fee.    Thank you for  choosing  Gastroenterology for your medical needs.  We appreciate the opportunity to care for you.  Please visit [place] at our website  to learn more about our practice.                     Sincerely,                                                             The Gastroenterology Division

## 2010-08-07 NOTE — Progress Notes (Signed)
Summary: med refill  Phone Note Refill Request  on July 31, 2009 3:09 PM  Refills Requested: Medication #1:  ROBAXIN 500 MG TABS as needed   Dosage confirmed as above?Dosage Confirmed   Notes: [facility] [phone] Initial call taken by: Scharlene Gloss,  July 31, 2009 3:09 PM  Follow-up for Phone Call        rx faxed to pharmacy Follow-up by: Margaret Pyle, CMA,  August 01, 2009 7:57 AM    New/Updated Medications: ROBAXIN 500 MG TABS (METHOCARBAMOL) 1 by mouth two times a day as needed Prescriptions: ROBAXIN 500 MG TABS (METHOCARBAMOL) 1 by mouth two times a day as needed  #60 x 1   Entered and Authorized by:   Corwin Levins MD   Signed by:   Corwin Levins MD on 07/31/2009   Method used:   Print then Give to Patient   RxID:   [id number]  done hardcopy to LIM side B - dahlia  Corwin Levins MD  July 31, 2009 5:33 PM

## 2010-08-07 NOTE — Progress Notes (Signed)
Summary: IFOB  Phone Note Outgoing Call Call back at [facility] Phone [phone]   Call placed by: Harlow Mares CMA Duncan Dull),  March 29, 2010 12:02 PM Call placed to: Patient Summary of Call: spoke to the patient she says she returned the container for her IFOB on 8/22. She states that she has never heard anything from our office about there not being enough stool in the container. The note from the lab states that Lupita Leash should have called the patient, but there is no documention that Lupita Leash ever called the patient. I spoke to [facility] in the lab and the lab has already billed the patient, but she will reverse the charges since the patient was not aware she should have done it again. I advised patient that she needs to do the test again and if she gets a bill she is to call me so we can credit her the cost.  Initial call taken by: Harlow Mares CMA Duncan Dull),  March 29, 2010 12:05 PM

## 2010-08-07 NOTE — Assessment & Plan Note (Signed)
Summary: #3 of 3 weekly B12/266.2/dfs  Nurse Visit   Allergies: 1)  ! Pcn 2)  * Oxycodone  Medication Administration  Injection # 1:    Medication: Vit B12 1000 mcg    Diagnosis: B12 DEFICIENCY (ICD-266.2)    Route: IM    Site: L deltoid    Exp Date: 12/07/2011    Lot #: [id number]    Mfr: [employer]    Comments: patient given sample of [place] she will try it and if she decides on the injections she we will call us back and we can send her the rx to give it to herself at home.    Patient tolerated injection without complications    Given by: Harlow Mares CMA Duncan Dull) (March 05, 2010 9:42 AM)

## 2010-08-07 NOTE — Progress Notes (Signed)
Summary: [employer] Disability Form   [employer] Disability Form for completion. Request forwarded to [employer]. Dena Chavis  July 10, 2009 12:14 PM

## 2010-08-07 NOTE — Assessment & Plan Note (Signed)
Summary: SWOLLEN FEET/#/CD   Vital Signs:  Patient profile:   65 year old female Height:      63 inches Weight:      120 pounds BMI:     21.33 O2 Sat:      98 % on Room air Temp:     97.7 degrees F oral Pulse rate:   75 / minute BP sitting:   122 / 70  (left arm) Cuff size:   regular  Vitals Entered ByZella Ball Ewing (July 13, 2009 9:08 AM)  O2 Flow:  Room air CC: left foot swollen/RE   Primary Care Provider:  Corwin Levins MD  CC:  left foot swollen/RE.  History of Present Illness: here with isolated left foot and ankle swelling with moderate discomfort;  Pt denies CP, sob, doe, wheezing, orthopnea, pnd, worsening LE edema, palps, dizziness or syncope  .Pt denies new neuro symptoms such as headache, facial or extremity weakness other than the paraplegia;  no change in diet;  lives alone, but here with son who helps; she has compression stocking but difficult to use, she tries to elevate the leg as much as possible at home but hard due to the paraplegia.  no recent change in meds or OTC use.  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) 3)  Atrial Fibrillation  (ICD-427.31) 4)  Coronary Artery Disease  (ICD-414.00) 5)  Hyperlipidemia  (ICD-272.4) 6)  Hypertension  (ICD-401.9) 7)  Myocardial Infarction, Acute  (ICD-410.90) 8)  Lung Nodule  (ICD-518.89) 9)  Diabetes Mellitus, Type I, Uncontrolled  (ICD-250.03) 10)  Back Pain  (ICD-724.5) 11)  Preventive Health Care  (ICD-V70.0) 12)  Goiter, Multinodular  (ICD-241.1) 13)  Anemia-nos  (ICD-285.9) 14)  Allergic Rhinitis  (ICD-477.9) 15)  Osteopenia  (ICD-733.90) 16)  Preventive Health Care  (ICD-V70.0) 17)  Vaginitis  (ICD-616.10) 18)  Ganglion Cyst, Wrist, Right  (ICD-727.41) 19)  Depression  (ICD-311) 20)  Anxiety  (ICD-300.00) 21)  Deep Venous Thrombophlebitis  (ICD-453.40) 22)  Paraplegia   (ICD-344.1)  Medications Prior to Update: 1)  Fexofenadine Hcl 180 Mg Tabs (Fexofenadine Hcl) .... Take 1 Tablet By Mouth Once A Day 2)  Meclizine Hcl 25 Mg Tabs (Meclizine Hcl) .... Take 1 Tablet By Mouth Four Times A Day 3)  Metformin Hcl 500 Mg Tabs (Metformin Hcl) .... 2 By Mouth Qam and 1 By Mouth At Bedtime 4)  Pravastatin Sodium 10 Mg Tabs (Pravastatin Sodium) .... Take 1 Tablet By Mouth Once A Day 5)  Nitroglycerin 0.4 Mg Subl (Nitroglycerin) .... One Tablet Under Tongue Every 5 Minutes As Needed For Chest Pain---May Repeat Times Three 6)  Folic Acid 1 Mg Tabs (Folic Acid) .Marland Kitchen.. 1 By Mouth Daily 7)  Novolog 100 Unit/ml Soln (Insulin Aspart) .... As Directed 8)  Iron 325 (65 Fe) Mg Tabs (Ferrous Sulfate) .Marland Kitchen.. 1 By Mouth Dialy 9)  Alprazolam 0.5 Mg Tabs (Alprazolam) .Marland Kitchen.. 1 By Mouth Q6 Hours 10)  Diazepam 5 Mg Tabs (Diazepam) .... As Needed 11)  Oxycodone Hcl 5 Mg Tabs (Oxycodone Hcl) .... As Needed 12)  Robaxin 500 Mg Tabs (Methocarbamol) .... As Needed 13)  Metoprolol Tartrate 25 Mg Tabs (Metoprolol Tartrate) .... Take One Tablet By Mouth Twice A Day 14)  Aspirin 81 Mg Tbec (Aspirin) .... Take One Tablet By Mouth Daily 15)  Novolin 70/30 70-30 % Susp (Insulin Isophane & Regular) .Marland KitchenMarland KitchenMarland Kitchen  Take As Directed  Current Medications (verified): 1)  Fexofenadine Hcl 180 Mg Tabs (Fexofenadine Hcl) .... Take 1 Tablet By Mouth Once A Day 2)  Meclizine Hcl 25 Mg Tabs (Meclizine Hcl) .... Take 1 Tablet By Mouth Four Times A Day 3)  Metformin Hcl 500 Mg Tabs (Metformin Hcl) .... 2 By Mouth Qam and 1 By Mouth At Bedtime 4)  Pravastatin Sodium 10 Mg Tabs (Pravastatin Sodium) .... Take 1 Tablet By Mouth Once A Day 5)  Nitroglycerin 0.4 Mg Subl (Nitroglycerin) .... One Tablet Under Tongue Every 5 Minutes As Needed For Chest Pain---May Repeat Times Three 6)  Folic Acid 1 Mg Tabs (Folic Acid) .Marland Kitchen.. 1 By Mouth Daily 7)  Novolog 100 Unit/ml Soln (Insulin Aspart) .... As Directed 8)  Iron 325 (65 Fe) Mg Tabs  (Ferrous Sulfate) .Marland Kitchen.. 1 By Mouth Dialy 9)  Alprazolam 0.5 Mg Tabs (Alprazolam) .Marland Kitchen.. 1 By Mouth Q6 Hours 10)  Diazepam 5 Mg Tabs (Diazepam) .... As Needed 11)  Oxycodone Hcl 5 Mg Tabs (Oxycodone Hcl) .Marland Kitchen.. 1 Po Q 6 Hrs As Needed 12)  Robaxin 500 Mg Tabs (Methocarbamol) .... As Needed 13)  Metoprolol Tartrate 25 Mg Tabs (Metoprolol Tartrate) .... Take One Tablet By Mouth Twice A Day 14)  Aspirin 81 Mg Tbec (Aspirin) .... Take One Tablet By Mouth Daily 15)  Novolin 70/30 70-30 % Susp (Insulin Isophane & Regular) .... Take As Directed 16)  Hydrochlorothiazide 12.5 Mg Caps (Hydrochlorothiazide) .Marland Kitchen.. 1p O Once Daily 17)  Baclofen 10 Mg Tabs (Baclofen) .Marland Kitchen.. 1 By Mouth Two Times A Day As Needed Spasm  Allergies (verified): 1)  ! Pcn  Past History:  Past Surgical History: Last updated: 05/25/2009 Rotator cuff repair-R Caesarean section s/p right wrist ganglion cyst s/p right retinal detachment 2005 CABG - 03/2009 Lamitectomy-2010  Social History: Last updated: 03/11/2009 Never Smoked Alcohol use-no Divorced - lives alone 2 children work - Social worker  Risk Factors: Smoking Status: never (11/04/2007)  Past Medical History: CORONARY ARTERY DISEASE (ICD-414.00)      a. s/p CABG x 2:  Lima-LAD, VG-RCA 03/30/2009 HYPERLIPIDEMIA (ICD-272.4) HYPERTENSION (ICD-401.9) MYOCARDIAL INFARCTION, ACUTE (ICD-410.90) LUNG NODULE (ICD-518.89) DIABETES MELLITUS, TYPE I, UNCONTROLLED (ICD-250.03) BACK PAIN (ICD-724.5)      a. 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 PREVENTIVE HEALTH CARE (ICD-V70.0) GOITER, MULTINODULAR (ICD-241.1) ANEMIA-NOS (ICD-285.9) ALLERGIC RHINITIS (ICD-477.9) OSTEOPENIA (ICD-733.90) PREVENTIVE HEALTH CARE (ICD-V70.0) VAGINITIS (ICD-616.10) GANGLION CYST, WRIST, RIGHT (ICD-727.41) DEPRESSION (ICD-311) ANXIETY (ICD-300.00) DEEP VENOUS THROMBOPHLEBITIS (ICD-453.40) PARAPLEGIA (ICD-344.1)  Review of Systems       all otherwise negative  per pt -   Physical Exam  General:  alert and underweight appearing.   Head:  normocephalic and atraumatic.   Eyes:  vision grossly intact, pupils equal, and pupils round.   Ears:  R ear normal and L ear normal.   Nose:  no external deformity and no nasal discharge.   Mouth:  no gingival abnormalities and pharynx pink and moist.   Neck:  supple and no masses.   Lungs:  normal respiratory effort and normal breath sounds.   Heart:  normal rate and regular rhythm.   Abdomen:  soft and non-tender.   Msk:  no joint tenderness and no joint swelling.  including the left MTP's and ankle Extremities:  left pedal edema 2-3+ only with some mild at the ankle level, slight erythema and minor tender, no ulcerations or red streaks   Impression & Recommendations:  Problem # 1:  PERIPHERAL EDEMA (ICD-782.3)  Her updated medication list for this problem includes:    Hydrochlorothiazide 12.5 Mg Caps (Hydrochlorothiazide) .Marland Kitchen... 1p o once daily most likely due to venous edema - for low dose hctz to help, low salt diet, stockings and elevation as able  Problem # 2:  HYPERTENSION (ICD-401.9)  Her updated medication list for this problem includes:    Metoprolol Tartrate 25 Mg Tabs (Metoprolol tartrate) .Marland Kitchen... Take one tablet by mouth twice a day    Hydrochlorothiazide 12.5 Mg Caps (Hydrochlorothiazide) .Marland Kitchen... 1p o once daily  BP today: 122/70 Prior BP: 140/70 (05/25/2009)  Labs Reviewed: K+: 4.0 (05/25/2009) Creat: : 1.0 (05/25/2009)   Chol: 134 (09/09/2008)   HDL: 60.2 (09/09/2008)   LDL: 62 (09/09/2008)   TG: 59 (09/09/2008) stable overall by hx and exam, ok to continue meds/tx as is   Problem # 3:  DIABETES MELLITUS, TYPE I, UNCONTROLLED (ICD-250.03)  Her updated medication list for this problem includes:    Metformin Hcl 500 Mg Tabs (Metformin hcl) .Marland Kitchen... 2 by mouth qam and 1 by mouth at bedtime    Novolog 100 Unit/ml Soln (Insulin aspart) .Marland Kitchen... As directed    Aspirin 81 Mg Tbec (Aspirin) .Marland Kitchen...  Take one tablet by mouth daily    Novolin 70/30 70-30 % Susp (Insulin isophane & regular) .Marland Kitchen... Take as directed  Labs Reviewed: Creat: 1.0 (05/25/2009)    Reviewed HgBA1c results: 10.5 (09/09/2008)  12.7 (03/10/2008) has had chronic poor control, denies polys;  to cont current meds, check next visit  Complete Medication List: 1)  Fexofenadine Hcl 180 Mg Tabs (Fexofenadine hcl) .... Take 1 tablet by mouth once a day 2)  Meclizine Hcl 25 Mg Tabs (Meclizine hcl) .... Take 1 tablet by mouth four times a day 3)  Metformin Hcl 500 Mg Tabs (Metformin hcl) .... 2 by mouth qam and 1 by mouth at bedtime 4)  Pravastatin Sodium 10 Mg Tabs (Pravastatin sodium) .... Take 1 tablet by mouth once a day 5)  Nitroglycerin 0.4 Mg Subl (Nitroglycerin) .... One tablet under tongue every 5 minutes as needed for chest pain---may repeat times three 6)  Folic Acid 1 Mg Tabs (Folic acid) .Marland Kitchen.. 1 by mouth daily 7)  Novolog 100 Unit/ml Soln (Insulin aspart) .... As directed 8)  Iron 325 (65 Fe) Mg Tabs (Ferrous sulfate) .Marland Kitchen.. 1 by mouth dialy 9)  Alprazolam 0.5 Mg Tabs (Alprazolam) .Marland Kitchen.. 1 by mouth q6 hours 10)  Diazepam 5 Mg Tabs (Diazepam) .... As needed 11)  Oxycodone Hcl 5 Mg Tabs (Oxycodone hcl) .Marland Kitchen.. 1 po q 6 hrs as needed 12)  Robaxin 500 Mg Tabs (Methocarbamol) .... As needed 13)  Metoprolol Tartrate 25 Mg Tabs (Metoprolol tartrate) .... Take one tablet by mouth twice a day 14)  Aspirin 81 Mg Tbec (Aspirin) .... Take one tablet by mouth daily 15)  Novolin 70/30 70-30 % Susp (Insulin isophane & regular) .... Take as directed 16)  Hydrochlorothiazide 12.5 Mg Caps (Hydrochlorothiazide) .Marland Kitchen.. 1p o once daily 17)  Baclofen 10 Mg Tabs (Baclofen) .Marland Kitchen.. 1 by mouth two times a day as needed spasm  Patient Instructions: 1)  take the fluid pill one per day 2)  try to wear the stocking as you can 3)  try to keep the leg elevated as you can 4)  try to avoid salt in your diet 5)  Continue all previous medications as before  this visit  6)  Please schedule a follow-up appointment in 1 month with CPX labs and: 7)  HbgA1C prior to  visit, ICD-9: 250.02 8)  Urine Microalbumin prior to visit, ICD-9: 9)  b12/folate 285.9 10)  iron/tibc  285.9 Prescriptions: OXYCODONE HCL 5 MG TABS (OXYCODONE HCL) 1 po q 6 hrs as needed  #100 x 0   Entered and Authorized by:   Corwin Levins MD   Signed by:   Corwin Levins MD on 07/13/2009   Method used:   Print then Give to Patient   RxID:   [id number] FEXOFENADINE HCL 180 MG TABS (FEXOFENADINE HCL) Take 1 tablet by mouth once a day  #90 x 3   Entered and Authorized by:   Corwin Levins MD   Signed by:   Corwin Levins MD on 07/13/2009   Method used:   Electronically to        [facility]  [address] [phone]* (retail)       [address]       [place], [place]  [id number]       Ph: [phone] or [phone]       Fax: [phone]   RxID:   [id number] BACLOFEN 10 MG TABS (BACLOFEN) 1 by mouth two times a day as needed spasm  #60 x 5   Entered and Authorized by:   Corwin Levins MD   Signed by:   Corwin Levins MD on 07/13/2009   Method used:   Electronically to        [facility]  [address] [phone]* (retail)       [address]       [place], [place]  [id number]       Ph: [phone] or [phone]       Fax: [phone]   RxID:   [id number] HYDROCHLOROTHIAZIDE 12.5 MG CAPS (HYDROCHLOROTHIAZIDE) 1p o once daily  #90 x 3   Entered and Authorized by:   Corwin Levins MD   Signed by:   Corwin Levins MD on 07/13/2009   Method used:   [facility]ronically to        [facility]  [address] [phone]* (retail)       [address]       [place], [place]  [id number]       Ph: [phone] or [phone]       Fax: [phone]   RxID:   [id number]

## 2010-08-07 NOTE — Progress Notes (Signed)
  Walk in Patient Form Recieved "Pt left FMLA papers forwarded to [employer] for processing. Veronica Copeland  July 31, 2009 8:43 AM

## 2010-08-07 NOTE — Assessment & Plan Note (Signed)
Summary: enemia--ch.   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 She is on aspirin but apparently is not on Coumadin or Plavix. She has severe coronary artery disease, hypertension, hyperlipidemia, diabetes, osteoporosis, and chronic anxiety and depression. She is referred to GI because of a mild anemia of unexplained etiology. She has never had endoscopy or colonoscopy. She denies any GI complaints whatsoever. She is on daily aspirin and iron therapy.  Review her chart shows that she is on daily omeprazole for reasons which are unclear. She has had a lateral onset diabetes for many years and is insulin-dependent. A satiety with her diabetes diseases or visual loss, peripheral neuropathy, and peripheral vascular disease. Family is negative for any known gastrointestinal problems.  She is very thin his head chronic anorexia but denies dysphagia, history of hepatitis or pancreatitis. She walks with a walker and is slightly ambulatory, but has very limited exercise capacity. She denies current use of alcohol or cigarettes. She lives by herself apparently.   GI Review of Systems    Reports belching, loss of appetite, nausea, vomiting, and  weight loss.      Denies abdominal pain, acid reflux, bloating, chest pain, dysphagia with liquids, dysphagia with solids, heartburn, vomiting blood, and  weight gain.      Reports constipation.     Denies anal fissure, black tarry stools, change in bowel habit, diarrhea, diverticulosis, fecal incontinence, heme positive stool, hemorrhoids,  irritable bowel syndrome, jaundice, light color stool, liver problems, rectal bleeding, and  rectal pain. Preventive Screening-Counseling & Management      Drug Use:  no.      Current Medications (verified): 1)  Fexofenadine Hcl 180 Mg Tabs (Fexofenadine Hcl) .... Take 1 Tablet By Mouth Once A Day 2)  Metformin Hcl 500 Mg Tabs (Metformin Hcl) .... 2 By Mouth Two Times A Day 3)  Pravastatin Sodium 10 Mg Tabs (Pravastatin Sodium) .... Take 1 Tablet By Mouth Once A Day 4)  Nitroglycerin 0.4 Mg Subl (Nitroglycerin) .... One Tablet Under Tongue Every 5 Minutes As Needed For Chest Pain---May Repeat Times Three 5)  Folic Acid 1 Mg Tabs (Folic Acid) .Marland Kitchen.. 1 By Mouth Daily 6)  Novolog 100 Unit/ml Soln (Insulin Aspart) .... As Directed 7)  Iron 325 (65 Fe) Mg Tabs (Ferrous Sulfate) .Marland Kitchen.. 1 By Mouth Dialy 8)  Aspirin 81 Mg Tbec (Aspirin) .... Take One Tablet By Mouth Daily 9)  Citalopram Hydrobromide 10 Mg Tabs (Citalopram Hydrobromide) .Marland Kitchen.. 1 By Mouth Once Daily 10)  Flexeril 5 Mg Tabs (Cyclobenzaprine Hcl) .Marland Kitchen.. 1po Three Times A Day As Needed 11)  Tramadol Hcl 50 Mg Tabs (Tramadol Hcl) .... As Needed 12)  Omeprazole 20 Mg Cpdr (Omeprazole) .Marland Kitchen.. 1po Once Daily  Allergies (verified): 1)  ! Pcn 2)  * Oxycodone  Past History:  Past medical, surgical, family and social histories (including risk factors) reviewed for relevance to current acute and chronic problems.  Past Medical History: Reviewed history from 08/10/2009 and no changes required. CORONARY ARTERY DISEASE (ICD-414.00)      a. s/p CABG x 2:  Lima-LAD, VG-RCA 03/30/2009 HYPERLIPIDEMIA (ICD-272.4) HYPERTENSION (ICD-401.9) MYOCARDIAL INFARCTION, ACUTE (  ICD-410.90) LUNG NODULE (ICD-518.89) DIABETES MELLITUS, TYPE I, UNCONTROLLED (ICD-250.03) BACK PAIN (ICD-724.5)      a. 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 PREVENTIVE HEALTH CARE (ICD-V70.0) GOITER, MULTINODULAR (ICD-241.1) ANEMIA-NOS (ICD-285.9) ALLERGIC  RHINITIS (ICD-477.9) OSTEOPENIA (ICD-733.90) PREVENTIVE HEALTH CARE (ICD-V70.0) VAGINITIS (ICD-616.10) GANGLION CYST, WRIST, RIGHT (ICD-727.41) DEPRESSION (ICD-311) ANXIETY (ICD-300.00) DEEP VENOUS THROMBOPHLEBITIS (ICD-453.40) PARAPLEGIA (ICD-344.1) Anemia-NOS  Past Surgical History: Reviewed history from 05/25/2009 and no changes required. Rotator cuff repair-R Caesarean section s/p right wrist ganglion cyst s/p right retinal detachment 2005 CABG - 03/2009 Lamitectomy-2010  Family History: Reviewed history from 03/11/2009 and no changes required. DM--2 sibs HTN heart disease grandmother with stroke a sister has a large goiter Positive for CAD and DM  Social History: Reviewed history from 03/11/2009 and no changes required. Never Smoked Alcohol use-no Divorced - lives alone 2 children work - Medical laboratory scientific officer semi retired Illicit Drug Use - no Drug Use:  no  Review of Systems       The patient complains of allergy/sinus, anemia, fatigue, headaches-new, and swelling of feet/legs.  The patient denies anxiety-new, arthritis/joint pain, back pain, blood in urine, breast changes/lumps, change in vision, confusion, cough, coughing up blood, depression-new, fainting, fever, hearing problems, heart murmur, heart rhythm changes, itching, menstrual pain, muscle pains/cramps, night sweats, nosebleeds, pregnancy symptoms, shortness of breath, skin rash, sleeping problems, sore throat, swollen lymph glands, thirst - excessive , urination - excessive , urination changes/pain, urine leakage, vision changes, and voice change.   General:  Complains of fatigue and weakness; denies fever, chills, sweats, anorexia, malaise, weight loss, and sleep disorder. Eyes:  Complains of diplopia and vision loss; denies blurring, irritation, discharge, scotoma, eye pain, and photophobia. ENT:  Denies earache, ear discharge, tinnitus, decreased hearing, nasal congestion, loss of smell, nosebleeds, sore  throat, hoarseness, and difficulty swallowing. CV:  Complains of chest pains and dyspnea on exertion; denies angina, palpitations, syncope, orthopnea, PND, peripheral edema, and claudication. Resp:  Complains of dyspnea with exercise and cough; denies dyspnea at rest, sputum, wheezing, coughing up blood, and pleurisy. GI:  Denies difficulty swallowing, pain on swallowing, nausea, indigestion/heartburn, vomiting, vomiting blood, abdominal pain, jaundice, gas/bloating, diarrhea, constipation, change in bowel habits, bloody BM's, black BMs, and fecal incontinence. GU:  Denies urinary burning, blood in urine, nocturnal urination, urinary frequency, urinary incontinence, abnormal vaginal bleeding, amenorrhea, menorrhagia, vaginal discharge, pelvic pain, genital sores, painful intercourse, and decreased libido. MS:  Complains of joint pain / LOM, joint swelling, joint deformity, and muscle weakness; denies joint stiffness, low back pain, muscle cramps, muscle atrophy, leg pain at night, leg pain with exertion, and shoulder pain / LOM hand / wrist pain (CTS). Derm:  Denies rash, itching, dry skin, hives, moles, warts, and unhealing ulcers. Neuro:  Complains of abnormal sensation, difficulty walking, and radiculopathy other:; denies weakness, paralysis, seizures, syncope, tremors, vertigo, transient blindness, frequent falls, frequent headaches, headache, sciatica, restless legs, memory loss, and confusion. Psych:  Complains of depression and anxiety; denies memory loss, suicidal ideation, hallucinations, paranoia, phobia, and confusion. Endo:  Complains of cold intolerance; denies heat intolerance, polydipsia, polyphagia, polyuria, unusual weight change, and hirsutism. Heme:  Complains of bruising; denies bleeding, enlarged lymph nodes, and pagophagia. Allergy:  Denies hives, rash, sneezing, hay fever, and recurrent infections.  Vital Signs:  Patient profile:   65 year old female Height:      63  inches Weight:      102 pounds BMI:     18.13 Pulse rate:   92 /  minute Pulse rhythm:   regular BP sitting:   166 / 84  (left arm) Cuff size:   regular  Vitals Entered By: June McMurray CMA Duncan Dull) (February 01, 2010 9:17 AM)  Physical Exam  General:  Chronically ill-appearing black female appearing alert than her stated age in no acute distress Head:  Normocephalic and atraumatic. Eyes:  PERRLA, no icterus.exam deferred to patient's ophthalmologist.   Lungs:  Clear throughout to auscultation. Heart:  Regular rate and rhythm; no murmurs, rubs,  or bruits. Abdomen:  Soft, nontender and nondistended. No masses, hepatosplenomegaly or hernias noted. Normal bowel sounds. Msk:  Braces on lower extremities with ability for limited examination. I cannot appreciate any definite edema, swollen joints, or phlebitis. Neurologic:  Alert and  oriented x4;  grossly normal neurologically.weakness noted.   Psych:  Alert and cooperative. Normal mood and affect.depressed affect.     Impression & Recommendations:  Problem # 1:  ANEMIA, IRON DEFICIENCY (ICD-280.9) Assessment Improved She apparently has been on iron therapy for one month. I cannot find any record of stool Hemoccult cards ever been performed. We will do heme-select stool cards for human hemoglobin, and repeat her labs. She has no GI symptomatology whatsoever. She is a very Poor, Poor candidate for any outpatient colonoscopy or endoscopy procedure. I cannot see performing these procedures without further evidence of increased benefit per her multiple comorbid medical problems. We will try to schedule CT colonoscopy for screening. If she had to have endoscopic procedures, she would probably need hospitalization. Her friend with her today related that the patient has intermittent nausea and vomiting, but the patient denied these symptoms. Apparently she does have mild GERD for which he takes daily omeprazole. Orders: TLB-Hepatic/Liver Function Pnl  (80076-HEPATIC) TLB-TSH (Thyroid Stimulating Hormone) (84443-TSH) TLB-B12, Serum-Total ONLY (30865-H84) TLB-Ferritin (82728-FER) TLB-IBC Pnl (Iron/FE;Transferrin) (83550-IBC) TLB-CBC Platelet - w/Differential (85025-CBCD)  Problem # 2:  CAD, ARTERY BYPASS GRAFT (ICD-414.04) Assessment: Improved continue multiple medications per Dr. Oliver Barre and Dr. Shawnie Pons.  Problem # 3:  ATRIAL FIBRILLATION (ICD-427.31) Assessment: Improved She apparently is not on Coumadin or Plavix at this time although her records are incomplete.  Problem # 4:  HYPERTENSION (ICD-401.9) Assessment: Improved blood pressure today 166/84.  Patient Instructions: 1)  Please go to  the basement for lab work. 2)  We will call you regarding an appt for a virtual colonoscopy. 3)  Please continue current medications.  4)  The medication list was reviewed and reconciled.  All changed / newly prescribed medications were explained.  A complete medication list was provided to the patient / caregiver. 5)  Copy sent to : Dr. Oliver Barre and Dr. Eustaquio Maize in cardiology  Appended Document: enemia--ch.    Clinical Lists Changes  Orders: Added new Test order of Virtual Colon ([facility]) - Signed

## 2010-08-07 NOTE — Letter (Signed)
Summary: [name]   Imported By: Marylou Mccoy 01/17/2010 12:01:12  _____________________________________________________________________  External Attachment:    Type:   Image     Comment:   External Document

## 2010-08-07 NOTE — Progress Notes (Signed)
Summary: Virtual Colon  Phone Note From Other Clinic   Summary of Call: Arena from [facility] imaging calling:  Pt is afraid to take the prep for the virtual colon.  She is in a wheel chair and does not think she can do the prep.  Proc has not been approved by medicare.  Pt would have to have the proc and then it would be billed.  Payment is not guaranted.  Racheal Patches will fax as copy of the prep for the virtual colon so we can see what is involved.   Initial call taken by: Ashok Cordia RN,  February 13, 2010 4:50 PM  Follow-up for Phone Call        Talked with pt;  She does not think she can do the prep that is needed for the virtual colonosopy.  Prep takes 2 days.  Pt would have to be on clear liquids and she is having problems with her blood sugars.  Pt would like to wait a month or so and see if she gains any of her strength back.  She is currently taking physical tharapy.  She will be doing stool check for blood next week.  Pt feels she has too many problems right now to try to have this done. Follow-up by: Ashok Cordia RN,  February 14, 2010 10:23 AM  Additional Follow-up for Phone Call Additional follow up Details #1::        ok... Additional Follow-up by: Mardella Layman MD [facility],  February 14, 2010 1:45 PM

## 2010-08-07 NOTE — Progress Notes (Signed)
Summary: Rx refill req  Phone Note Call from Patient Call back at Home Phone [phone]   Caller: Patient Summary of Call: Pt called requesting Rx for Dexilant 60mg  1 by mouth once daily. Pt says she was given samples and that it helped with GERD sxs better than Omeprazole Initial call taken by: Margaret Pyle, CMA,  April 30, 2010 4:33 PM    New/Updated Medications: DEXILANT 60 MG CPDR (DEXLANSOPRAZOLE) 1 by mouth once daily Prescriptions: DEXILANT 60 MG CPDR (DEXLANSOPRAZOLE) 1 by mouth once daily  #30 x 3   Entered by:   Margaret Pyle, CMA   Authorized by:   Corwin Levins MD   Signed by:   Margaret Pyle, CMA on 04/30/2010   Method used:   [facility]        [facility] [phone][facility] (retail)       [address]       [place], [place]  [address]       Ph: [phone] or [phone]       Fax: [phone]   RxID:   [id number]

## 2010-08-07 NOTE — Letter (Signed)
Summary: [facility]: Immunoassay Fecal Occult Blood (iFOB) Order [facility]  [address] [place], [place] [address]   Phone: [phone]  Fax: [phone]      [name]: Immunoassay Fecal Occult Blood (iFOB) Order Form   February 14, 2010 MRN: [id number]   Veronica Copeland 1945-12-14   Physicican Name: Jarold Motto  Diagnosis Code: 280.9     Ashok Cordia RN

## 2010-08-07 NOTE — Miscellaneous (Signed)
Summary: Appointment Canceled  Appointment status changed to canceled by [employer] on 05/22/2010 12:06 PM.  Cancellation Comments --------------------- my  Appointment Information ----------------------- Appt Type:  CARDIOLOGY NUCLEAR TESTING      Date:  Monday, June 04, 2010      Time:  9:00 AM for 15 min   Urgency:  Routine   Made By:  Pearson Grippe  To Visit:  [id number]    Reason:  wt:111/dobutomine rest stress/dx:per check out on 11/14/lg  Appt Comments ------------- -- 05/22/10 12:06: (CEMR) CANCELED -- my -- 05/22/10 12:06: (CEMR) CANCELED -- my -- 05/21/10 15:39: (CEMR) BOOKED -- Routine CARDIOLOGY NUCLEAR TESTING at 06/04/2010 9:00 AM for 15 min wt:111/dobutomine rest stress/dx:per check out on 11/14/lg

## 2010-08-08 ENCOUNTER — Telehealth: Payer: Self-pay | Admitting: Internal Medicine

## 2010-08-09 NOTE — Progress Notes (Signed)
  Phone Note Refill Request Message from:  Fax from Pharmacy on July 16, 2010 3:27 PM  Refills Requested: Medication #1:  NOVOLOG 100 UNIT/ML SOLN as directed   Dosage confirmed as above?Dosage Confirmed   [facility] Initial call taken by: Zella Ball Ewing CMA Duncan Dull),  July 16, 2010 3:28 PM    Prescriptions: NOVOLOG 100 UNIT/ML SOLN (INSULIN ASPART) as directed  #1 month x 7   Entered by:   Zella Ball Ewing CMA (AAMA)   Authorized by:   Corwin Levins MD   Signed by:   Scharlene Gloss CMA (AAMA) on 07/16/2010   Method used:   F[facility]ed to ...       [facility] [phone]* (retail)       [address]       [place], [place]  [address]       Ph: [phone] or [phone]       Fax: [phone]   RxID:   [id number]

## 2010-08-15 ENCOUNTER — Encounter: Payer: Self-pay | Admitting: Internal Medicine

## 2010-08-15 ENCOUNTER — Ambulatory Visit (INDEPENDENT_AMBULATORY_CARE_PROVIDER_SITE_OTHER): Payer: Medicare Other | Admitting: Internal Medicine

## 2010-08-15 DIAGNOSIS — K219 Gastro-esophageal reflux disease without esophagitis: Secondary | ICD-10-CM | POA: Insufficient documentation

## 2010-08-15 DIAGNOSIS — I10 Essential (primary) hypertension: Secondary | ICD-10-CM

## 2010-08-15 DIAGNOSIS — M79609 Pain in unspecified limb: Secondary | ICD-10-CM

## 2010-08-15 DIAGNOSIS — E1065 Type 1 diabetes mellitus with hyperglycemia: Secondary | ICD-10-CM

## 2010-08-15 LAB — CONVERTED CEMR LAB: LDL Goal: 70 mg/dL

## 2010-08-15 NOTE — Progress Notes (Signed)
  Phone Note Refill Request Message from:  Fax from Pharmacy on August 08, 2010 8:20 AM  Refills Requested: Medication #1:  DEXILANT 60 MG CPDR 1 by mouth once daily   Dosage confirmed as above?Dosage Confirmed   Last Refilled: 04/30/2010 Initial call taken by: Robin Ewing CMA (AAMA),  August 08, 2010 8:20 AM    Prescriptions: DEXILANT 60 MG CPDR (DEXLANSOPRAZOLE) 1 by mouth once daily  #30 x 3   Entered by:   Zella Ball Ewing CMA (AAMA)   Authorized by:   Corwin Levins MD   Signed by:   Scharlene Gloss CMA (AAMA) on 08/08/2010   Method used:   Faxed to ...       [facility]  [address] [phone]* (retail)       [address]       [place], [place]  [address]       Ph: [phone] or [phone]       Fax: [phone]   RxID:   [id number]

## 2010-08-18 IMAGING — CR DG CHEST 1V PORT
1 series · 1 of 1 positions shown · non-contrast
Comparison: 03/27/2009 study

CLINICAL DATA: History of coronary artery disease and coronary
artery bypass grafting.

PORTABLE CHEST - 1 VIEW

[AP]
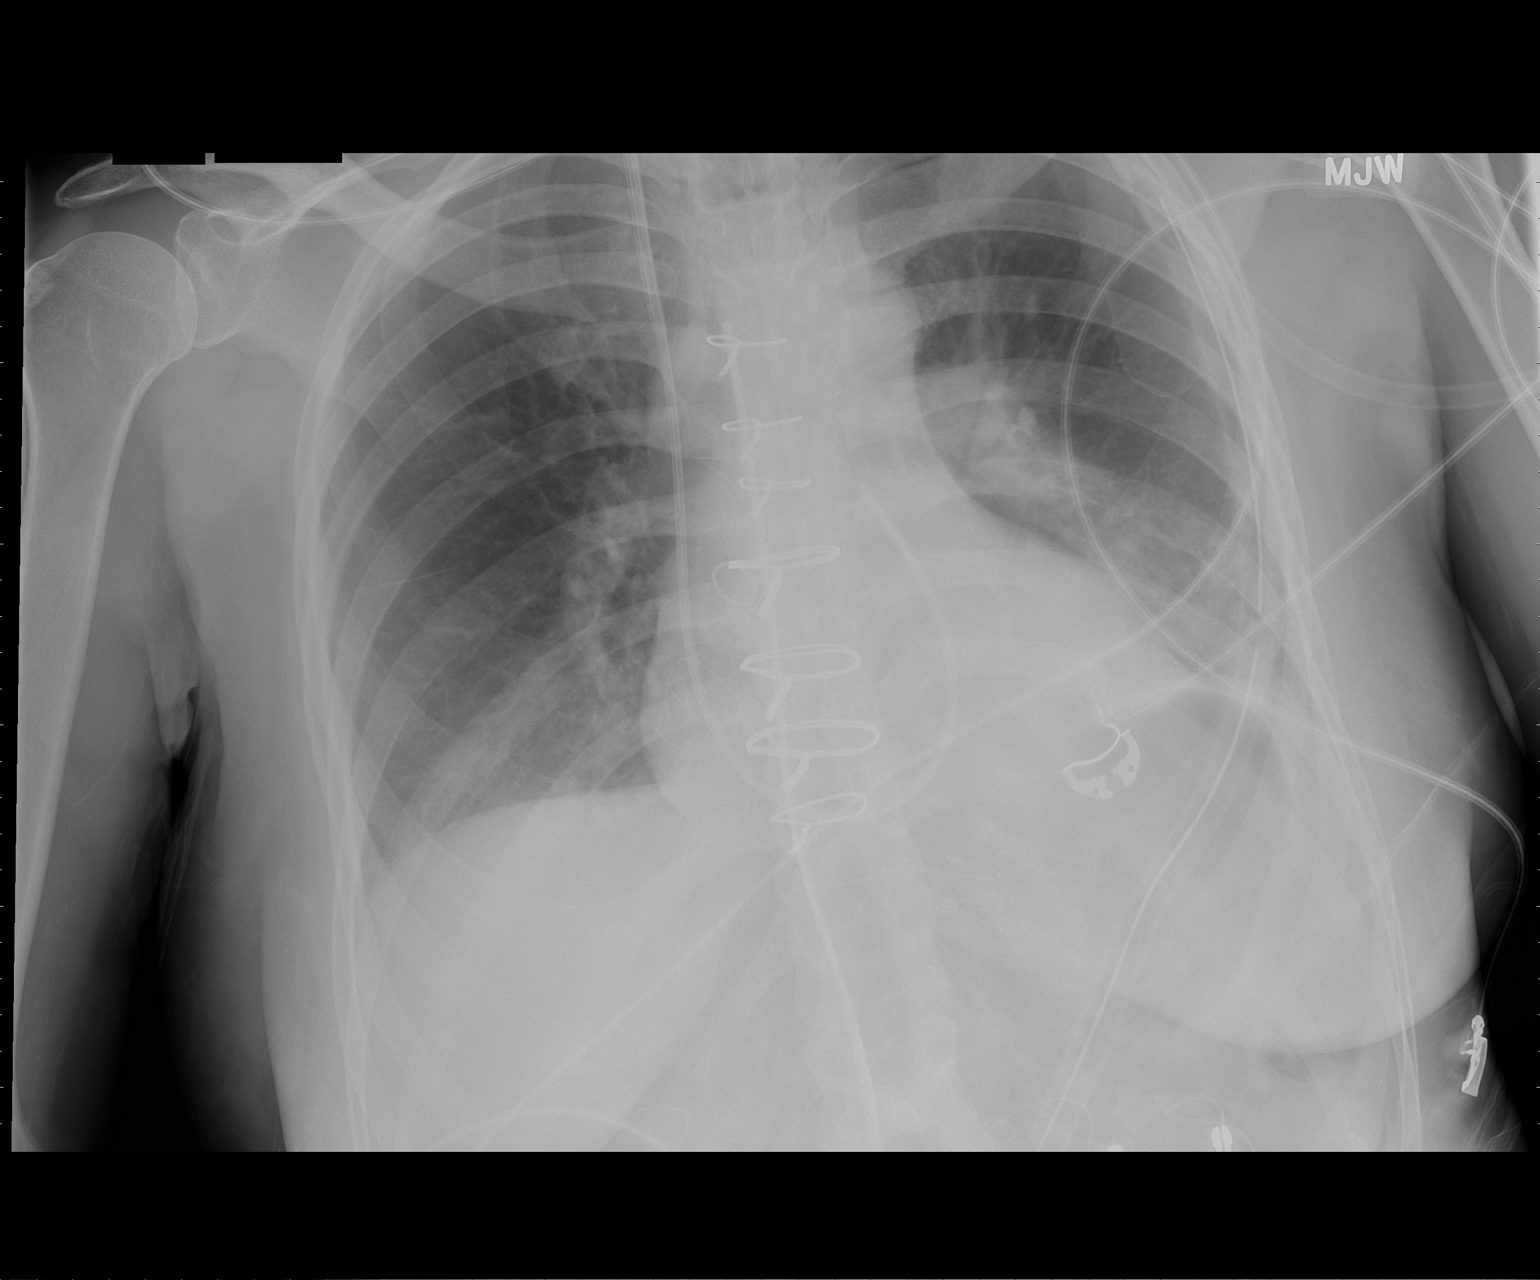

[1 of 1 positions shown; findings below may reference images not displayed]

FINDINGS: The endotracheal tube and the enteric tube have been
removed.  Right internal jugular Swan-Ganz catheter is in place
with tip in main pulmonary artery.  No pneumothorax is evident.
Mediastinal drain is in place.  Median sternotomy has been
performed.  Cardiac silhouette is upper range normal in size.
There is minimal atelectasis and haziness in the right base.  There
is slight increase in the atelectasis and haziness in the left
base.  There is decrease in vascular congestion.
IMPRESSION: The endotracheal tube and the enteric tube have been removed.
Right internal jugular Swan-Ganz catheter is in place with tip in
main pulmonary artery. There is minimal atelectasis and haziness in
the right base.  There is slight increase in the atelectasis and
haziness in the left base.  There is decrease in vascular
congestion.

## 2010-08-20 DIAGNOSIS — Z0279 Encounter for issue of other medical certificate: Secondary | ICD-10-CM

## 2010-08-20 IMAGING — CR DG CHEST 1V PORT
1 series · 1 of 1 positions shown · non-contrast
Comparison: the previous day's study

CLINICAL DATA: Coronary artery disease

PORTABLE CHEST - 1 VIEW

[view not recorded]
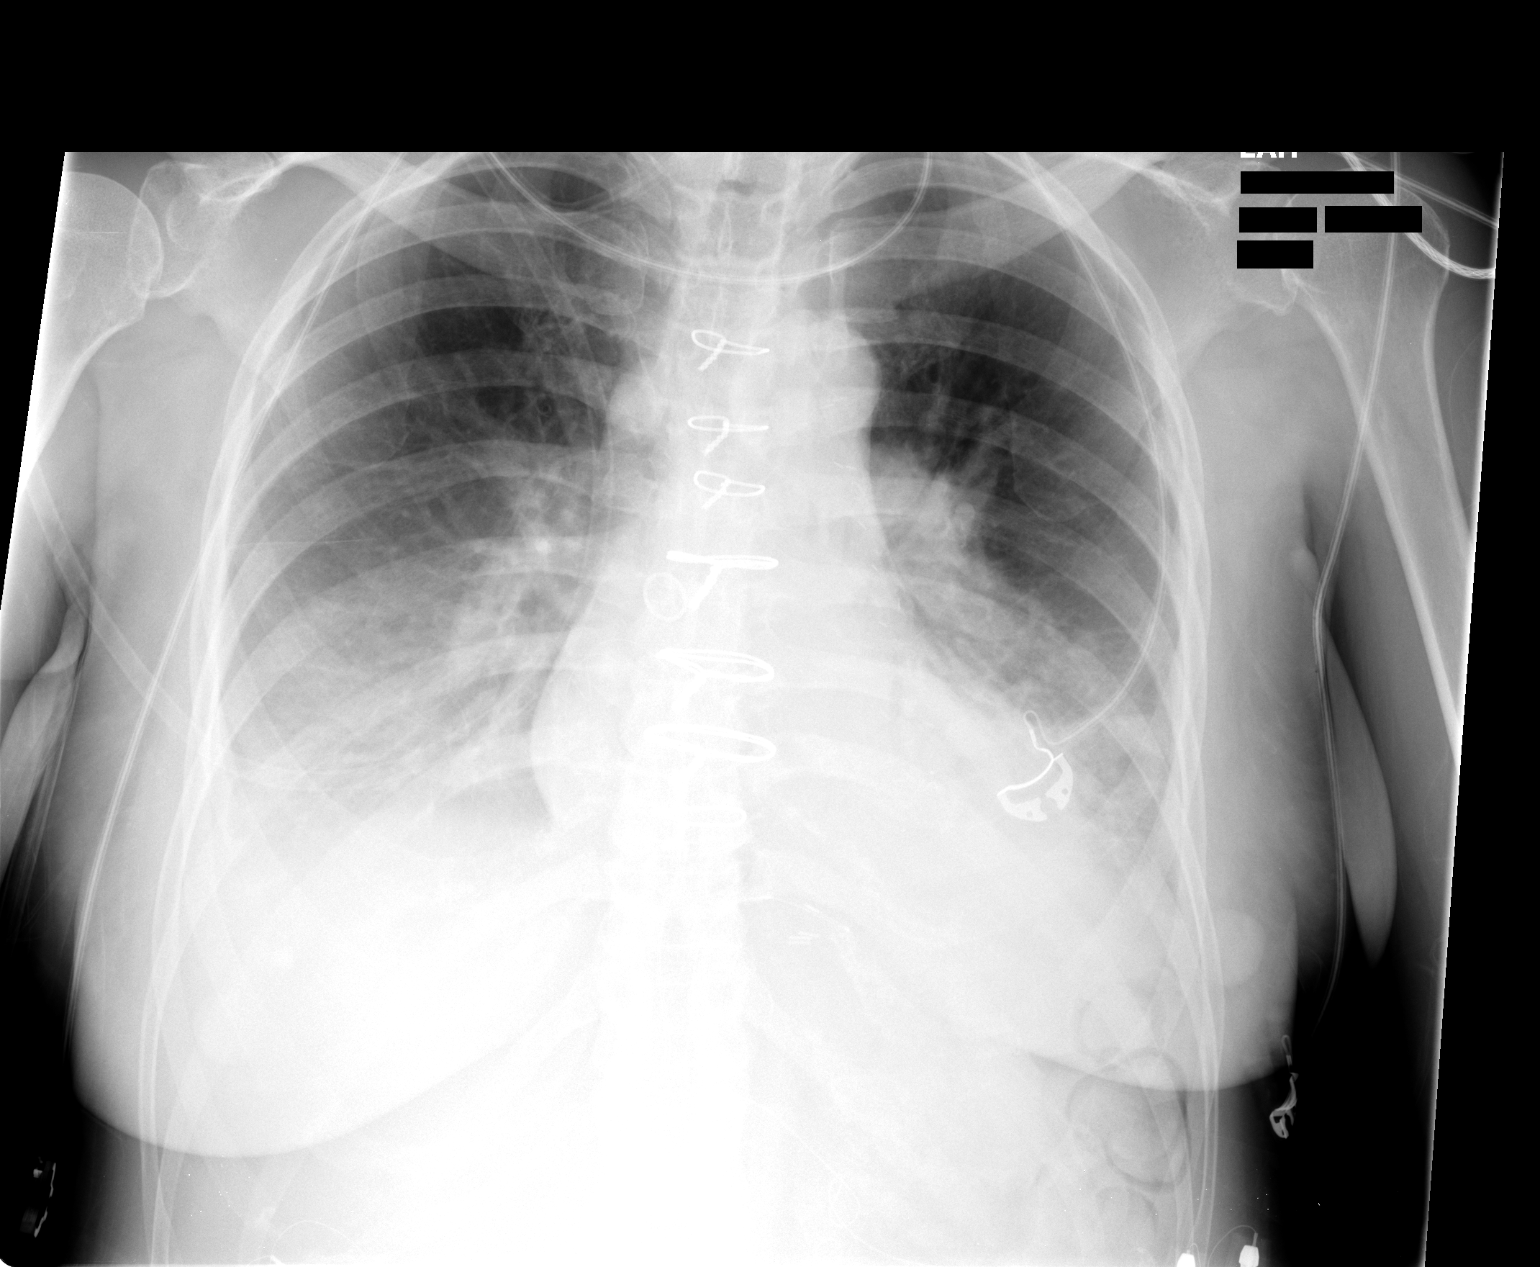

[1 of 1 positions shown; findings below may reference images not displayed]

FINDINGS: The right IJ venous sheath has been retracted.  The left
chest tube has been removed with no pneumothorax evident.  Small
bilateral pleural effusions with some interval increase in
bibasilar infiltrates, edema, or atelectasis.  Heart size stable.
Previous CABG.
IMPRESSION: 1.  Left chest tube removal without pneumothorax.
2.  Worsening bibasilar infiltrates, edema, or atelectasis with
probable effusions.

## 2010-08-23 NOTE — Assessment & Plan Note (Signed)
Summary: SWELLING IN FOOT /NWS #   Vital Signs:  Patient profile:   65 year old female Height:      63.5 inches Weight:      117 pounds BMI:     20.47 O2 Sat:      97 % on Room air Temp:     98.7 degrees F oral Pulse rate:   94 / minute BP sitting:   112 / 70  (left arm) Cuff size:   regular  Vitals Entered By: Zella Ball Ewing CMA (AAMA) (August 15, 2010 1:38 PM)  O2 Flow:  Room air CC: Left ankle swollen and painful/RE   Primary Care Provider:  Corwin Levins MD  CC:  Left ankle swollen and painful/RE.  History of Present Illness: here to f/u;  had recent dental infection with 10 days amoxil approx 3 wks ago,  dexliant too expensive and still wtih ongoing reflux wtihout dysphagia, n/v, abd pain, diarrhea/constipatoin or blood;   also has left foot and ankle swelling new for 3 days, with pain seeming to start at the left first MTP area and swelling after that;  seemed to wake up with it, gradually worse but overall mild to mod only;  No fever, wt loss, night sweats, loss of appetite or other constitutional symptoms , and no recent trauma, or hx of prior gout.  Wears a leg brace  due to LLE weakness post CABG due to cord compression.    No recent DVT and Pt denies CP, worsening sob, doe, wheezing, orthopnea, pnd, worsening LE edema, palps, dizziness or syncope  Pt denies new neuro symptoms such as headache, facial or extremity weakness  Pt denies polydipsia, polyuria, or low sugar symptoms such as shakiness improved with eating.  Overall good compliance with meds, trying to follow low chol, DM diet, wt stable, little excercise however Denies worsening depressive symptoms, suicidal ideation, or panic, though has some ongoing anxiety.   no recent diuretic use, ETOH use or dehdyration.    Lipid Management History:      Positive NCEP/ATP III risk factors include female age 11 years old or older, diabetes, hypertension, and ASHD (either angina/prior MI/prior CABG).  Negative NCEP/ATP III risk  factors include HDL cholesterol greater than 60 and non-tobacco-user status.    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) 13)  Atrial Fibrillation  (ICD-427.31) 14)  Coronary Artery Disease  (ICD-414.00) 15)  Hyperlipidemia  (ICD-272.4) 16)  Hypertension  (ICD-401.9) 17)  Myocardial Infarction, Acute  (ICD-410.90) 18)  Lung Nodule  (ICD-518.89) 19)  Diabetes Mellitus, Type I, Uncontrolled  (ICD-250.03) 20)  Back Pain  (ICD-724.5) 21)  Preventive Health Care  (ICD-V70.0) 22)  Goiter, Multinodular  (ICD-241.1) 23)  Anemia-nos  (ICD-285.9) 24)  Allergic Rhinitis  (ICD-477.9) 25)  Osteopenia  (ICD-733.90) 26)  Preventive Health Care  (ICD-V70.0) 27)  Vaginitis  (ICD-616.10) 28)  Ganglion Cyst, Wrist, Right  (ICD-727.41) 29)  Depression  (ICD-311) 30)  Anxiety  (ICD-300.00) 31)  Deep Venous Thrombophlebitis  (ICD-453.40) 32)  Paraplegia  (  ICD-344.1)  Medications Prior to Update: 1)  Fexofenadine Hcl 180 Mg Tabs (Fexofenadine Hcl) .... Take 1 Tablet By Mouth Once A Day 2)  Metformin Hcl 500 Mg Tabs (Metformin Hcl) .... 2 By Mouth Two Times A Day 3)  Pravastatin Sodium 10 Mg Tabs (Pravastatin Sodium) .... Take 1 Tablet By Mouth Once A Day 4)  Nitroglycerin 0.4 Mg Subl (Nitroglycerin) .... One Tablet Under Tongue Every 5 Minutes As Needed For Chest Pain---May Repeat Times Three 5)  Folic Acid 1 Mg Tabs (Folic Acid) .Marland Kitchen.. 1 By Mouth Daily 6)  Novolog 100 Unit/ml Soln (Insulin Aspart) .... As Directed 7)  Iron 325 (65 Fe) Mg Tabs (Ferrous  Sulfate) .Marland Kitchen.. 1 By Mouth Dialy 8)  Aspirin 81 Mg Tbec (Aspirin) .... Take One Tablet By Mouth Daily 9)  Citalopram Hydrobromide 10 Mg Tabs (Citalopram Hydrobromide) .Marland Kitchen.. 1 By Mouth Once Daily 10)  Flexeril 5 Mg Tabs (Cyclobenzaprine Hcl) .Marland Kitchen.. 1po Three Times A Day As Needed 11)  Tramadol Hcl 50 Mg Tabs (Tramadol Hcl) .Marland Kitchen.. 1-2 By Mouth Qid As Needed Pain - Please Make Return Office Visit For Further Refills After This 12)  Dexilant 60 Mg Cpdr (Dexlansoprazole) .Marland Kitchen.. 1 By Mouth Once Daily 13)  Tandem 162-115.2 Mg Caps (Ferrous Fum-Iron Polysacch) .Marland Kitchen.. 1 By Mouth Once Daily 14)  Cyanocobalamin 1000 Mcg/ml Soln (Cyanocobalamin) .... Inject One Ml Im Once A Month. 15)  Terumo Surguard2 Syringe 25g X 1" 3 Ml Misc (Syringe/needle (Disp)) .... Use With B12 Injections 16)  Onetouch Test  Strp (Glucose Blood) .... Use As Directed Two Times A Day  Current Medications (verified): 1)  Fexofenadine Hcl 180 Mg Tabs (Fexofenadine Hcl) .... Take 1 Tablet By Mouth Once A Day 2)  Metformin Hcl 500 Mg Tabs (Metformin Hcl) .... 2 By Mouth Two Times A Day 3)  Pravastatin Sodium 10 Mg Tabs (Pravastatin Sodium) .... Take 1 Tablet By Mouth Once A Day 4)  Nitroglycerin 0.4 Mg Subl (Nitroglycerin) .... One Tablet Under Tongue Every 5 Minutes As Needed For Chest Pain---May Repeat Times Three 5)  Folic Acid 1 Mg Tabs (Folic Acid) .Marland Kitchen.. 1 By Mouth Daily 6)  Novolog 100 Unit/ml Soln (Insulin Aspart) .... As Directed 7)  Aspirin 81 Mg Tbec (Aspirin) .... Take One Tablet By Mouth Daily 8)  Citalopram Hydrobromide 10 Mg Tabs (Citalopram Hydrobromide) .Marland Kitchen.. 1 By Mouth Once Daily 9)  Tramadol Hcl 50 Mg Tabs (Tramadol Hcl) .Marland Kitchen.. 1-2 By Mouth Qid As Needed Pain - Please Make Return Office Visit For Further Refills After This 10)  Lansoprazole 30 Mg Tbdp (Lansoprazole) .Marland Kitchen.. 1 By Mouth Once Daily 11)  Tandem 162-115.2 Mg Caps (Ferrous Fum-Iron Polysacch) .Marland Kitchen.. 1 By Mouth Once Daily 12)  Cyanocobalamin 1000 Mcg/ml Soln (Cyanocobalamin)  .... Inject One Ml Im Once A Month. 13)  Terumo Surguard2 Syringe 25g X 1" 3 Ml Misc (Syringe/needle (Disp)) .... Use With B12 Injections 14)  Onetouch Test  Strp (Glucose Blood) .... Use As Directed Two Times A Day 15)  Prednisone 10 Mg Tabs (Prednisone) .... 3po Qd For 3days, Then 2po Qd For 3days, Then 1po Qd For 3days, Then Stop  Allergies (verified): 1)  ! 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 s/p CABG x 2:  [place]-LAD, VG-RCA 03/30/2009 HYPERLIPIDEMIA (ICD-272.4) HYPERTENSION (ICD-401.9) MYOCARDIAL INFARCTION, ACUTE (ICD-410.90) LUNG NODULE (ICD-518.89) DIABETES MELLITUS, TYPE I, UNCONTROLLED (ICD-250.03) BACK PAIN (ICD-724.5)      a. 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 PREVENTIVE HEALTH CARE (ICD-V70.0) GOITER, MULTINODULAR (ICD-241.1) ANEMIA-NOS (ICD-285.9) ALLERGIC RHINITIS (ICD-477.9) OSTEOPENIA (ICD-733.90) PREVENTIVE HEALTH CARE (ICD-V70.0) VAGINITIS (ICD-616.10) GANGLION CYST, WRIST, RIGHT (ICD-727.41) DEPRESSION (ICD-311) ANXIETY (ICD-300.00) DEEP VENOUS THROMBOPHLEBITIS (ICD-453.40) PARAPLEGIA (ICD-344.1) with LLE weakness due to cord compression post CABG 2010 Anemia-NOS GERD  Review of Systems       all otherwise negative per pt -    Physical Exam  General:  alert and underweight appearing.   Head:  normocephalic and atraumatic.   Eyes:  vision grossly intact, pupils equal, and pupils round.   Ears:  R ear normal and L ear normal.   Nose:  no external  deformity and no nasal discharge.   Mouth:  no gingival abnormalities and pharynx pink and moist.   Neck:  supple and no masses.   Lungs:  normal respiratory effort and normal breath sounds.   Heart:  normal rate and regular rhythm.   Abdomen:  soft, non-tender, and normal bowel sounds.   Msk:  left first MTP with 1-2+ red, tender , swelling Extremities:  no edema, no erythema except for above   Impression & Recommendations:  Problem # 1:  FOOT PAIN, LEFT (ICD-729.5) etiology not clear  - ? gout vs DJD - for predpack asd as most likely gout first episode;  pt to f/u any worsening or persistent symptoms  Problem # 2:  DIABETES MELLITUS, TYPE I, UNCONTROLLED (ICD-250.03)  Her updated medication list for this problem includes:    Metformin Hcl 500 Mg Tabs (Metformin hcl) .Marland Kitchen... 2 by mouth two times a day    Novolog 100 Unit/ml Soln (Insulin aspart) .Marland Kitchen... As directed    Aspirin 81 Mg Tbec (Aspirin) .Marland Kitchen... Take one tablet by mouth daily stable overall by hx and exam, ok to continue meds/tx as is - and pt knows to adjust her insulin SSI style for increased sugar with prednisone  Labs Reviewed: Creat: 1.8 (01/31/2010)    Reviewed HgBA1c results: 6.8 (01/31/2010)  7.5 (08/10/2009)  Problem # 3:  HYPERTENSION (ICD-401.9)  BP today: 112/70 Prior BP: 166/84 (02/01/2010)  Labs Reviewed: K+: 5.5 (01/31/2010) Creat: : 1.8 (01/31/2010)   Chol: 177 (01/31/2010)   HDL: 87.30 (01/31/2010)   LDL: 72 (01/31/2010)   TG: 88.0 (01/31/2010) has lost some wt - stable overall by hx and exam, ok to continue meds/tx as is - no meds needed  BP today: 112/70 Prior BP: 166/84 (02/01/2010)  10 Yr Risk Heart Disease: N/A  Labs Reviewed: K+: 5.5 (01/31/2010) Creat: : 1.8 (01/31/2010)   Chol: 177 (01/31/2010)   HDL: 87.30 (01/31/2010)   LDL: 72 (01/31/2010)   TG: 88.0 (01/31/2010)  Problem # 4:  GERD (ICD-530.81)  Her updated medication list for this problem includes:    Lansoprazole 30 Mg Tbdp  (Lansoprazole) .Marland Kitchen... 1 by mouth once daily dexilant too expensive - treat as above, f/u any worsening signs or symptoms   Labs Reviewed: Hgb: 10.4 (02/01/2010)   Hct: 33.5 (02/01/2010)  Complete Medication List: 1)  Fexofenadine Hcl 180 Mg Tabs (Fexofenadine hcl) .... Take 1 tablet by mouth once a day 2)  Metformin  Hcl 500 Mg Tabs (Metformin hcl) .... 2 by mouth two times a day 3)  Pravastatin Sodium 10 Mg Tabs (Pravastatin sodium) .... Take 1 tablet by mouth once a day 4)  Nitroglycerin 0.4 Mg Subl (Nitroglycerin) .... One tablet under tongue every 5 minutes as needed for chest pain---may repeat times three 5)  Folic Acid 1 Mg Tabs (Folic acid) .Marland Kitchen.. 1 by mouth daily 6)  Novolog 100 Unit/ml Soln (Insulin aspart) .... As directed 7)  Aspirin 81 Mg Tbec (Aspirin) .... Take one tablet by mouth daily 8)  Citalopram Hydrobromide 10 Mg Tabs (Citalopram hydrobromide) .Marland Kitchen.. 1 by mouth once daily 9)  Tramadol Hcl 50 Mg Tabs (Tramadol hcl) .Marland Kitchen.. 1-2 by mouth qid as needed pain - please make return office visit for further refills after this 10)  Lansoprazole 30 Mg Tbdp (Lansoprazole) .Marland Kitchen.. 1 by mouth once daily 11)  Tandem 162-115.2 Mg Caps (Ferrous fum-iron polysacch) .Marland Kitchen.. 1 by mouth once daily 12)  Cyanocobalamin 1000 Mcg/ml Soln (Cyanocobalamin) .... Inject one ml im once a month. 13)  Terumo Surguard2 Syringe 25g X 1" 3 Ml Misc (Syringe/needle (disp)) .... Use with b12 injections 14)  Onetouch Test Strp (Glucose blood) .... Use as directed two times a day 15)  Prednisone 10 Mg Tabs (Prednisone) .... 3po qd for 3days, then 2po qd for 3days, then 1po qd for 3days, then stop  Lipid Assessment/Plan:      Based on NCEP/ATP III, the patient's risk factor category is "history of coronary disease, peripheral vascular disease, cerebrovascular disease, or aortic aneurysm along with either diabetes, current smoker, or LDL > 130 plus HDL < 40 plus triglycerides > 200".  [name] lipid goals are as follows:  Total cholesterol goal is 200; LDL cholesterol goal is 70; HDL cholesterol goal is 40; Triglyceride goal is 150.      Patient Instructions: 1)  Please take all new medications as prescribed - the prednisone and the lansoprazole ([facility] [address]) 2)  stop the dexilant 3)  Continue all previous medications as before this visit; your tramadol should be at the pharmacy already 4)  Followup Feb 27 as planned Prescriptions: LANSOPRAZOLE 30 MG TBDP (LANSOPRAZOLE) 1 by mouth once daily  #30 x 11   Entered and Authorized by:   Corwin Levins MD   Signed by:   Corwin Levins MD on 08/15/2010   Method used:   [facility]        [facility] [address] [phone]* (retail)       [address]       [place], [place]  [address]       Ph: [phone] or [phone]       Fax: [phone]   RxID:   [id number] PREDNISONE 10 MG TABS (PREDNISONE) 3po qd for 3days, then 2po qd for 3days, then 1po qd for 3days, then stop  #18 x 0   Entered and Authorized by:   Corwin Levins MD   Signed by:   Corwin Levins MD on 08/15/2010   Method used:   [facility]ronically to        [facility]  [address] [phone]* (retail)       [address]       [place], [place]  [address]       Ph: [phone] or [phone]       Fax: [phone]  RxID:   [id number]    Orders Added: 1)  Est. Patient Level IV [[address]]

## 2010-08-24 DIAGNOSIS — Z0279 Encounter for issue of other medical certificate: Secondary | ICD-10-CM

## 2010-09-03 ENCOUNTER — Ambulatory Visit (INDEPENDENT_AMBULATORY_CARE_PROVIDER_SITE_OTHER): Payer: Medicare Other | Admitting: Internal Medicine

## 2010-09-03 ENCOUNTER — Encounter: Payer: Self-pay | Admitting: Internal Medicine

## 2010-09-03 ENCOUNTER — Ambulatory Visit: Payer: 59 | Admitting: Cardiology

## 2010-09-03 ENCOUNTER — Other Ambulatory Visit: Payer: Self-pay | Admitting: Internal Medicine

## 2010-09-03 ENCOUNTER — Other Ambulatory Visit: Payer: Medicare Other

## 2010-09-03 ENCOUNTER — Telehealth: Payer: Self-pay | Admitting: Internal Medicine

## 2010-09-03 DIAGNOSIS — E1065 Type 1 diabetes mellitus with hyperglycemia: Secondary | ICD-10-CM

## 2010-09-03 DIAGNOSIS — D649 Anemia, unspecified: Secondary | ICD-10-CM

## 2010-09-03 DIAGNOSIS — E785 Hyperlipidemia, unspecified: Secondary | ICD-10-CM

## 2010-09-03 DIAGNOSIS — I2510 Atherosclerotic heart disease of native coronary artery without angina pectoris: Secondary | ICD-10-CM

## 2010-09-03 DIAGNOSIS — I10 Essential (primary) hypertension: Secondary | ICD-10-CM

## 2010-09-03 LAB — CBC WITH DIFFERENTIAL/PLATELET
Basophils Absolute: 0 10*3/uL (ref 0.0–0.1)
Eosinophils Absolute: 0.1 10*3/uL (ref 0.0–0.7)
Eosinophils Relative: 1.1 % (ref 0.0–5.0)
HCT: 32.7 % — ABNORMAL LOW (ref 36.0–46.0)
Lymphs Abs: 1.1 10*3/uL (ref 0.7–4.0)
MCHC: 32.8 g/dL (ref 30.0–36.0)
MCV: 85.7 fl (ref 78.0–100.0)
Monocytes Absolute: 0.4 10*3/uL (ref 0.1–1.0)
Neutrophils Relative %: 83.6 % — ABNORMAL HIGH (ref 43.0–77.0)
Platelets: 172 10*3/uL (ref 150.0–400.0)
RDW: 14.7 % — ABNORMAL HIGH (ref 11.5–14.6)
WBC: 10.6 10*3/uL — ABNORMAL HIGH (ref 4.5–10.5)

## 2010-09-03 LAB — BASIC METABOLIC PANEL
BUN: 19 mg/dL (ref 6–23)
Chloride: 106 mEq/L (ref 96–112)
Potassium: 5.7 mEq/L — ABNORMAL HIGH (ref 3.5–5.1)
Sodium: 141 mEq/L (ref 135–145)

## 2010-09-03 LAB — LIPID PANEL
Cholesterol: 182 mg/dL (ref 0–200)
HDL: 111.8 mg/dL (ref >39.00)
LDL Cholesterol: 59 mg/dL (ref 0–99)
VLDL: 11.6 mg/dL (ref 0.0–40.0)

## 2010-09-06 ENCOUNTER — Ambulatory Visit
Admission: RE | Admit: 2010-09-06 | Discharge: 2010-09-06 | Disposition: A | Discharge status: Home / Self Care | Payer: Medicare Other | Source: Ambulatory Visit | Attending: Cardiothoracic Surgery | Admitting: Cardiothoracic Surgery

## 2010-09-06 ENCOUNTER — Ambulatory Visit: Payer: Medicare Other | Admitting: Cardiothoracic Surgery

## 2010-09-06 DIAGNOSIS — R911 Solitary pulmonary nodule: Secondary | ICD-10-CM

## 2010-09-13 ENCOUNTER — Encounter (INDEPENDENT_AMBULATORY_CARE_PROVIDER_SITE_OTHER): Payer: Medicare Other | Admitting: Cardiothoracic Surgery

## 2010-09-13 DIAGNOSIS — D381 Neoplasm of uncertain behavior of trachea, bronchus and lung: Secondary | ICD-10-CM

## 2010-09-13 NOTE — Assessment & Plan Note (Signed)
OFFICE VISIT  Veronica Copeland, Veronica Copeland DOB:  [birth year]                                        September 13, 2010 CHART #:  [id number]  Ms. Stull returns to the office today in followup for a right pulmonary nodule.  On March 29, 2009, the patient underwent off-pump coronary artery bypass grafting x2.  She was originally seen in March 2010 for consideration of coronary artery bypass grafting, at that time she presented with acute non-ST elevation myocardial infarction, but the time of exam was also found to have spinal cord syndrome with apheresis. Ultimately, she underwent resection of spinal mass though no definite pathology for this was determined.  Since that time, this was persisted and lower extremity weakness.  Since last seen her overall functional status appears to be stabilize, she gets around with a left leg brace and a walker.  She is able to get around her house, but is still significantly limited, she has some mild edema in the left, weaker of 2 legs, the left appears stable.  She has no overt symptoms of congestive heart failure.  I was seen her back today because year ago a CT scan was done in the [facility], that raised the issue of a right pulmonary lung nodule.  She returns this week to review of the followup CT scan of the chest that was done in last week.  On exam, her blood pressure is 142/69, pulse 100, respiratory rate is 20, and O2 sats 98%.  Her sternum is stable and well healed.  Lungs are clear.  There is no cervical or supraclavicular adenopathy is noted. She continues with lower extremity weakness, left greater than right.  She continues on; 1. Aspirin 81 mg a day. 2. Diovan 5 mg a day. 3. Pravastatin 10 mg a day. 4. Insulin. 5. Iron. 6. Tramadol. 7. Omeprazole. 8. B12 injections once a month.  CT scan done at [facility] on September 06, 2010 and compared to the CT scan on September 18, 2009, shows a stable right lower lobe  pulmonary nodule measuring 6 mm in size and it is unchanged from last year.  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  I have discussed these findings with the patient.  I have not made a return appointment see me, but would be glad to see her Dr. Rosalyn Charters request at anytime.  Sheliah Plane, MD Electronically Signed  EG/MEDQ  D:  09/13/2010  T:  09/13/2010  Job:  [id number]  cc:   Arturo Morton. Riley Kill, MD, [facility] Hilda Lias, M.D. Penni Bombard, MD Melvyn Novas, M.D.

## 2010-09-13 NOTE — Assessment & Plan Note (Signed)
Summary: FU Veronica Copeland   Vital Signs:  Patient profile:   65 year old female Height:      63.5 inches Weight:      112.75 pounds BMI:     19.73 O2 Sat:      97 % on Room air Temp:     97.3 degrees F oral Pulse rate:   105 / minute BP sitting:   110 / 80  (left arm) Cuff size:   regular  Vitals Entered By: Zella Ball Ewing CMA Duncan Dull) (September 03, 2010 10:24 AM)  O2 Flow:  Room air  Preventive Care Screening  Last Flu Shot:    Date:  05/08/2010    Results:  given   CC: followup/RE, Lipid Management   Primary Care Provider:  Corwin Levins MD  CC:  followup/RE and Lipid Management.  History of Present Illness: here to f/u - overall doing ok,  Pt denies CP, worsening sob, doe, wheezing, orthopnea, pnd, worsening LE edema, palps, dizziness or syncope  Pt denies new neuro symptoms such as headache, facial or extremity weakness  Pt denies polydipsia, polyuria, or low sugar symptoms such as shakiness improved with eating.  Overall good compliance with meds, trying to follow low chol, DM diet, wt stable, little excercise however  CBG's in low 100's.  Overall good compliance with meds, and good tolerability.  No fever, wt loss, night sweats, loss of appetite or other constitutional symptoms  Denies worsening depressive symptoms, suicidal ideation, or panic.   Did have fall last wk without injury  scheduled to f/u NS about mar 1, she plans to inquire about need for PT at that time.     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) 13)  Atrial  Fibrillation  (ICD-427.31) 14)  Coronary Artery Disease  (ICD-414.00) 15)  Hyperlipidemia  (ICD-272.4) 16)  Hypertension  (ICD-401.9) 17)  Myocardial Infarction, Acute  (ICD-410.90) 18)  Lung Nodule  (ICD-518.89) 19)  Diabetes Mellitus, Type I, Uncontrolled  (ICD-250.03) 20)  Back Pain  (ICD-724.5) 21)  Preventive Health Care  (ICD-V70.0) 22)  Goiter, Multinodular  (ICD-241.1) 23)  Anemia-nos  (ICD-285.9) 24)  Allergic Rhinitis  (ICD-477.9) 25)  Osteopenia  (ICD-733.90) 26)  Preventive Health Care  (ICD-V70.0) 27)  Vaginitis  (ICD-616.10) 28)  Ganglion Cyst, Wrist, Right  (ICD-727.41) 29)  Depression  (ICD-311) 30)  Anxiety  (ICD-300.00) 31)  Deep Venous Thrombophlebitis  (ICD-453.40) 32)  Paraplegia  (ICD-344.1)  Medications Prior to Update: 1)  Fexofenadine Hcl 180 Mg Tabs (Fexofenadine Hcl) .... Take 1 Tablet By Mouth Once A Day 2)  Metformin Hcl 500 Mg Tabs (Metformin Hcl) .... 2 By Mouth Two Times A Day 3)  Pravastatin Sodium 10 Mg Tabs (Pravastatin Sodium) .... Take 1 Tablet By Mouth Once A Day 4)  Nitroglycerin 0.4 Mg Subl (Nitroglycerin) .... One Tablet Under Tongue Every 5 Minutes As Needed For Chest Pain---May Repeat Times Three 5)  Folic Acid 1 Mg Tabs (Folic Acid) .Marland Kitchen.. 1 By Mouth Daily 6)  Novolog 100 Unit/ml Soln (Insulin  Aspart) .... As Directed 7)  Aspirin 81 Mg Tbec (Aspirin) .... Take One Tablet By Mouth Daily 8)  Citalopram Hydrobromide 10 Mg Tabs (Citalopram Hydrobromide) .Marland Kitchen.. 1 By Mouth Once Daily 9)  Tramadol Hcl 50 Mg Tabs (Tramadol Hcl) .Marland Kitchen.. 1-2 By Mouth Qid As Needed Pain - Please Make Return Office Visit For Further Refills After This 10)  Lansoprazole 30 Mg Tbdp (Lansoprazole) .Marland Kitchen.. 1 By Mouth Once Daily 11)  Tandem 162-115.2 Mg Caps (Ferrous Fum-Iron Polysacch) .Marland Kitchen.. 1 By Mouth Once Daily 12)  Cyanocobalamin 1000 Mcg/ml Soln (Cyanocobalamin) .... Inject One Ml Im Once A Month. 13)  Terumo Surguard2 Syringe 25g X 1" 3 Ml Misc (Syringe/needle (Disp)) .... Use  With B12 Injections 14)  Onetouch Test  Strp (Glucose Blood) .... Use As Directed Two Times A Day 15)  Prednisone 10 Mg Tabs (Prednisone) .... 3po Qd For 3days, Then 2po Qd For 3days, Then 1po Qd For 3days, Then Stop  Current Medications (verified): 1)  Fexofenadine Hcl 180 Mg Tabs (Fexofenadine Hcl) .... Take 1 Tablet By Mouth Once A Day 2)  Metformin Hcl 500 Mg Tabs (Metformin Hcl) .... 2 By Mouth Two Times A Day 3)  Pravastatin Sodium 10 Mg Tabs (Pravastatin Sodium) .... Take 1 Tablet By Mouth Once A Day 4)  Nitroglycerin 0.4 Mg Subl (Nitroglycerin) .... One Tablet Under Tongue Every 5 Minutes As Needed For Chest Pain---May Repeat Times Three 5)  Folic Acid 1 Mg Tabs (Folic Acid) .Marland Kitchen.. 1 By Mouth Daily 6)  Novolog 100 Unit/ml Soln (Insulin Aspart) .... As Directed 7)  Aspirin 81 Mg Tbec (Aspirin) .... Take One Tablet By Mouth Daily 8)  Citalopram Hydrobromide 10 Mg Tabs (Citalopram Hydrobromide) .Marland Kitchen.. 1 By Mouth Once Daily 9)  Tramadol Hcl 50 Mg Tabs (Tramadol Hcl) .Marland Kitchen.. 1-2 By Mouth Qid As Needed Pain - Please Make Return Office Visit For Further Refills After This 10)  Lansoprazole 30 Mg Tbdp (Lansoprazole) .Marland Kitchen.. 1 By Mouth Once Daily 11)  Tandem 162-115.2 Mg Caps (Ferrous Fum-Iron Polysacch) .Marland Kitchen.. 1 By Mouth Once Daily 12)  Cyanocobalamin 1000 Mcg/ml Soln (Cyanocobalamin) .... Inject One Ml Im Once A Month. 13)  Terumo Surguard2 Syringe 25g X 1" 3 Ml Misc (Syringe/needle (Disp)) .... Use With B12 Injections 14)  Onetouch Test  Strp (Glucose Blood) .... Use As Directed Two Times A Day  Allergies (verified): 1)  ! Pcn 2)  * Oxycodone  Past History:  Past Surgical History: Last updated: 05/25/2009 Rotator cuff repair-R Caesarean section s/p right wrist ganglion cyst s/p right retinal detachment 2005 CABG - 03/2009 Lamitectomy-2010  Social History: Last updated: 02/01/2010 Never Smoked Alcohol use-no Divorced - lives alone 2 children work - Medical laboratory scientific officer semi  retired Illicit Drug Use - no  Risk Factors: Smoking Status: never (11/04/2007)  Past Medical History: CORONARY ARTERY DISEASE (ICD-414.00)      a. s/p CABG x 2:  [place]-LAD, VG-RCA 03/30/2009 HYPERLIPIDEMIA (ICD-272.4) HYPERTENSION (ICD-401.9) MYOCARDIAL INFARCTION, ACUTE (ICD-410.90) LUNG NODULE (ICD-518.89) DIABETES MELLITUS, TYPE I, UNCONTROLLED (ICD-250.03) BACK PAIN (ICD-724.5)      a. 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 PREVENTIVE HEALTH CARE (ICD-V70.0) GOITER, MULTINODULAR (ICD-241.1) ANEMIA-NOS (ICD-285.9) ALLERGIC RHINITIS (ICD-477.9) OSTEOPENIA (ICD-733.90) PREVENTIVE HEALTH CARE (ICD-V70.0) VAGINITIS (ICD-616.10) GANGLION CYST, WRIST, RIGHT (ICD-727.41) DEPRESSION (ICD-311) ANXIETY (ICD-300.00) DEEP VENOUS THROMBOPHLEBITIS (ICD-453.40) PARAPLEGIA (ICD-344.1) with LLE weakness due to cord compression post CABG 2010 Anemia-NOS GERD  Review of Systems       all otherwise negative per pt -  Physical Exam  General:  alert and underweight appearing.   Head:  normocephalic and atraumatic.   Eyes:  vision grossly intact, pupils equal, and pupils round.   Ears:  R ear normal and L ear normal.   Nose:  no external deformity and no nasal discharge.   Mouth:  no gingival abnormalities and pharynx pink and moist.   Neck:  supple and no masses.   Lungs:  normal respiratory effort and normal breath sounds.   Heart:  normal rate and regular rhythm.   Extremities:  no edema, no erythema except for above   Impression & Recommendations:  Problem # 1:  HYPERTENSION (ICD-401.9)  BP today: 110/80 Prior BP: 112/70 (08/15/2010)  Prior 10 Yr Risk Heart Disease: N/A (08/15/2010)  Labs Reviewed: K+: 5.5 (01/31/2010) Creat: : 1.8 (01/31/2010)   Chol: 177 (01/31/2010)   HDL: 87.30 (01/31/2010)   LDL: 72 (01/31/2010)   TG: 88.0 (01/31/2010) stable overall by hx and exam, ok to continue meds/tx as is - no meds needed at this time  Problem # 2:   HYPERLIPIDEMIA (ICD-272.4)  Her updated medication list for this problem includes:    Pravastatin Sodium 10 Mg Tabs (Pravastatin sodium) .Marland Kitchen... Take 1 tablet by mouth once a day  Labs Reviewed: SGOT: 19 (02/01/2010)   SGPT: 10 (02/01/2010)  Lipid Goals: Chol Goal: 200 (08/15/2010)   HDL Goal: 40 (08/15/2010)   LDL Goal: 70 (08/15/2010)   TG Goal: 150 (08/15/2010)  Prior 10 Yr Risk Heart Disease: N/A (08/15/2010)   HDL:87.30 (01/31/2010), 83.90 (08/10/2009)  LDL:72 (01/31/2010), 61 (08/10/2009)  Chol:177 (01/31/2010), 162 (08/10/2009)  Trig:88.0 (01/31/2010), 85.0 (08/10/2009) stable overall by hx and exam, ok to continue meds/tx as is . Pt to continue diet efforts, good med tolerance; to check labs - goal LDL less than 70   Problem # 3:  DIABETES MELLITUS, TYPE I, UNCONTROLLED (ICD-250.03)  Her updated medication list for this problem includes:    Metformin Hcl 500 Mg Tabs (Metformin hcl) .Marland Kitchen... 2 by mouth two times a day    Novolog 100 Unit/ml Soln (Insulin aspart) .Marland Kitchen... As directed    Aspirin 81 Mg Tbec (Aspirin) .Marland Kitchen... Take one tablet by mouth daily      Labs Reviewed: Creat: 1.8 (01/31/2010)    Reviewed HgBA1c results: 6.8 (01/31/2010)  7.5 (08/10/2009) stable overall by hx and exam, ok to continue meds/tx as is. Pt to cont DM diet, excercise, wt control efforts; to check labs today   Orders: TLB-BMP (Basic Metabolic Panel-BMET) (80048-METABOL) TLB-Lipid Panel (80061-LIPID) TLB-A1C / Hgb A1C (Glycohemoglobin) (83036-A1C)  Problem # 4:  CORONARY ARTERY DISEASE (ICD-414.00)  Her updated medication list for this problem includes:    Nitroglycerin 0.4 Mg Subl (Nitroglycerin) ..... One tablet under tongue every 5 minutes as needed for chest pain---may repeat times three    Aspirin 81 Mg Tbec (Aspirin) .Marland Kitchen... Take one tablet by mouth daily  Labs Reviewed: Chol: 177 (01/31/2010)   HDL: 87.30 (01/31/2010)   LDL: 72 (01/31/2010)   TG: 88.0 (01/31/2010)  Lipid Goals: Chol Goal: 200  (08/15/2010)   HDL Goal: 40 (08/15/2010)   LDL Goal: 70 (08/15/2010)   TG Goal: 150 (08/15/2010) stable overall by hx and exam, ok to continue meds/tx as is   Complete Medication List: 1)  Fexofenadine Hcl 180 Mg Tabs (Fexofenadine hcl) .... Take 1 tablet by mouth once a day 2)  Metformin Hcl 500 Mg Tabs (Metformin hcl) .... 2 by mouth two times a day 3)  Pravastatin Sodium  10 Mg Tabs (Pravastatin sodium) .... Take 1 tablet by mouth once a day 4)  Nitroglycerin 0.4 Mg Subl (Nitroglycerin) .... One tablet under tongue every 5 minutes as needed for chest pain---may repeat times three 5)  Folic Acid 1 Mg Tabs (Folic acid) .Marland Kitchen.. 1 by mouth daily 6)  Novolog 100 Unit/ml Soln (Insulin aspart) .... As directed 7)  Aspirin 81 Mg Tbec (Aspirin) .... Take one tablet by mouth daily 8)  Citalopram Hydrobromide 10 Mg Tabs (Citalopram hydrobromide) .Marland Kitchen.. 1 by mouth once daily 9)  Tramadol Hcl 50 Mg Tabs (Tramadol hcl) .Marland Kitchen.. 1-2 by mouth qid as needed pain - please make return office visit for further refills after this 10)  Lansoprazole 30 Mg Tbdp (Lansoprazole) .Marland Kitchen.. 1 by mouth once daily 11)  Tandem 162-115.2 Mg Caps (Ferrous fum-iron polysacch) .Marland Kitchen.. 1 by mouth once daily 12)  Cyanocobalamin 1000 Mcg/ml Soln (Cyanocobalamin) .... Inject one ml im once a month. 13)  Terumo Surguard2 Syringe 25g X 1" 3 Ml Misc (Syringe/needle (disp)) .... Use with b12 injections 14)  Onetouch Test Strp (Glucose blood) .... Use as directed two times a day 15)  Glimepiride 1 Mg Tabs (Glimepiride) .... 1/2 by mouth once daily  Other Orders: TLB-CBC Platelet - w/Differential (85025-CBCD)  Patient Instructions: 1)  please call for your yearly mammogram 2)  Please go to the Lab in the basement for your blood and/or urine tests today 3)  Please call the number on the [facility] Card for results of your testing 4)  Continue all previous medications as before this visit   5)  Please schedule a follow-up appointment in 6 months, or sooner  if needed 6)  Please keep your specialist appt mar 1 as planned   Orders Added: 1)  TLB-BMP (Basic Metabolic Panel-BMET) [80048-METABOL] 2)  TLB-Lipid Panel [80061-LIPID] 3)  TLB-A1C / Hgb A1C (Glycohemoglobin) [83036-A1C] 4)  TLB-CBC Platelet - w/Differential [85025-CBCD] 5)  Est. Patient Level IV [36644]

## 2010-09-13 NOTE — Progress Notes (Signed)
Summary: [employer] Disability  Phone Note Other Incoming   Caller: Kathlene November at [employer] Term Disability dept [phone] Summary of Call: Monia Pouch called to check the status of a request for clinical information faxed 02/15 Attn LaToya. Per Kathlene November information needs to be recieved by 03/03 in order for pt's claim to be processed. Initial call taken by: Margaret Pyle, CMA,  September 03, 2010 12:57 PM  Follow-up for Phone Call        called [employer] back and discussed claim. Follow-up by: Daphane Shepherd,  September 03, 2010 1:43 PM

## 2010-10-02 ENCOUNTER — Encounter: Payer: Self-pay | Admitting: Cardiology

## 2010-10-11 ENCOUNTER — Encounter: Payer: Self-pay | Admitting: Cardiology

## 2010-10-11 ENCOUNTER — Ambulatory Visit (INDEPENDENT_AMBULATORY_CARE_PROVIDER_SITE_OTHER): Payer: Medicare Other | Admitting: Cardiology

## 2010-10-11 VITALS — BP 200/86 | HR 95 | Resp 18 | Ht 63.0 in | Wt 116.0 lb

## 2010-10-11 DIAGNOSIS — I2510 Atherosclerotic heart disease of native coronary artery without angina pectoris: Secondary | ICD-10-CM

## 2010-10-11 DIAGNOSIS — I10 Essential (primary) hypertension: Secondary | ICD-10-CM

## 2010-10-11 DIAGNOSIS — I4891 Unspecified atrial fibrillation: Secondary | ICD-10-CM

## 2010-10-11 DIAGNOSIS — E785 Hyperlipidemia, unspecified: Secondary | ICD-10-CM

## 2010-10-11 LAB — CBC
HCT: 23.1 % — ABNORMAL LOW (ref 36.0–46.0)
Hemoglobin: 7.7 g/dL — CL (ref 12.0–15.0)
MCHC: 33.1 g/dL (ref 30.0–36.0)
MCV: 80.7 fL (ref 78.0–100.0)
Platelets: 275 10*3/uL (ref 150–400)
RBC: 2.86 MIL/uL — ABNORMAL LOW (ref 3.87–5.11)
RDW: 16.2 % — ABNORMAL HIGH (ref 11.5–15.5)
WBC: 10.4 10*3/uL (ref 4.0–10.5)

## 2010-10-11 LAB — BASIC METABOLIC PANEL
BUN: 22 mg/dL (ref 6–23)
CO2: 22 mEq/L (ref 19–32)
Calcium: 8.6 mg/dL (ref 8.4–10.5)
Chloride: 103 mEq/L (ref 96–112)
Creatinine, Ser: 1.61 mg/dL — ABNORMAL HIGH (ref 0.4–1.2)
GFR calc Af Amer: 39 mL/min — ABNORMAL LOW (ref >60)
GFR calc non Af Amer: 32 mL/min — ABNORMAL LOW (ref >60)
Glucose, Bld: 155 mg/dL — ABNORMAL HIGH (ref 70–99)
Potassium: 3.7 mEq/L (ref 3.5–5.1)
Sodium: 134 mEq/L — ABNORMAL LOW (ref 135–145)

## 2010-10-11 LAB — GLUCOSE, CAPILLARY
Glucose-Capillary: 136 mg/dL — ABNORMAL HIGH (ref 70–99)
Glucose-Capillary: 186 mg/dL — ABNORMAL HIGH (ref 70–99)

## 2010-10-11 NOTE — Patient Instructions (Signed)
Your physician wants you to follow-up in: 2 YEARS  You will receive a reminder letter in the mail two months in advance. If you don't receive a letter, please call our office to schedule the follow-up appointment.   Your physician recommends that you continue on your current medications as directed. Please refer to the Current Medication list given to you today.  

## 2010-10-12 LAB — CBC
HCT: 23.6 % — ABNORMAL LOW (ref 36.0–46.0)
HCT: 23.7 % — ABNORMAL LOW (ref 36.0–46.0)
HCT: 23.9 % — ABNORMAL LOW (ref 36.0–46.0)
HCT: 25 % — ABNORMAL LOW (ref 36.0–46.0)
HCT: 25 % — ABNORMAL LOW (ref 36.0–46.0)
HCT: 25.4 % — ABNORMAL LOW (ref 36.0–46.0)
HCT: 25.7 % — ABNORMAL LOW (ref 36.0–46.0)
HCT: 28.1 % — ABNORMAL LOW (ref 36.0–46.0)
HCT: 28.8 % — ABNORMAL LOW (ref 36.0–46.0)
HCT: 35.2 % — ABNORMAL LOW (ref 36.0–46.0)
Hemoglobin: 11.6 g/dL — ABNORMAL LOW (ref 12.0–15.0)
Hemoglobin: 7.8 g/dL — CL (ref 12.0–15.0)
Hemoglobin: 8 g/dL — ABNORMAL LOW (ref 12.0–15.0)
Hemoglobin: 8 g/dL — ABNORMAL LOW (ref 12.0–15.0)
Hemoglobin: 8.2 g/dL — ABNORMAL LOW (ref 12.0–15.0)
Hemoglobin: 8.3 g/dL — ABNORMAL LOW (ref 12.0–15.0)
Hemoglobin: 8.4 g/dL — ABNORMAL LOW (ref 12.0–15.0)
Hemoglobin: 8.6 g/dL — ABNORMAL LOW (ref 12.0–15.0)
Hemoglobin: 9.2 g/dL — ABNORMAL LOW (ref 12.0–15.0)
Hemoglobin: 9.4 g/dL — ABNORMAL LOW (ref 12.0–15.0)
MCHC: 32.6 g/dL (ref 30.0–36.0)
MCHC: 32.6 g/dL (ref 30.0–36.0)
MCHC: 32.6 g/dL (ref 30.0–36.0)
MCHC: 32.7 g/dL (ref 30.0–36.0)
MCHC: 32.9 g/dL (ref 30.0–36.0)
MCHC: 33.1 g/dL (ref 30.0–36.0)
MCHC: 33.4 g/dL (ref 30.0–36.0)
MCHC: 33.5 g/dL (ref 30.0–36.0)
MCHC: 33.6 g/dL (ref 30.0–36.0)
MCHC: 33.9 g/dL (ref 30.0–36.0)
MCV: 79.6 fL (ref 78.0–100.0)
MCV: 79.6 fL (ref 78.0–100.0)
MCV: 79.8 fL (ref 78.0–100.0)
MCV: 80.1 fL (ref 78.0–100.0)
MCV: 80.1 fL (ref 78.0–100.0)
MCV: 80.4 fL (ref 78.0–100.0)
MCV: 80.5 fL (ref 78.0–100.0)
MCV: 80.7 fL (ref 78.0–100.0)
MCV: 81 fL (ref 78.0–100.0)
MCV: 81.2 fL (ref 78.0–100.0)
Platelets: 125 10*3/uL — ABNORMAL LOW (ref 150–400)
Platelets: 132 10*3/uL — ABNORMAL LOW (ref 150–400)
Platelets: 139 10*3/uL — ABNORMAL LOW (ref 150–400)
Platelets: 144 10*3/uL — ABNORMAL LOW (ref 150–400)
Platelets: 167 10*3/uL (ref 150–400)
Platelets: 192 10*3/uL (ref 150–400)
Platelets: 196 10*3/uL (ref 150–400)
Platelets: 201 10*3/uL (ref 150–400)
Platelets: 223 10*3/uL (ref 150–400)
Platelets: 231 10*3/uL (ref 150–400)
RBC: 2.94 MIL/uL — ABNORMAL LOW (ref 3.87–5.11)
RBC: 2.97 MIL/uL — ABNORMAL LOW (ref 3.87–5.11)
RBC: 3 MIL/uL — ABNORMAL LOW (ref 3.87–5.11)
RBC: 3.08 MIL/uL — ABNORMAL LOW (ref 3.87–5.11)
RBC: 3.11 MIL/uL — ABNORMAL LOW (ref 3.87–5.11)
RBC: 3.14 MIL/uL — ABNORMAL LOW (ref 3.87–5.11)
RBC: 3.23 MIL/uL — ABNORMAL LOW (ref 3.87–5.11)
RBC: 3.52 MIL/uL — ABNORMAL LOW (ref 3.87–5.11)
RBC: 3.58 MIL/uL — ABNORMAL LOW (ref 3.87–5.11)
RBC: 4.37 MIL/uL (ref 3.87–5.11)
RDW: 14.8 % (ref 11.5–15.5)
RDW: 14.9 % (ref 11.5–15.5)
RDW: 15.3 % (ref 11.5–15.5)
RDW: 15.4 % (ref 11.5–15.5)
RDW: 15.6 % — ABNORMAL HIGH (ref 11.5–15.5)
RDW: 15.8 % — ABNORMAL HIGH (ref 11.5–15.5)
RDW: 16.1 % — ABNORMAL HIGH (ref 11.5–15.5)
RDW: 16.3 % — ABNORMAL HIGH (ref 11.5–15.5)
RDW: 16.5 % — ABNORMAL HIGH (ref 11.5–15.5)
RDW: 16.7 % — ABNORMAL HIGH (ref 11.5–15.5)
WBC: 10.2 10*3/uL (ref 4.0–10.5)
WBC: 11.1 10*3/uL — ABNORMAL HIGH (ref 4.0–10.5)
WBC: 11.6 10*3/uL — ABNORMAL HIGH (ref 4.0–10.5)
WBC: 11.8 10*3/uL — ABNORMAL HIGH (ref 4.0–10.5)
WBC: 12.1 10*3/uL — ABNORMAL HIGH (ref 4.0–10.5)
WBC: 13.1 10*3/uL — ABNORMAL HIGH (ref 4.0–10.5)
WBC: 13.3 10*3/uL — ABNORMAL HIGH (ref 4.0–10.5)
WBC: 8.4 10*3/uL (ref 4.0–10.5)
WBC: 8.8 10*3/uL (ref 4.0–10.5)
WBC: 9.7 10*3/uL (ref 4.0–10.5)

## 2010-10-12 LAB — POCT I-STAT 3, ART BLOOD GAS (G3+)
Acid-base deficit: 2 mmol/L (ref 0.0–2.0)
Acid-base deficit: 2 mmol/L (ref 0.0–2.0)
Bicarbonate: 21.2 mEq/L (ref 20.0–24.0)
Bicarbonate: 22.7 mEq/L (ref 20.0–24.0)
Bicarbonate: 24.7 mEq/L — ABNORMAL HIGH (ref 20.0–24.0)
O2 Saturation: 100 %
O2 Saturation: 99 %
O2 Saturation: 99 %
Patient temperature: 32.9
Patient temperature: 33.5
Patient temperature: 38.2
TCO2: 22 mmol/L (ref 0–100)
TCO2: 24 mmol/L (ref 0–100)
TCO2: 26 mmol/L (ref 0–100)
pCO2 arterial: 24.6 mmHg — ABNORMAL LOW (ref 35.0–45.0)
pCO2 arterial: 34.8 mmHg — ABNORMAL LOW (ref 35.0–45.0)
pCO2 arterial: 41.9 mmHg (ref 35.0–45.0)
pH, Arterial: 7.348 — ABNORMAL LOW (ref 7.350–7.400)
pH, Arterial: 7.445 — ABNORMAL HIGH (ref 7.350–7.400)
pH, Arterial: 7.528 — ABNORMAL HIGH (ref 7.350–7.400)
pO2, Arterial: 102 mmHg — ABNORMAL HIGH (ref 80.0–100.0)
pO2, Arterial: 150 mmHg — ABNORMAL HIGH (ref 80.0–100.0)
pO2, Arterial: 523 mmHg — ABNORMAL HIGH (ref 80.0–100.0)

## 2010-10-12 LAB — POCT I-STAT 4, (NA,K, GLUC, HGB,HCT)
Glucose, Bld: 102 mg/dL — ABNORMAL HIGH (ref 70–99)
Glucose, Bld: 113 mg/dL — ABNORMAL HIGH (ref 70–99)
Glucose, Bld: 205 mg/dL — ABNORMAL HIGH (ref 70–99)
Glucose, Bld: 93 mg/dL (ref 70–99)
Glucose, Bld: 96 mg/dL (ref 70–99)
HCT: 28 % — ABNORMAL LOW (ref 36.0–46.0)
HCT: 29 % — ABNORMAL LOW (ref 36.0–46.0)
HCT: 30 % — ABNORMAL LOW (ref 36.0–46.0)
HCT: 32 % — ABNORMAL LOW (ref 36.0–46.0)
HCT: 34 % — ABNORMAL LOW (ref 36.0–46.0)
Hemoglobin: 10.2 g/dL — ABNORMAL LOW (ref 12.0–15.0)
Hemoglobin: 10.9 g/dL — ABNORMAL LOW (ref 12.0–15.0)
Hemoglobin: 11.6 g/dL — ABNORMAL LOW (ref 12.0–15.0)
Hemoglobin: 9.5 g/dL — ABNORMAL LOW (ref 12.0–15.0)
Hemoglobin: 9.9 g/dL — ABNORMAL LOW (ref 12.0–15.0)
Potassium: 2.9 mEq/L — ABNORMAL LOW (ref 3.5–5.1)
Potassium: 3.2 mEq/L — ABNORMAL LOW (ref 3.5–5.1)
Potassium: 3.2 mEq/L — ABNORMAL LOW (ref 3.5–5.1)
Potassium: 3.4 mEq/L — ABNORMAL LOW (ref 3.5–5.1)
Potassium: 3.6 mEq/L (ref 3.5–5.1)
Sodium: 142 mEq/L (ref 135–145)
Sodium: 142 mEq/L (ref 135–145)
Sodium: 144 mEq/L (ref 135–145)
Sodium: 145 mEq/L (ref 135–145)
Sodium: 148 mEq/L — ABNORMAL HIGH (ref 135–145)

## 2010-10-12 LAB — BASIC METABOLIC PANEL
BUN: 18 mg/dL (ref 6–23)
BUN: 18 mg/dL (ref 6–23)
BUN: 19 mg/dL (ref 6–23)
BUN: 19 mg/dL (ref 6–23)
BUN: 20 mg/dL (ref 6–23)
BUN: 23 mg/dL (ref 6–23)
BUN: 25 mg/dL — ABNORMAL HIGH (ref 6–23)
BUN: 26 mg/dL — ABNORMAL HIGH (ref 6–23)
CO2: 20 mEq/L (ref 19–32)
CO2: 21 mEq/L (ref 19–32)
CO2: 21 mEq/L (ref 19–32)
CO2: 22 mEq/L (ref 19–32)
CO2: 23 mEq/L (ref 19–32)
CO2: 23 mEq/L (ref 19–32)
CO2: 24 mEq/L (ref 19–32)
CO2: 26 mEq/L (ref 19–32)
Calcium: 8 mg/dL — ABNORMAL LOW (ref 8.4–10.5)
Calcium: 8.2 mg/dL — ABNORMAL LOW (ref 8.4–10.5)
Calcium: 8.3 mg/dL — ABNORMAL LOW (ref 8.4–10.5)
Calcium: 8.3 mg/dL — ABNORMAL LOW (ref 8.4–10.5)
Calcium: 8.3 mg/dL — ABNORMAL LOW (ref 8.4–10.5)
Calcium: 8.3 mg/dL — ABNORMAL LOW (ref 8.4–10.5)
Calcium: 8.5 mg/dL (ref 8.4–10.5)
Calcium: 8.6 mg/dL (ref 8.4–10.5)
Chloride: 101 mEq/L (ref 96–112)
Chloride: 105 mEq/L (ref 96–112)
Chloride: 107 mEq/L (ref 96–112)
Chloride: 107 mEq/L (ref 96–112)
Chloride: 108 mEq/L (ref 96–112)
Chloride: 108 mEq/L (ref 96–112)
Chloride: 108 mEq/L (ref 96–112)
Chloride: 114 mEq/L — ABNORMAL HIGH (ref 96–112)
Creatinine, Ser: 0.92 mg/dL (ref 0.4–1.2)
Creatinine, Ser: 1.05 mg/dL (ref 0.4–1.2)
Creatinine, Ser: 1.12 mg/dL (ref 0.4–1.2)
Creatinine, Ser: 1.12 mg/dL (ref 0.4–1.2)
Creatinine, Ser: 1.27 mg/dL — ABNORMAL HIGH (ref 0.4–1.2)
Creatinine, Ser: 1.31 mg/dL — ABNORMAL HIGH (ref 0.4–1.2)
Creatinine, Ser: 1.32 mg/dL — ABNORMAL HIGH (ref 0.4–1.2)
Creatinine, Ser: 1.79 mg/dL — ABNORMAL HIGH (ref 0.4–1.2)
GFR calc Af Amer: 34 mL/min — ABNORMAL LOW (ref >60)
GFR calc Af Amer: 49 mL/min — ABNORMAL LOW (ref >60)
GFR calc Af Amer: 49 mL/min — ABNORMAL LOW (ref >60)
GFR calc Af Amer: 51 mL/min — ABNORMAL LOW (ref >60)
GFR calc Af Amer: 59 mL/min — ABNORMAL LOW (ref >60)
GFR calc Af Amer: 59 mL/min — ABNORMAL LOW (ref >60)
GFR calc Af Amer: >60 mL/min (ref >60)
GFR calc Af Amer: >60 mL/min (ref >60)
GFR calc non Af Amer: 28 mL/min — ABNORMAL LOW (ref >60)
GFR calc non Af Amer: 40 mL/min — ABNORMAL LOW (ref >60)
GFR calc non Af Amer: 41 mL/min — ABNORMAL LOW (ref >60)
GFR calc non Af Amer: 42 mL/min — ABNORMAL LOW (ref >60)
GFR calc non Af Amer: 49 mL/min — ABNORMAL LOW (ref >60)
GFR calc non Af Amer: 49 mL/min — ABNORMAL LOW (ref >60)
GFR calc non Af Amer: 53 mL/min — ABNORMAL LOW (ref >60)
GFR calc non Af Amer: >60 mL/min (ref >60)
Glucose, Bld: 126 mg/dL — ABNORMAL HIGH (ref 70–99)
Glucose, Bld: 141 mg/dL — ABNORMAL HIGH (ref 70–99)
Glucose, Bld: 146 mg/dL — ABNORMAL HIGH (ref 70–99)
Glucose, Bld: 156 mg/dL — ABNORMAL HIGH (ref 70–99)
Glucose, Bld: 174 mg/dL — ABNORMAL HIGH (ref 70–99)
Glucose, Bld: 63 mg/dL — ABNORMAL LOW (ref 70–99)
Glucose, Bld: 64 mg/dL — ABNORMAL LOW (ref 70–99)
Glucose, Bld: 79 mg/dL (ref 70–99)
Potassium: 3.5 mEq/L (ref 3.5–5.1)
Potassium: 3.5 mEq/L (ref 3.5–5.1)
Potassium: 3.8 mEq/L (ref 3.5–5.1)
Potassium: 3.8 mEq/L (ref 3.5–5.1)
Potassium: 3.9 mEq/L (ref 3.5–5.1)
Potassium: 4.1 mEq/L (ref 3.5–5.1)
Potassium: 4.3 mEq/L (ref 3.5–5.1)
Potassium: 4.5 mEq/L (ref 3.5–5.1)
Sodium: 133 mEq/L — ABNORMAL LOW (ref 135–145)
Sodium: 135 mEq/L (ref 135–145)
Sodium: 135 mEq/L (ref 135–145)
Sodium: 137 mEq/L (ref 135–145)
Sodium: 139 mEq/L (ref 135–145)
Sodium: 139 mEq/L (ref 135–145)
Sodium: 141 mEq/L (ref 135–145)
Sodium: 141 mEq/L (ref 135–145)

## 2010-10-12 LAB — POCT I-STAT, CHEM 8
BUN: 17 mg/dL (ref 6–23)
Calcium, Ion: 1.25 mmol/L (ref 1.12–1.32)
Chloride: 111 mEq/L (ref 96–112)
Creatinine, Ser: 0.8 mg/dL (ref 0.4–1.2)
Glucose, Bld: 196 mg/dL — ABNORMAL HIGH (ref 70–99)
HCT: 29 % — ABNORMAL LOW (ref 36.0–46.0)
Hemoglobin: 9.9 g/dL — ABNORMAL LOW (ref 12.0–15.0)
Potassium: 4.4 mEq/L (ref 3.5–5.1)
Sodium: 144 mEq/L (ref 135–145)
TCO2: 22 mmol/L (ref 0–100)

## 2010-10-12 LAB — GLUCOSE, CAPILLARY
Glucose-Capillary: 101 mg/dL — ABNORMAL HIGH (ref 70–99)
Glucose-Capillary: 102 mg/dL — ABNORMAL HIGH (ref 70–99)
Glucose-Capillary: 103 mg/dL — ABNORMAL HIGH (ref 70–99)
Glucose-Capillary: 108 mg/dL — ABNORMAL HIGH (ref 70–99)
Glucose-Capillary: 117 mg/dL — ABNORMAL HIGH (ref 70–99)
Glucose-Capillary: 119 mg/dL — ABNORMAL HIGH (ref 70–99)
Glucose-Capillary: 120 mg/dL — ABNORMAL HIGH (ref 70–99)
Glucose-Capillary: 122 mg/dL — ABNORMAL HIGH (ref 70–99)
Glucose-Capillary: 126 mg/dL — ABNORMAL HIGH (ref 70–99)
Glucose-Capillary: 132 mg/dL — ABNORMAL HIGH (ref 70–99)
Glucose-Capillary: 134 mg/dL — ABNORMAL HIGH (ref 70–99)
Glucose-Capillary: 136 mg/dL — ABNORMAL HIGH (ref 70–99)
Glucose-Capillary: 138 mg/dL — ABNORMAL HIGH (ref 70–99)
Glucose-Capillary: 139 mg/dL — ABNORMAL HIGH (ref 70–99)
Glucose-Capillary: 142 mg/dL — ABNORMAL HIGH (ref 70–99)
Glucose-Capillary: 142 mg/dL — ABNORMAL HIGH (ref 70–99)
Glucose-Capillary: 143 mg/dL — ABNORMAL HIGH (ref 70–99)
Glucose-Capillary: 144 mg/dL — ABNORMAL HIGH (ref 70–99)
Glucose-Capillary: 145 mg/dL — ABNORMAL HIGH (ref 70–99)
Glucose-Capillary: 146 mg/dL — ABNORMAL HIGH (ref 70–99)
Glucose-Capillary: 148 mg/dL — ABNORMAL HIGH (ref 70–99)
Glucose-Capillary: 154 mg/dL — ABNORMAL HIGH (ref 70–99)
Glucose-Capillary: 155 mg/dL — ABNORMAL HIGH (ref 70–99)
Glucose-Capillary: 161 mg/dL — ABNORMAL HIGH (ref 70–99)
Glucose-Capillary: 162 mg/dL — ABNORMAL HIGH (ref 70–99)
Glucose-Capillary: 162 mg/dL — ABNORMAL HIGH (ref 70–99)
Glucose-Capillary: 184 mg/dL — ABNORMAL HIGH (ref 70–99)
Glucose-Capillary: 187 mg/dL — ABNORMAL HIGH (ref 70–99)
Glucose-Capillary: 195 mg/dL — ABNORMAL HIGH (ref 70–99)
Glucose-Capillary: 200 mg/dL — ABNORMAL HIGH (ref 70–99)
Glucose-Capillary: 224 mg/dL — ABNORMAL HIGH (ref 70–99)
Glucose-Capillary: 241 mg/dL — ABNORMAL HIGH (ref 70–99)
Glucose-Capillary: 257 mg/dL — ABNORMAL HIGH (ref 70–99)
Glucose-Capillary: 59 mg/dL — ABNORMAL LOW (ref 70–99)
Glucose-Capillary: 61 mg/dL — ABNORMAL LOW (ref 70–99)
Glucose-Capillary: 62 mg/dL — ABNORMAL LOW (ref 70–99)
Glucose-Capillary: 67 mg/dL — ABNORMAL LOW (ref 70–99)
Glucose-Capillary: 69 mg/dL — ABNORMAL LOW (ref 70–99)
Glucose-Capillary: 74 mg/dL (ref 70–99)
Glucose-Capillary: 75 mg/dL (ref 70–99)
Glucose-Capillary: 77 mg/dL (ref 70–99)
Glucose-Capillary: 91 mg/dL (ref 70–99)
Glucose-Capillary: 91 mg/dL (ref 70–99)
Glucose-Capillary: 92 mg/dL (ref 70–99)
Glucose-Capillary: 94 mg/dL (ref 70–99)
Glucose-Capillary: 94 mg/dL (ref 70–99)
Glucose-Capillary: 97 mg/dL (ref 70–99)
Glucose-Capillary: 99 mg/dL (ref 70–99)

## 2010-10-12 LAB — CREATININE, SERUM
Creatinine, Ser: 0.72 mg/dL (ref 0.4–1.2)
Creatinine, Ser: 1.21 mg/dL — ABNORMAL HIGH (ref 0.4–1.2)
GFR calc Af Amer: 54 mL/min — ABNORMAL LOW (ref >60)
GFR calc Af Amer: >60 mL/min (ref >60)
GFR calc non Af Amer: 45 mL/min — ABNORMAL LOW (ref >60)
GFR calc non Af Amer: >60 mL/min (ref >60)

## 2010-10-12 LAB — POCT I-STAT GLUCOSE
Glucose, Bld: 132 mg/dL — ABNORMAL HIGH (ref 70–99)
Operator id: 3390

## 2010-10-12 LAB — BLOOD GAS, ARTERIAL
Acid-base deficit: 1.4 mmol/L (ref 0.0–2.0)
Bicarbonate: 23 mEq/L (ref 20.0–24.0)
Drawn by: 206361
FIO2: 0.21 %
O2 Saturation: 97.4 %
Patient temperature: 98.6
TCO2: 24.3 mmol/L (ref 0–100)
pCO2 arterial: 40.1 mmHg (ref 35.0–45.0)
pH, Arterial: 7.377 (ref 7.350–7.400)
pO2, Arterial: 92.2 mmHg (ref 80.0–100.0)

## 2010-10-12 LAB — COMPREHENSIVE METABOLIC PANEL
ALT: 10 U/L (ref 0–35)
AST: 17 U/L (ref 0–37)
Albumin: 3.4 g/dL — ABNORMAL LOW (ref 3.5–5.2)
Alkaline Phosphatase: 51 U/L (ref 39–117)
BUN: 19 mg/dL (ref 6–23)
CO2: 23 mEq/L (ref 19–32)
Calcium: 9.7 mg/dL (ref 8.4–10.5)
Chloride: 106 mEq/L (ref 96–112)
Creatinine, Ser: 0.82 mg/dL (ref 0.4–1.2)
GFR calc Af Amer: >60 mL/min (ref >60)
GFR calc non Af Amer: >60 mL/min (ref >60)
Glucose, Bld: 211 mg/dL — ABNORMAL HIGH (ref 70–99)
Potassium: 4.7 mEq/L (ref 3.5–5.1)
Sodium: 137 mEq/L (ref 135–145)
Total Bilirubin: 0.6 mg/dL (ref 0.3–1.2)
Total Protein: 7 g/dL (ref 6.0–8.3)

## 2010-10-12 LAB — HEMOGLOBIN A1C
Hgb A1c MFr Bld: 7.1 % — ABNORMAL HIGH (ref 4.6–6.1)
Mean Plasma Glucose: 157 mg/dL

## 2010-10-12 LAB — URINALYSIS, ROUTINE W REFLEX MICROSCOPIC
Bilirubin Urine: NEGATIVE
Glucose, UA: 250 mg/dL — AB
Hgb urine dipstick: NEGATIVE
Ketones, ur: NEGATIVE mg/dL
Nitrite: NEGATIVE
Protein, ur: NEGATIVE mg/dL
Specific Gravity, Urine: 1.019 (ref 1.005–1.030)
Urobilinogen, UA: 0.2 mg/dL (ref 0.0–1.0)
pH: 5 (ref 5.0–8.0)

## 2010-10-12 LAB — CK TOTAL AND CKMB (NOT AT ARMC)
CK, MB: 28.6 ng/mL — ABNORMAL HIGH (ref 0.3–4.0)
Relative Index: 6.6 — ABNORMAL HIGH (ref 0.0–2.5)
Total CK: 435 U/L — ABNORMAL HIGH (ref 7–177)

## 2010-10-12 LAB — APTT
aPTT: 29 seconds (ref 24–37)
aPTT: 30 seconds (ref 24–37)

## 2010-10-12 LAB — TYPE AND SCREEN
ABO/RH(D): O POS
Antibody Screen: NEGATIVE

## 2010-10-12 LAB — MAGNESIUM
Magnesium: 2 mg/dL (ref 1.5–2.5)
Magnesium: 2 mg/dL (ref 1.5–2.5)
Magnesium: 2.5 mg/dL (ref 1.5–2.5)

## 2010-10-12 LAB — PROTIME-INR
INR: 0.9 (ref 0.00–1.49)
INR: 1.1 (ref 0.00–1.49)
Prothrombin Time: 12 seconds (ref 11.6–15.2)
Prothrombin Time: 14.3 seconds (ref 11.6–15.2)

## 2010-10-12 LAB — MRSA PCR SCREENING: MRSA by PCR: NEGATIVE

## 2010-10-15 LAB — POCT I-STAT, CHEM 8
Creatinine, Ser: 1.2 mg/dL (ref 0.4–1.2)
HCT: 32 % — ABNORMAL LOW (ref 36.0–46.0)
Hemoglobin: 10.9 g/dL — ABNORMAL LOW (ref 12.0–15.0)
Potassium: 4.7 mEq/L (ref 3.5–5.1)
Sodium: 139 mEq/L (ref 135–145)

## 2010-10-17 LAB — COMPREHENSIVE METABOLIC PANEL
ALT: 16 U/L (ref 0–35)
ALT: 15 U/L (ref 0–35)
AST: 17 U/L (ref 0–37)
Albumin: 2.9 g/dL — ABNORMAL LOW (ref 3.5–5.2)
Alkaline Phosphatase: 53 U/L (ref 39–117)
Alkaline Phosphatase: 56 U/L (ref 39–117)
CO2: 30 mEq/L (ref 19–32)
Calcium: 9.4 mg/dL (ref 8.4–10.5)
Calcium: 10.1 mg/dL (ref 8.4–10.5)
Chloride: 103 mEq/L (ref 96–112)
GFR calc Af Amer: >60 mL/min (ref >60)
GFR calc Af Amer: >60 mL/min (ref >60)
GFR calc non Af Amer: 53 mL/min — ABNORMAL LOW (ref >60)
Glucose, Bld: 130 mg/dL — ABNORMAL HIGH (ref 70–99)
Potassium: 4.3 mEq/L (ref 3.5–5.1)
Potassium: 4.1 mEq/L (ref 3.5–5.1)
Sodium: 137 mEq/L (ref 135–145)
Sodium: 137 mEq/L (ref 135–145)
Total Protein: 7.8 g/dL (ref 6.0–8.3)

## 2010-10-17 LAB — GLUCOSE, CAPILLARY
Glucose-Capillary: 106 mg/dL — ABNORMAL HIGH (ref 70–99)
Glucose-Capillary: 109 mg/dL — ABNORMAL HIGH (ref 70–99)
Glucose-Capillary: 111 mg/dL — ABNORMAL HIGH (ref 70–99)
Glucose-Capillary: 115 mg/dL — ABNORMAL HIGH (ref 70–99)
Glucose-Capillary: 117 mg/dL — ABNORMAL HIGH (ref 70–99)
Glucose-Capillary: 120 mg/dL — ABNORMAL HIGH (ref 70–99)
Glucose-Capillary: 127 mg/dL — ABNORMAL HIGH (ref 70–99)
Glucose-Capillary: 128 mg/dL — ABNORMAL HIGH (ref 70–99)
Glucose-Capillary: 130 mg/dL — ABNORMAL HIGH (ref 70–99)
Glucose-Capillary: 141 mg/dL — ABNORMAL HIGH (ref 70–99)
Glucose-Capillary: 144 mg/dL — ABNORMAL HIGH (ref 70–99)
Glucose-Capillary: 146 mg/dL — ABNORMAL HIGH (ref 70–99)
Glucose-Capillary: 148 mg/dL — ABNORMAL HIGH (ref 70–99)
Glucose-Capillary: 149 mg/dL — ABNORMAL HIGH (ref 70–99)
Glucose-Capillary: 150 mg/dL — ABNORMAL HIGH (ref 70–99)
Glucose-Capillary: 150 mg/dL — ABNORMAL HIGH (ref 70–99)
Glucose-Capillary: 155 mg/dL — ABNORMAL HIGH (ref 70–99)
Glucose-Capillary: 169 mg/dL — ABNORMAL HIGH (ref 70–99)
Glucose-Capillary: 175 mg/dL — ABNORMAL HIGH (ref 70–99)
Glucose-Capillary: 175 mg/dL — ABNORMAL HIGH (ref 70–99)
Glucose-Capillary: 187 mg/dL — ABNORMAL HIGH (ref 70–99)
Glucose-Capillary: 267 mg/dL — ABNORMAL HIGH (ref 70–99)
Glucose-Capillary: 65 mg/dL — ABNORMAL LOW (ref 70–99)
Glucose-Capillary: 81 mg/dL (ref 70–99)
Glucose-Capillary: 82 mg/dL (ref 70–99)
Glucose-Capillary: 91 mg/dL (ref 70–99)
Glucose-Capillary: 98 mg/dL (ref 70–99)
Glucose-Capillary: 99 mg/dL (ref 70–99)
Glucose-Capillary: 108 mg/dL — ABNORMAL HIGH (ref 70–99)
Glucose-Capillary: 108 mg/dL — ABNORMAL HIGH (ref 70–99)
Glucose-Capillary: 111 mg/dL — ABNORMAL HIGH (ref 70–99)
Glucose-Capillary: 112 mg/dL — ABNORMAL HIGH (ref 70–99)
Glucose-Capillary: 114 mg/dL — ABNORMAL HIGH (ref 70–99)
Glucose-Capillary: 118 mg/dL — ABNORMAL HIGH (ref 70–99)
Glucose-Capillary: 123 mg/dL — ABNORMAL HIGH (ref 70–99)
Glucose-Capillary: 124 mg/dL — ABNORMAL HIGH (ref 70–99)
Glucose-Capillary: 126 mg/dL — ABNORMAL HIGH (ref 70–99)
Glucose-Capillary: 126 mg/dL — ABNORMAL HIGH (ref 70–99)
Glucose-Capillary: 126 mg/dL — ABNORMAL HIGH (ref 70–99)
Glucose-Capillary: 144 mg/dL — ABNORMAL HIGH (ref 70–99)
Glucose-Capillary: 147 mg/dL — ABNORMAL HIGH (ref 70–99)
Glucose-Capillary: 151 mg/dL — ABNORMAL HIGH (ref 70–99)
Glucose-Capillary: 155 mg/dL — ABNORMAL HIGH (ref 70–99)
Glucose-Capillary: 157 mg/dL — ABNORMAL HIGH (ref 70–99)
Glucose-Capillary: 160 mg/dL — ABNORMAL HIGH (ref 70–99)
Glucose-Capillary: 163 mg/dL — ABNORMAL HIGH (ref 70–99)
Glucose-Capillary: 168 mg/dL — ABNORMAL HIGH (ref 70–99)
Glucose-Capillary: 177 mg/dL — ABNORMAL HIGH (ref 70–99)
Glucose-Capillary: 181 mg/dL — ABNORMAL HIGH (ref 70–99)
Glucose-Capillary: 182 mg/dL — ABNORMAL HIGH (ref 70–99)
Glucose-Capillary: 192 mg/dL — ABNORMAL HIGH (ref 70–99)
Glucose-Capillary: 194 mg/dL — ABNORMAL HIGH (ref 70–99)
Glucose-Capillary: 194 mg/dL — ABNORMAL HIGH (ref 70–99)
Glucose-Capillary: 201 mg/dL — ABNORMAL HIGH (ref 70–99)
Glucose-Capillary: 202 mg/dL — ABNORMAL HIGH (ref 70–99)
Glucose-Capillary: 204 mg/dL — ABNORMAL HIGH (ref 70–99)
Glucose-Capillary: 205 mg/dL — ABNORMAL HIGH (ref 70–99)
Glucose-Capillary: 212 mg/dL — ABNORMAL HIGH (ref 70–99)
Glucose-Capillary: 220 mg/dL — ABNORMAL HIGH (ref 70–99)
Glucose-Capillary: 226 mg/dL — ABNORMAL HIGH (ref 70–99)
Glucose-Capillary: 229 mg/dL — ABNORMAL HIGH (ref 70–99)
Glucose-Capillary: 246 mg/dL — ABNORMAL HIGH (ref 70–99)
Glucose-Capillary: 75 mg/dL (ref 70–99)
Glucose-Capillary: 79 mg/dL (ref 70–99)
Glucose-Capillary: 96 mg/dL (ref 70–99)
Glucose-Capillary: 97 mg/dL (ref 70–99)

## 2010-10-17 LAB — POCT I-STAT 4, (NA,K, GLUC, HGB,HCT)
Glucose, Bld: 106 mg/dL — ABNORMAL HIGH (ref 70–99)
Glucose, Bld: 85 mg/dL (ref 70–99)
HCT: 28 % — ABNORMAL LOW (ref 36.0–46.0)
HCT: 31 % — ABNORMAL LOW (ref 36.0–46.0)
HCT: 32 % — ABNORMAL LOW (ref 36.0–46.0)
Hemoglobin: 10.5 g/dL — ABNORMAL LOW (ref 12.0–15.0)
Hemoglobin: 9.5 g/dL — ABNORMAL LOW (ref 12.0–15.0)
Potassium: 3.7 mEq/L (ref 3.5–5.1)
Potassium: 3.8 mEq/L (ref 3.5–5.1)
Sodium: 139 mEq/L (ref 135–145)
Sodium: 141 mEq/L (ref 135–145)

## 2010-10-17 LAB — DIFFERENTIAL
Basophils Relative: 0 % (ref 0–1)
Eosinophils Absolute: 0.3 10*3/uL (ref 0.0–0.7)
Eosinophils Relative: 4 % (ref 0–5)
Lymphs Abs: 2.2 10*3/uL (ref 0.7–4.0)
Neutrophils Relative %: 60 % (ref 43–77)

## 2010-10-17 LAB — BASIC METABOLIC PANEL
BUN: 11 mg/dL (ref 6–23)
BUN: 16 mg/dL (ref 6–23)
BUN: 21 mg/dL (ref 6–23)
CO2: 23 mEq/L (ref 19–32)
CO2: 24 mEq/L (ref 19–32)
CO2: 25 mEq/L (ref 19–32)
CO2: 26 mEq/L (ref 19–32)
CO2: 27 mEq/L (ref 19–32)
Calcium: 8.5 mg/dL (ref 8.4–10.5)
Calcium: 8.6 mg/dL (ref 8.4–10.5)
Calcium: 8.7 mg/dL (ref 8.4–10.5)
Calcium: 9.1 mg/dL (ref 8.4–10.5)
Chloride: 103 mEq/L (ref 96–112)
Chloride: 103 mEq/L (ref 96–112)
Chloride: 104 mEq/L (ref 96–112)
Creatinine, Ser: 0.79 mg/dL (ref 0.4–1.2)
Creatinine, Ser: 0.9 mg/dL (ref 0.4–1.2)
Creatinine, Ser: 0.91 mg/dL (ref 0.4–1.2)
Creatinine, Ser: 0.98 mg/dL (ref 0.4–1.2)
GFR calc Af Amer: 58 mL/min — ABNORMAL LOW (ref >60)
GFR calc Af Amer: >60 mL/min (ref >60)
GFR calc Af Amer: >60 mL/min (ref >60)
GFR calc Af Amer: >60 mL/min (ref >60)
GFR calc Af Amer: >60 mL/min (ref >60)
GFR calc non Af Amer: 57 mL/min — ABNORMAL LOW (ref >60)
GFR calc non Af Amer: >60 mL/min (ref >60)
Glucose, Bld: 165 mg/dL — ABNORMAL HIGH (ref 70–99)
Glucose, Bld: 190 mg/dL — ABNORMAL HIGH (ref 70–99)
Glucose, Bld: 232 mg/dL — ABNORMAL HIGH (ref 70–99)
Potassium: 3.8 mEq/L (ref 3.5–5.1)
Sodium: 137 mEq/L (ref 135–145)
Sodium: 138 mEq/L (ref 135–145)

## 2010-10-17 LAB — CBC
HCT: 32 % — ABNORMAL LOW (ref 36.0–46.0)
HCT: 33.4 % — ABNORMAL LOW (ref 36.0–46.0)
HCT: 33.7 % — ABNORMAL LOW (ref 36.0–46.0)
Hemoglobin: 11 g/dL — ABNORMAL LOW (ref 12.0–15.0)
Hemoglobin: 10.8 g/dL — ABNORMAL LOW (ref 12.0–15.0)
Hemoglobin: 11.4 g/dL — ABNORMAL LOW (ref 12.0–15.0)
MCHC: 33.8 g/dL (ref 30.0–36.0)
MCHC: 32.9 g/dL (ref 30.0–36.0)
MCHC: 33.3 g/dL (ref 30.0–36.0)
MCHC: 33.5 g/dL (ref 30.0–36.0)
MCHC: 33.8 g/dL (ref 30.0–36.0)
MCHC: 34.1 g/dL (ref 30.0–36.0)
MCHC: 34.1 g/dL (ref 30.0–36.0)
MCV: 80.2 fL (ref 78.0–100.0)
MCV: 81.7 fL (ref 78.0–100.0)
MCV: 82 fL (ref 78.0–100.0)
MCV: 83 fL (ref 78.0–100.0)
Platelets: 224 10*3/uL (ref 150–400)
Platelets: 225 10*3/uL (ref 150–400)
Platelets: 262 10*3/uL (ref 150–400)
Platelets: 333 10*3/uL (ref 150–400)
RBC: 3.97 MIL/uL (ref 3.87–5.11)
RBC: 3.86 MIL/uL — ABNORMAL LOW (ref 3.87–5.11)
RBC: 3.93 MIL/uL (ref 3.87–5.11)
RBC: 3.95 MIL/uL (ref 3.87–5.11)
RBC: 3.99 MIL/uL (ref 3.87–5.11)
RBC: 3.99 MIL/uL (ref 3.87–5.11)
RBC: 4.05 MIL/uL (ref 3.87–5.11)
RBC: 4.15 MIL/uL (ref 3.87–5.11)
RDW: 13.3 % (ref 11.5–15.5)
RDW: 13.7 % (ref 11.5–15.5)
RDW: 14 % (ref 11.5–15.5)
RDW: 14.2 % (ref 11.5–15.5)
WBC: 8.3 10*3/uL (ref 4.0–10.5)
WBC: 21.1 10*3/uL — ABNORMAL HIGH (ref 4.0–10.5)
WBC: 9.2 10*3/uL (ref 4.0–10.5)
WBC: 9.3 10*3/uL (ref 4.0–10.5)
WBC: 9.6 10*3/uL (ref 4.0–10.5)

## 2010-10-17 LAB — URINE CULTURE
Colony Count: >=100000
Colony Count: NO GROWTH
Culture: NO GROWTH

## 2010-10-17 LAB — URINALYSIS, ROUTINE W REFLEX MICROSCOPIC
Bilirubin Urine: NEGATIVE
Ketones, ur: NEGATIVE mg/dL
Ketones, ur: NEGATIVE mg/dL
Leukocytes, UA: NEGATIVE
Nitrite: NEGATIVE
Nitrite: NEGATIVE
Protein, ur: 30 mg/dL — AB
Protein, ur: 100 mg/dL — AB
Specific Gravity, Urine: 1.017 (ref 1.005–1.030)
Urobilinogen, UA: 0.2 mg/dL (ref 0.0–1.0)
pH: 6 (ref 5.0–8.0)

## 2010-10-17 LAB — HEPARIN LEVEL (UNFRACTIONATED)
Heparin Unfractionated: 0.15 IU/mL — ABNORMAL LOW (ref 0.30–0.70)
Heparin Unfractionated: 0.21 IU/mL — ABNORMAL LOW (ref 0.30–0.70)
Heparin Unfractionated: 0.24 IU/mL — ABNORMAL LOW (ref 0.30–0.70)
Heparin Unfractionated: 0.45 IU/mL (ref 0.30–0.70)
Heparin Unfractionated: 1.23 IU/mL — ABNORMAL HIGH (ref 0.30–0.70)

## 2010-10-17 LAB — CROSSMATCH
ABO/RH(D): O POS
Antibody Screen: NEGATIVE

## 2010-10-17 LAB — LIPID PANEL
Cholesterol: 140 mg/dL (ref 0–200)
HDL: 48 mg/dL (ref >39)

## 2010-10-17 LAB — PROTIME-INR: Prothrombin Time: 13.4 seconds (ref 11.6–15.2)

## 2010-10-17 LAB — SEDIMENTATION RATE: Sed Rate: 117 mm/hr — ABNORMAL HIGH (ref 0–22)

## 2010-10-17 LAB — FLUORESCENT TREPONEMAL AB(FTA)-IGG-BLD: Fluorescent Treponemal Ab, IgG: NONREACTIVE

## 2010-10-17 LAB — RPR: RPR Ser Ql: NONREACTIVE

## 2010-10-17 LAB — ABO/RH: ABO/RH(D): O POS

## 2010-10-17 LAB — URINE MICROSCOPIC-ADD ON

## 2010-10-18 LAB — BASIC METABOLIC PANEL
CO2: 25 mEq/L (ref 19–32)
Chloride: 106 mEq/L (ref 96–112)
GFR calc Af Amer: 50 mL/min — ABNORMAL LOW (ref >60)
Potassium: 3.8 mEq/L (ref 3.5–5.1)
Sodium: 138 mEq/L (ref 135–145)

## 2010-10-18 LAB — CBC
HCT: 31.4 % — ABNORMAL LOW (ref 36.0–46.0)
HCT: 40.4 % (ref 36.0–46.0)
Hemoglobin: 10.6 g/dL — ABNORMAL LOW (ref 12.0–15.0)
Hemoglobin: 11.5 g/dL — ABNORMAL LOW (ref 12.0–15.0)
MCHC: 32.9 g/dL (ref 30.0–36.0)
MCHC: 32.9 g/dL (ref 30.0–36.0)
MCV: 81.4 fL (ref 78.0–100.0)
MCV: 81.5 fL (ref 78.0–100.0)
MCV: 81.8 fL (ref 78.0–100.0)
Platelets: 224 10*3/uL (ref 150–400)
RBC: 3.95 MIL/uL (ref 3.87–5.11)
RBC: 4.27 MIL/uL (ref 3.87–5.11)
RBC: 4.96 MIL/uL (ref 3.87–5.11)
RDW: 13.7 % (ref 11.5–15.5)
WBC: 10.3 10*3/uL (ref 4.0–10.5)
WBC: 11 10*3/uL — ABNORMAL HIGH (ref 4.0–10.5)
WBC: 8.1 10*3/uL (ref 4.0–10.5)
WBC: 9 10*3/uL (ref 4.0–10.5)

## 2010-10-18 LAB — URINE MICROSCOPIC-ADD ON

## 2010-10-18 LAB — CK TOTAL AND CKMB (NOT AT ARMC)
CK, MB: 0.8 ng/mL (ref 0.3–4.0)
Total CK: 50 U/L (ref 7–177)

## 2010-10-18 LAB — POCT I-STAT, CHEM 8
Chloride: 104 mEq/L (ref 96–112)
Glucose, Bld: 185 mg/dL — ABNORMAL HIGH (ref 70–99)
HCT: 44 % (ref 36.0–46.0)
Hemoglobin: 15 g/dL (ref 12.0–15.0)
Potassium: 4.4 mEq/L (ref 3.5–5.1)
Sodium: 137 mEq/L (ref 135–145)

## 2010-10-18 LAB — GLUCOSE, CAPILLARY
Glucose-Capillary: 237 mg/dL — ABNORMAL HIGH (ref 70–99)
Glucose-Capillary: 238 mg/dL — ABNORMAL HIGH (ref 70–99)
Glucose-Capillary: 288 mg/dL — ABNORMAL HIGH (ref 70–99)
Glucose-Capillary: 293 mg/dL — ABNORMAL HIGH (ref 70–99)
Glucose-Capillary: 351 mg/dL — ABNORMAL HIGH (ref 70–99)

## 2010-10-18 LAB — CARDIAC PANEL(CRET KIN+CKTOT+MB+TROPI)
Relative Index: 6.3 — ABNORMAL HIGH (ref 0.0–2.5)
Total CK: 232 U/L — ABNORMAL HIGH (ref 7–177)
Troponin I: 3.13 ng/mL (ref 0.00–0.06)
Troponin I: 3.22 ng/mL (ref 0.00–0.06)

## 2010-10-18 LAB — URINALYSIS, ROUTINE W REFLEX MICROSCOPIC
Bilirubin Urine: NEGATIVE
Glucose, UA: >1000 mg/dL — AB
Hgb urine dipstick: NEGATIVE
Ketones, ur: NEGATIVE mg/dL
Protein, ur: >300 mg/dL — AB
Urobilinogen, UA: 0.2 mg/dL (ref 0.0–1.0)

## 2010-10-18 LAB — DIFFERENTIAL
Basophils Relative: 0 % (ref 0–1)
Eosinophils Absolute: 0 10*3/uL (ref 0.0–0.7)
Eosinophils Relative: 0 % (ref 0–5)
Lymphs Abs: 0.8 10*3/uL (ref 0.7–4.0)
Monocytes Absolute: 0.2 10*3/uL (ref 0.1–1.0)
Monocytes Relative: 2 % — ABNORMAL LOW (ref 3–12)
Neutrophils Relative %: 91 % — ABNORMAL HIGH (ref 43–77)

## 2010-10-18 LAB — POCT CARDIAC MARKERS
CKMB, poc: 1.3 ng/mL (ref 1.0–8.0)
CKMB, poc: 1.5 ng/mL (ref 1.0–8.0)
Myoglobin, poc: 299 ng/mL (ref 12–200)
Troponin i, poc: <0.05 ng/mL (ref 0.00–0.09)

## 2010-10-18 LAB — UIFE/LIGHT CHAINS/TP QN, 24-HR UR
Albumin, U: DETECTED
Alpha 1, Urine: DETECTED — AB
Alpha 2, Urine: DETECTED — AB
Free Kappa Lt Chains,Ur: 6.62 mg/dL — ABNORMAL HIGH (ref 0.04–1.51)
Free Kappa/Lambda Ratio: 8.28 ratio — ABNORMAL HIGH (ref 0.46–4.00)
Volume, Urine: 1500 mL

## 2010-10-18 LAB — HEPARIN LEVEL (UNFRACTIONATED)
Heparin Unfractionated: 0.29 IU/mL — ABNORMAL LOW (ref 0.30–0.70)
Heparin Unfractionated: <0.1 IU/mL — ABNORMAL LOW (ref 0.30–0.70)
Heparin Unfractionated: <0.1 IU/mL — ABNORMAL LOW (ref 0.30–0.70)

## 2010-10-18 LAB — TROPONIN I: Troponin I: 0.01 ng/mL (ref 0.00–0.06)

## 2010-10-18 LAB — PROTIME-INR
INR: 1.1 (ref 0.00–1.49)
Prothrombin Time: 14.4 seconds (ref 11.6–15.2)

## 2010-10-18 LAB — APTT: aPTT: 46 seconds — ABNORMAL HIGH (ref 24–37)

## 2010-10-18 LAB — HEMOGLOBIN A1C
Hgb A1c MFr Bld: 9.5 % — ABNORMAL HIGH (ref 4.6–6.1)
Mean Plasma Glucose: 226 mg/dL

## 2010-10-21 ENCOUNTER — Inpatient Hospital Stay (INDEPENDENT_AMBULATORY_CARE_PROVIDER_SITE_OTHER)
Admission: RE | Admit: 2010-10-21 | Discharge: 2010-10-21 | Disposition: A | Discharge status: Home / Self Care | Payer: Medicare Other | Source: Ambulatory Visit | Attending: Family Medicine | Admitting: Family Medicine

## 2010-10-21 DIAGNOSIS — J019 Acute sinusitis, unspecified: Secondary | ICD-10-CM

## 2010-10-21 DIAGNOSIS — I10 Essential (primary) hypertension: Secondary | ICD-10-CM

## 2010-10-22 ENCOUNTER — Telehealth: Payer: Self-pay | Admitting: *Deleted

## 2010-10-22 NOTE — Telephone Encounter (Signed)
Pt left vm concerned about elevated bp - I spoke w/pt. She has elevated BP when she goes to MD's office (was at Dr Rosalyn Charters recently) after a couple min it goes back to normal. Pt checks BP at home and states it is "Normal". I advised her to keep list of readings and call office w/any concerns - also advised if BP is 140/90 or above to notify office.

## 2010-10-22 NOTE — Assessment & Plan Note (Signed)
Has been asymptomatic since the time of her bypass.  No further cardiac workup is indicated at present. Therefore, future followup with  Dr. Jonny Ruiz in the absence of more symptoms.

## 2010-10-22 NOTE — Assessment & Plan Note (Signed)
She is currently managed by Dr. Jonny Ruiz. BP are elevated, and it is probably labile, but she needs follow up for optimal management.  Will defer to her primary MD.

## 2010-10-22 NOTE — Progress Notes (Signed)
HPI:  Veronica Copeland is a delightful lady seen in follow up.  She has seen Dr. Tyrone Sage recently who felt she likely did not need further follow up.  Her cardiac status remains stable at the present time.  She denies chest pain, shortness of breath, and progressive symptoms, or radiation.  She remains wheel chair bound, and has remained in a slowly progressive rehab situation over time.   Current Outpatient Prescriptions  Medication Sig Dispense Refill  . aspirin 81 MG tablet Take 81 mg by mouth daily.        . citalopram (CELEXA) 10 MG tablet Take 10 mg by mouth daily.        . cyanocobalamin (,VITAMIN B-12,) 1000 MCG/ML injection Inject 1,000 mcg into the muscle every 30 (thirty) days.        . fexofenadine (ALLEGRA) 180 MG tablet Take 180 mg by mouth daily.        Marland Kitchen glimepiride (AMARYL) 1 MG tablet Take 0.5 mg by mouth daily before breakfast.        . glucose blood (ONE TOUCH TEST STRIPS) test strip Use as instructed       . insulin aspart (NOVOLOG) 100 UNIT/ML injection as directed.        . nitroGLYCERIN (NITROSTAT) 0.4 MG SL tablet Place 0.4 mg under the tongue every 5 (five) minutes as needed.        . pravastatin (PRAVACHOL) 10 MG tablet Take 10 mg by mouth daily.        . SYRINGE-NEEDLE, DISP, 3 ML (TERUMO SURGUARD2 SYRINGE) 25G X 1" 3 ML MISC as directed.        . traMADol (ULTRAM) 50 MG tablet Take 50 mg by mouth every 6 (six) hours as needed.        . cyanocobalamin 1000 MCG tablet Take 100 mcg by mouth daily.        . ferrous fumarate-iron polysaccharide complex (TANDEM) 162-115.2 MG CAPS Take 1 capsule by mouth daily with breakfast.        . folic acid (FOLVITE) 1 MG tablet Take 1 mg by mouth daily.        . lansoprazole (PREVACID) 30 MG capsule Take 30 mg by mouth daily.        . metFORMIN (GLUMETZA) 500 MG (MOD) 24 hr tablet Take 1,000 mg by mouth 2 (two) times daily with a meal.          Allergies  Allergen Reactions  . Penicillins     Past Medical History  Diagnosis Date  .  CAD (coronary artery disease)     s/p CABG  . HLD (hyperlipidemia)   . HTN (hypertension)   . Acute MI   . Lung nodule   . DM type 1 (diabetes mellitus, type 1)   . Back pain   . Anemia   . Allergic rhinitis   . Osteopenia   . Vaginitis   . Ganglion of joint   . Depression   . DVT (deep venous thrombosis)   . Paraplegia   . Anemia   . GERD (gastroesophageal reflux disease)     Past Surgical History  Procedure Date  . Rotator cuff repair   . Cesarean section   . Coronary artery bypass graft 03/2009  . Laminectomy 2010    Family History  Problem Relation Age of Onset  . Diabetes    . Hypertension    . Stroke    . Coronary artery disease      History  Social History  . Marital Status: Divorced    Spouse Name: N/A    Number of Children: N/A  . Years of Education: N/A   Occupational History  . Not on file.   Social History Main Topics  . Smoking status: Never Smoker   . Smokeless tobacco: Not on file  . Alcohol Use: No  . Drug Use: No  . Sexually Active: Not on file   Other Topics Concern  . Not on file   Social History Narrative  . No narrative on file    ROS: Please see the HPI.  All other systems reviewed and negative.  PHYSICAL EXAM:  BP 200/86  Pulse 95  Resp 18  Ht 5\' 3"  (1.6 m)  Wt 116 lb (52.617 kg)  BMI 20.55 kg/m2  General: Thin, chronically ill appearing woman in no distress. Head:  Normocephalic and atraumatic. Neck: no JVD Lungs: Clear to auscultation and percussion.  Minimal decrease in breath sounds.  Heart: Normal S1 and S2.  No murmur, rubs or gallops.  Pulses: Pulses normal in all 4 extremities. Extremities: No clubbing or cyanosis. No edema. Neurologic: Alert and oriented x 3.  Sitting in wheel chair at present time.   EKG:  NSR.  Lowvoltage QRS.  Non specific T abnormality.  Cannot exclude old IMI.   ASSESSMENT AND PLAN:

## 2010-10-22 NOTE — Assessment & Plan Note (Signed)
Managed by Dr. John 

## 2010-10-22 NOTE — Telephone Encounter (Signed)
I agree

## 2010-10-29 ENCOUNTER — Telehealth: Payer: Self-pay

## 2010-10-29 MED ORDER — PRAVASTATIN SODIUM 10 MG PO TABS: 10.0000 mg | ORAL_TABLET | Freq: Every day | ORAL | Status: DC

## 2010-10-29 MED ORDER — TRAMADOL HCL 50 MG PO TABS: 50.0000 mg | ORAL_TABLET | Freq: Four times a day (QID) | ORAL | Status: DC | PRN

## 2010-10-29 NOTE — Telephone Encounter (Signed)
Done per emr 

## 2010-10-29 NOTE — Telephone Encounter (Signed)
Addended by: Oliver Barre on: 10/29/2010 12:20 PM   Modules accepted: Orders

## 2010-10-29 NOTE — Telephone Encounter (Signed)
Pharmacy requesting refill on Tramadol 50 mg last refill 08/23/10 #240 with 0 refills and last OV 2/12.

## 2010-11-20 NOTE — Consult Note (Signed)
[name], [name]:  [id number]   MEDICAL RECORD NO.:  [id number]          PATIENT TYPE:  INP   LOCATION:  2022                         FACILITY:  [facility]   PHYSICIAN:  Melvyn Novas, M.D.  DATE OF BIRTH:  02-25-1946   DATE OF CONSULTATION:  10/06/2008  DATE OF DISCHARGE:                                 CONSULTATION   ADDENDUM:  I am dictating an addendum for this patient , [name]-  neurology was  consulted for lower extremity weakness.   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  This would also fit the patient's progression history that she gave and  the unrelatedness of her symptoms to the previously obtained cardiac  catheterization study.  The patient still has normal lower extremity  pulses.  She has strong popliteal pulses.  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  We were unable to detect this and we evaluated her  on October 06, 2008.  The patient review of system never included urinary or stool  incontinence.  After the results of the imaging studies returned, now an  MRI of the thoracic spine with contrast has been added.  A neurosurgical evaluation was requested and an oncology consult is  pending.   We contacted the Neurosurgery office for this today on October 11, 2008.   I thank Dr. Riley Kill for the consultation.  Unfortunately, this tumor is  likely to require a surgical intervention and conservative follow up.  May include steroid treatments if the neurosurgical colleagues will deem  this appropriate. Neurology will sign off.      Melvyn Novas, M.D.  Electronically Signed     CD/MEDQ  D:  10/11/2008  T:  10/12/2008  Job:  [id number]

## 2010-11-20 NOTE — Consult Note (Signed)
Veronica Copeland, Veronica Copeland NO.:  [id number]   MEDICAL RECORD NO.:  [id number]          PATIENT TYPE:  INP   LOCATION:  2923                         FACILITY:  [facility]   PHYSICIAN:  Sheliah Plane, MD    DATE OF BIRTH:  09/09/1945   DATE OF CONSULTATION:  10/03/2008  DATE OF DISCHARGE:                                 CONSULTATION   REQUESTING PHYSICIAN:  Veverly Fells. Excell Seltzer, MD   FOLLOWUP CARDIOLOGIST:  Arturo Morton. Riley Kill, MD, [facility]   PRIMARY CARE PHYSICIAN:  Penni Bombard, MD   REASON FOR CONSULTATION:  Coronary artery disease with acute myocardial  infarction.   HISTORY OF PRESENT ILLNESS:  The patient is a 65 year old diabetic  female with known coronary occlusive disease having been cathed in 2000  and told that she had significant distal coronary disease and has been  treated medically since.  She noted that for the past year she had been  having increasing episodes of shortness of breath, diaphoresis, right  arm and neck pain when she walks into her building to work.  This became  more acute this past weekend.  On Saturday, she had some pain and on  Sunday noted right arm pain, right neck pain, and also right leg pain  and weakness and was actually seen in the emergency room for lower back  pain and claudication.  Her CK-MBs were elevated with CK of 232 and MB  of 14.5 and a peak troponin of 3.13.  She was admitted for  subendocardial myocardial infarction.  She has had no previous stents or  cardiac surgery performed.  She does have hypertension, hyperlipidemia,  type 2 diabetes for many years, hemoglobin A1c recently is 9.5.  Denies  cigarette use.  Has a positive family history for coronary disease.  Mother died of myocardial infarction at age 35.  Father died of prostate  cancer at age 35.  She has had no previous stroke.  Denies claudication.  Denies renal insufficiency.   PREVIOUS SURGERY:  Right shoulder surgery.   SOCIAL HISTORY:  The patient is divorced  and lives alone.  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   CURRENT MEDICATIONS:  1. Metformin 500 mg twice a day.  2. Cyclobenzaprine 5 mg 3 times a day.  3. Norvasc 5 mg a day.  4. Metoprolol XL 50 mg a day.  5. Aspirin 81 mg a day.  6. Pravastatin 10 mg a day.  7. Imdur 60 mg a day.   In addition, she was loaded with Plavix this morning.   ALLERGIES:  PENICILLIN causes yeast infections.   CARDIAC REVIEW OF SYSTEMS:  Positive for chest pain, dyspnea on exertion  with shortness of breath.  Denies resting shortness of breath.  Denies  lower extremity edema.  Denies palpitations, syncope, presyncope, or  orthopnea.  She denies any fevers, chills, or night sweats.  Denies  hemoptysis.  Denies change in bowel habits.  Neurologic, notes that she  has had right arm and right leg weakness.  Note, amaurosis.  No visual  changes.  It is unclear of the  etiology of her right leg pain.  She  notes oral hygiene; diabetic.  Denies polyuria, polydipsia.  Other  review of systems are negative.   PHYSICAL EXAMINATION:  VITAL SIGNS:  Blood pressure is 131/72, pulse is  87 and regular sinus, respiratory rate is 18.  GENERAL:  The patient is awake and alert, neurologically intact, appears  older than her stated age at 65 years.  She is able to relate her  history with good detail.  NECK:  I do not appreciate any carotid bruits.  LUNGS:  Clear bilaterally.  CARDIAC:  Regular rate and rhythm without murmur or gallop.  ABDOMEN:  Benign.  She was cathed in the left groin.  There is a  dressing in place.  Right groin is without hematoma.  She has palpable  femoral pulses bilaterally.  Palpable DP pulses bilaterally.  Posterior  tibial pulses bilaterally.  Faint absent.   LABORATORY FINDINGS:  Glucose is running between 293 and 349.  She has  protein in her urine.  Hemoglobin A1c 9.5, BNP 54, hematocrit is 34.3,  white count 10.3.  PT 14.4, INR 1.1, and PTT is 46.   Cardiac  catheterization films reviewed with Dr. Excell Seltzer.  Films from 2000  were not available.  Cardiac catheterization revealed preserved LV  function.  She has diffuse disease throughout her coronary system.  The  proximal circumflex is without significant disease, but distal  circumflex is a very small vessel.  The right coronary artery has  proximal, mid, and distal disease.  The LAD in the mid portion has a  high-grade stenosis with significant calcifications to the mid vessels.  The vessel is moderately large and circumferential around the apex of  the heart.  The diagonal is small and has 80% stenosis.   IMPRESSION:  1. The patient has severe diabetic coronary artery disease, but does      have significant stenosis in the left anterior descending possibly      diagonal and possibly distal right coronary artery that could be      palpated.  She came in with subendocardial myocardial infarction      and has been having ongoing angina prior to that.  2. 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  3. Unclear symptoms involving right leg pain.  We will check carotid      Doppler studies to rule out any carotid disease.   After reviewing the films with Dr. Excell Seltzer, I agreed that, although not  ideal distal vessels, the patient would best be served since she is  diabetic with coronary artery bypass grafting at least to place mammary  to the LAD, possible diagonal and possible distal right vessels for  preservation of LV function and relief of exertion anginal symptoms.  She does need close followup and medical consultation for her diabetic  nephropathy and further evaluation of this.  In addition, better diabetic control with elevated hemoglobin A1c and in-  hospital glucose is between 250 and 350.  Because of the loading of  Plavix that she received this morning, I will tentatively plan surgery  for early next week on October 10, 2008, or October 11, 2008.       Sheliah Plane, MD  Electronically Signed     EG/MEDQ  D:  10/03/2008  T:  10/04/2008  Job:  [id number]   cc:   Penni Bombard, MD  Arturo Morton. Riley Kill, MD, [facility]

## 2010-11-20 NOTE — Op Note (Signed)
NAMEJOHNANNA, BAKKE NO.:  [id number]   MEDICAL RECORD NO.:  [id number]          PATIENT TYPE:  INP   LOCATION:  2022                         FACILITY:  [facility]   PHYSICIAN:  Hilda Lias, M.D.   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   PROCEDURES:  1. Bilateral T12-L1 laminectomy.  2. Biopsy of intradural mass, microscope.   SURGEON:  Hilda Lias, MD   ASSISTANT:  Coletta Memos, MD.   CLINICAL HISTORY:  The patient was admitted because of MI.  During the  physical examination, it was found that she has severe weakness of the  left leg as well in the right leg.  X-rays of the brain and cervical  spine were negative, but the MRI of the lumbar thoracic area showed a  mass in the T12-1 level.  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  The patient also is going to have a  coronary bypass, we talked about doing the coronary artery bypass, but  the Cardiology decided that it would be better to go ahead and take care  of the mass in the thoracic area prior to the CABG.  I talked to the  patient, to the sister to the 2 case.  We talked about what all of these  mean.  With them in the room, we googled the diagnosis of astrocytoma  and ependymoma.  They had question, all of the questions were answered.  The decision was to do surgery.  If this is ependymoma, it will be  subsequently resected that would be the case.  If this is astrocytoma or  far  way possibility of infarct, then biopsy will be done.  The risk of  cord infection, CSF leak, worsening of the weakness with completely  paraplegia, they fully agreed.   PROCEDURE:  The patient was taken to the OR, and prior to put into  sleep, we put a Swan-Ganz.  A line was also inserted.  Then, she was  positioned in a prone manner and the thoracolumbar area was cleaned with  DuraPrep.  X-rays showed that we were right at the level of T12-L1.  Then, midline incision from lower part of T10 down to L1-L2 was made and  muscles were retracted laterally.  Another x-ray was repeated, which  showed that we were at the level of T12-L1.  Then, the spinous processes  were removed and drilled the lamina.  We were able to see the spinal  cord all the way from the lower part of T10 down to L1.  We  brought the  microscope into the area and we opened the dura mater in the midline.  The arachnoid was opened.  Immediately, we found that the area of the  canal was intact.  Right above the T12, there was swelling in the spinal  cord that was a large vein in the middle.  We dissected the vein and  mobilized the vein away from the midline.  The midline was painted  because of the swelling in the spinal cord.  We localized an area, which  has a no vascularization whatsoever.  Using the #11 blade, we made an  incision in the midline and we immediately about 3-4 mm from the surface  with finding abnormal tissue.  Several samples were taken and indeed to  a gross inspection the tumor looked like an astrocytoma.  It is  important to note that this procedure was done with the electrical  monitoring.  There was no changes whatsoever during the monitoring and  the biopsy.  Subsequently, the specimen was sent to the laboratory.  The  pathology had difficulty making diagnosis, and at the end, we sent more  specimen and we told them that to use the frozen permanent biopsy.  At  the end, the area was irrigated.   Hemostasis was accomplished.  Ten  minutes after we sent the biopsy and we noticed that pathologist not  doing anything with the procedure itself just sitting and waiting for  the phone call.  The technician suddenly realized that she was losing  the monitoring of the left leg and there was no wave present.  This goes  along with the clinical finding prior to surgery of the dense weakness  of the left leg.  Once we accomplished good hemostasis, the dura mater  was closed with a 4-0 Nurolon.  Then, Tisseel was left in the epidural  space for better closure.  Then, the wound was closed with Vicryl and  Steri-Strips.  The patient is going to PACU and eventually will go to  the intensive care unit.           ______________________________  Hilda Lias, M.D.     EB/MEDQ  D:  10/14/2008  T:  10/15/2008  Job:  [id number]

## 2010-11-20 NOTE — Assessment & Plan Note (Signed)
OFFICE VISIT   Veronica Copeland, Veronica Copeland  DOB:  05-26-1946                                        April 27, 2009  CHART #:  [id number]   The patient returns to the office today in followup after her off-pump  coronary artery bypass grafting x2 done on March 29, 2009.  The  patient was having unstable anginal symptoms in the phase of physical  therapy trying to recover from resection of a spinal tumor.  Since  surgery, she has been staying at a nursing facility with physical  therapy ongoing.  She has had no recurrent anginal symptoms.  The arm  and jaw pain which she had been having preoperatively has resolved.  She  has had very mild edema in the left leg, none on the right.  Denies  fever or chills.  Followup chest x-ray shows decreasing small pleural  effusions, otherwise clear.   On exam, her blood pressure is 139/71, pulse 67, respiratory rate is 18,  O2 sat is 95%.  Sternum is stable and well healed.  There is small  amount of eschar at chest tube site but without infection.  Legs are not  tender.  She has mild edema at the left ankle, none at the right.  The  endovein harvest site in the left leg are well healed.   IMPRESSION:  The patient continues on amiodarone 200 mg a day, ferrous  gluconate 325 b.i.d., folic acid, metformin 500 b.i.d., Xanax, aspirin,  diazepam, fexofenadine 180 mg a day, and pravastatin.  Overall, she  seems to be making good progress.  It should be noted that Dr. Rosalyn Charters  office had performed a CT scan on their office scanner in the past and  there was a question of a 4-mm nodule in the right lung.  At the time of  surgery, this could not be evaluated, although no definite mass was  felt, it could easily be missed.  I have discussed this with her and  recommended that we obtain a followup CT scan in approximately 6 months.  I will plan to see  her back in 1 month, and at that time we can coordinate with Dr.  Rosalyn Charters office  the followup CT scan which was originally done in their  office.    Sheliah Plane, MD  Electronically Signed   EG/MEDQ  D:  04/27/2009  T:  04/28/2009  Job:  [id number]   cc:   Arturo Morton. Riley Kill, MD, [facility]

## 2010-11-20 NOTE — Assessment & Plan Note (Signed)
OFFICE VISIT   Veronica, Copeland  DOB:  February 03, 1946                                        March 08, 2010  CHART #:  [id number]   The patient returns to the office today in followup visit.  She  underwent off-pump coronary artery bypass grafting x2 March 29, 2009.  Her preoperative course was complicated by presentation with  acute myocardial infarction, lower extremity weakness with cord  compression and ultimately required neurosurgical decompression of the  mass in her spine.  There was no firm pathologic diagnosis was made.  She slowly recovered from this and ultimately underwent coronary artery  bypass grafting for continued angina.  At this point she seems to be  making some progress.  She is able to get around with braces and a  walker and notes that over the past several months she has gained some  strength.  After last seen she had a followup CT scan on September 18, 2009,  by Dr. Riley Kill.  At that time she was noted to have a stable right lung  nodule and a 74-month followup was recommended.  At this point she seems  to be making slow but some progress with her spinal cord issues.  I have  made her followup appointment to see me with a CT scan of the chest in 6  months to check on the lung nodule.   Sheliah Plane, MD  Electronically Signed   EG/MEDQ  D:  03/08/2010  T:  03/09/2010  Job:  [id number]   cc:   Arturo Morton. Riley Kill, MD, [facility]

## 2010-11-20 NOTE — Discharge Summary (Signed)
Veronica Copeland, Veronica Copeland               ACCOUNT NO.:  [id number]   MEDICAL RECORD NO.:  [id number]          PATIENT TYPE:  INP   LOCATION:  2025                         FACILITY:  [facility]   PHYSICIAN:  Valerie A. Felicity Coyer, MDDATE OF BIRTH:  06/07/1946   DATE OF ADMISSION:  10/02/2008  DATE OF DISCHARGE:  10/21/2008                               DISCHARGE SUMMARY   DISCHARGE DIAGNOSES:  1. Three-vessel coronary artery disease, status post acute myocardial      infarction at admission.  Continue medical management, pending      recovery of neuro function and question eventual coronary artery      bypass graft, outpatient followup with Cardiology to determine.      See details below.  2. 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  See details below.  3. Gram-negative rod urinary tract infection.  Continue Cipro.  4. Urinary retention exacerbated by decreased mobility from above with      narcotic use.  Continue Foley cath at this time, pending recovery  5. Hypertension.  Continue medical management.  6. Type 2 diabetes, uncontrolled prior to admission with improvement      post-insulin adjustment.  Continue ongoing treatment with titration      and followup.  7. Dyslipidemia.  8. Anemia, mild acute blood loss postoperative, but also chronic      disease.  Hemoglobin is stable greater than 72 hours.  Discharge      hemoglobin 10.7.   DISCHARGE MEDICATIONS:  1. NovoLog sliding scale insulin t.i.d. and nightly.  2. Pravastatin 10 mg p.o. daily.  3. Imdur 120 mg p.o. daily.  4. Aspirin 325 mg p.o. daily.  5. Colace 100 mg p.o. b.i.d.  6. Lantus 25 units subcu b.i.d. at 8 a.m. and 8 p.m.  7. Lovenox 40 mg subcu daily for prophylaxis.  8. Vasotec 5 mg p.o. b.i.d.  9. Toprol-XL 200 mg p.o. daily.  10.Cipro 400 mg IV q.12 h. through p.m. dose October 25, 2008, to      complete  7 days.  11.Nitroglycerin 0.4 mg sublingual q.5 minutes p.r.n. chest pain.  12.Milk of magnesia 30 mL p.o. p.r.n. constipation.  13.Zofran 4 mg p.o. or IV q.4 h. p.r.n. nausea or vomiting.  14.Percocet 5/325 one p.o. q.3 h. p.r.n. pain.   DISPOSITION:  The patient is discharged to inpatient rehab when a bed is  available, anticipated October 21, 2008, plans at this time are for  discharge without further followup or change in medical regimen, will be  notified with addendum to follow should there be changes to report as  listed here.   Left lower extremity strength was less than 1/5, constant since  postoperative changes October 14, 2008, with 4+/5 on the right lower  extremity.   CONSULTANTS:  1. [facility], Arturo Morton. Riley Kill, MD, [facility]  2. Thoracic and Cardiovascular Surgery, Sheliah Plane, MD  3. Neurosurgery, Hilda Lias, MD   CONDITION ON DISCHARGE:  Medically  stable.   HOSPITAL COURSE:  1. Acute MI at admission.  The patient is a 65 year old woman with      multiple chronic medical issues, who had come to the emergency room      complaining of leg pain and weakness symptoms.  This is felt to be      sciatica per ER report, but upon preparation for discharge from ER,      it was noted that the patient also complained of ache and      discomfort in her chest and neck.  This was felt to be a possible      anginal equivalent and she was reevaluated for cardiac disease and      found to be having an acute MI with positive cardiac enzymes.  She      was taken to the cath lab on October 03, 2008, with diffuse three-      vessel disease and initially considered for CABG, pending better      diabetic control.  As adjustments were made in her medication, it      became apparent that the patient's complaints of leg pain and      weakness would present adequate rehab following CABG, so further      neurologic evaluation was pursued.  Please see problem next      regarding spinal  cord tumor, which was discovered this      hospitalization.  After discovery of this tumor, it was decided      that a CABG was not the appropriate therapy for this patient after      further review of the cath result findings and a decision was made      by Thoracic Surgery as well as Cardiology to continue aggressive      medical management of her coronary artery disease with      reconsideration of CABG in the future, pending her outcome and      neurologic recovery.  The patient has had no further pain symptoms      or anginal equivalent during this hospitalization and is felt from      a cardiac standpoint stable to continue with rehab as needed.      Medications as above.  2. Spinal cord tumor.  Again in the evaluation of the patient's leg      symptoms, she underwent initially an MRI of her brain to rule out      stroke and then an MRI of her spine, which suggested an infarct on      the noncontrasted imaging.  Repeat scans with contrast clarified      that it was a tumor, not an infarct causing the patient's pain and      weakness symptoms, and she was seen in consultation by Neurosurgery      at the urging of Neurology.  Once she was cleared to proceed with      surgical resection by Cardiology, she underwent resection on October 14, 2008.  Please see OP report for details of this.  Preliminary      pathology has suggested probable astrocytoma, but final pathology      will not be available for up to 2 weeks as this has been sent to      out-of-[place] evaluation for confirmation of diagnosis.  Dr. [name]      will continue to follow and if this is in fact astrocytoma, pending  recovery of surgical wounds with ongoing rehab and time, but she      will be referred to Radiation Oncology in the future for further      treatment of this tumor.  There is no need or plans for Medical      Oncology at this time and she is felt stable to go to rehab for      hopeful support and  recovery of her neurologic losses, most notably      that of the use of her left lower extremity.  She has been followed      by PT and OT during this hospitalization and will certainly be      continually worked with on rehab as described.  3. Other medical issues.  [name] diabetes, hypertension, and      dyslipidemia have been ongoing with medical management and changes      during this hospitalization with overall stabilization at the time      of discharge.  She has been discovered to have a urinary tract      infection and has been initiated on Cipro prior to discharge and      will continue through a 7-day course for treatment of same.  ID and      culture still pending at the time of dictation.  She is required an      indwelling Foley catheter as when this was discontinued 2 days      prior to this dictation, she persisted with urinary retention      despite in and out cath on 2 separate occasions having greater than      a liter of urine within her bladder.  We were aggressively treating      associated constipation as well as      urinary tract infection and are hopeful that this retention will      resolve and this can be an ongoing issue with followup ongoing at      rehab as described.   Over 30 minutes was spent on day of discharge coordination with time of  this dictation for planning of discharge.      Valerie A. Felicity Coyer, MD  Electronically Signed     VAL/MEDQ  D:  10/20/2008  T:  10/21/2008  Job:  [id number]

## 2010-11-20 NOTE — Consult Note (Signed)
NAMETIMBER, MARSHMAN NO.:  [id number]   MEDICAL RECORD NO.:  [id number]          PATIENT TYPE:  INP   LOCATION:  2022                         FACILITY:  [facility]   PHYSICIAN:  Melvyn Novas, M.D.  DATE OF BIRTH:  [birth year]   DATE OF CONSULTATION:  DATE OF DISCHARGE:                                 CONSULTATION   ADDENDUM:   CHIEF COMPLAINT:  The patient had complained about bilateral gait  dysfunction and leg weakness.  Please see the consultation from October 06, 2008 for details.   Meanwhile, we have received an MRI of the lumbar spine and thoracic  spine.  The MRI was repeated on October 10, 2008 with contrast and shows  clearly an enhancing lesion at the T12 level.  I have requested a  neurosurgical consultation and Dr. Hilda Lias is seeing the patient  today.  I have also discussed her case with Dr. Rene Paci, her  hospitalist.  One of the concerns raised is that this patient has  unstable angina and that while she presented primarily for lower  extremity pain and lower back pain to the ER, she is there  complained  about additionally having chest pain and shortness of breath and tested  positive for diffuse coronary arthrosclerosis in an already bypassed  vessel.  The patient was deemed to be not a candidate for an angioplasty and it  was discussed with her to repeat the bypass surgery.  Cardiology and  Thoracic Surgery as well as the neurosurgeon will have to decide which  problem has to be approached first and if the patient is cardiologically  stable enough to undergo a lower back spinal cord tumor surgery.  Neurology will follow as needed.  Please contact us for further help  under [phone].  I will sign off from this patient's service as of  today.  Dr. [name] told me that he is planning surgery for Thursday or Friday  this week.  On October 12, 2008, Wednesday, there is an oncology consult planned, but I  did not want to call in after hours  today.  Depending on the type of tumor tissue and the degree of possible  malignancy, we would appreciate the help of the  Oncology colleagues.        Melvyn Novas, M.D.  Electronically Signed     CD/MEDQ  D:  10/11/2008  T:  10/12/2008  Job:  [id number]   cc:   Vikki Ports A. Felicity Coyer, MD  Mamie Levers, M.D.

## 2010-11-20 NOTE — H&P (Signed)
NAMEPRINCELLA, JASKIEWICZ NO.:  [id number]   MEDICAL RECORD NO.:  [id number]          PATIENT TYPE:  EMS   LOCATION:  MAJO                         FACILITY:  [facility]   PHYSICIAN:  Wendi Snipes, MD DATE OF BIRTH:  [birth year]   DATE OF ADMISSION:  10/02/2008  DATE OF DISCHARGE:                              HISTORY & PHYSICAL   CARDIOLOGIST:  Arturo Morton. Riley Kill, MD, [facility].   PRIMARY DOCTOR:  Dr.  Jonny Ruiz.   CHIEF COMPLAINT:  Right neck pain and right lower back pain.   HISTORY OF PRESENT ILLNESS:  This is a 65 year old African American  female with a history of three-vessel coronary disease deemed not  amenable to revascularizations here with right hip and buttock pain.  She was evaluated in the ED and this was determined to be sciatica with  symptoms occurring over the past week and being described as right hip  dullness with sharp electric like pain shooting down the back of her leg  upon movement.  She was given pain medications and prepared for  discharge with close followup with her primary care physician when she  mentioned that she was having neck pain.  At this time the nurse  inquired more about this and it was reported to be her anginal  equivalent.  A 12-lead EKG was performed and showed new inferior lateral  ST depressions approximately 1 mm.  Her heart rate was 100 at the time.  She was re-triaged to the emergency department and placed in the chest  pain pathway.  She was given nitroglycerin and Nitro paste.  After this  medication her chest pain was reported to resolve and her subsequent EKG  showed resolution of her aforementioned ST depressions.  Upon further  questioning it appears that the patient has had longstanding neck aching  with increasing dyspnea on exertion.  These have been her symptoms for  quite some time.  She saw Dr. Riley Kill last in the clinic in April 2008  for this same neck pain.  She has been evaluated with stress Myoview in  2005  for these same symptoms which showed minimal evidence of reversible  ischemia.  She does indicate that her dyspnea on exertion and weakness  has been getting worse over the last few months.  However, she states  that her neck pain occurs on a daily basis and can happen any time and  without exertion, and has been stable for years.  She is currently chest  pain free.   PAST MEDICAL HISTORY:  1. Coronary disease status post cardiac catheterization in 2000      showing a normal ejection fraction, mid and distal LAD with 70-80%      stenosis.  Her D1 had an 80% stenosis.  Circumflex had a 90%      stenosis and her proximal and mid RCA had diffuse disease with 80%      stenosis.  2. Insulin dependent diabetes.  3. Hyperlipidemia.  4. Hypertension.   ALLERGIES:  PENICILLIN.   MEDICATIONS:  1. Metformin 500 grams mg twice daily.  2. Cyclobenzaprine 5 mg  3 times daily.  3. Norvasc 5 mg daily.  4. Metoprolol XL 50 mg daily.  5. Aspirin 81 mg daily.  6. Pravastatin 10 mg daily.  7. Imdur 60 mg daily.  8. Fexofenadine 180 mg daily.  9. Alendronate 70 mg every week.  10.Levemir 100 units as directed.  11.Nitroglycerin as needed.   SOCIAL HISTORY:  She lives in [place] by herself.  She works at [employer]  of [place].  She has never smoked.  She does not use alcohol.   FAMILY HISTORY:  Insignificant for early coronary disease.   REVIEW OF SYSTEMS:  All 14 systems were reviewed and were negative  except those mentioned in detail in the HPI.   CODE STATUS:  Full.   PHYSICAL EXAM:  Her blood pressure is 177/80, respiratory rate is 18  breaths per minute, pulse is 104 and regular.  She is afebrile.  She is  satting 98% on 2 liters nasal cannula.  GENERALLY:  She is a 65 year old African American female appearing  stated age in no acute distress.  HEENT:  Moist mucous membranes.  Pupils are equal, round and react to  light and accommodation.  Anicteric sclera.  NECK:  No jugular venous  distention.  CARDIOVASCULAR:  Regular rate and rhythm.  No murmurs, rubs or gallops.  LUNGS:  Clear  to auscultation bilaterally.  ABDOMEN:  Nontender, nondistended.  Positive bowel sounds.  No masses.  EXTREMITIES:  No clubbing, cyanosis or edema.  NEUROLOGIC:  Alert and oriented x3.  Cranial nerves II-XII are grossly  intact.  No focal neurologic deficits.  SKIN:  Warm, dry and intact.  No rashes.  PSYCH:  Mood and affect are appropriate.   RADIOLOGY:  Her chest x-ray showed no acute process.  EKG showed normal  sinus rhythm with a rate of 106 beats per minute with inferior lateral  ST depressions and subsequent resolution on repeat study.   LABORATORIES:  CBC is 11, hematocrit is 44, platelets 219, potassium  4.4, creatinine is 1.1.  Her cardiac enzymes are unremarkable.   ASSESSMENT/PLAN:  This is a 65 year old with severe three-vessel  coronary disease previously not a candidate for revascularizations, here  with chronic neck pain and increasing dyspnea on exertion with dynamic  electrocardiogram changes.  Unstable angina.  We will treat her currently as unstable angina in  context of her dynamic electrocardiogram changes.  We will also include  her other medications and increase her long acting nitrate and beta  blocker for her chronic chest pain.  We will continue to cycle cardiac  enzymes to rule out a myocardial infarction.  We will discuss further  strategies to alleviate her current symptoms, which is believed to be  her anginal equivalent and severely limiting her quality of life.  We  will keep her nothing by mouth after midnight in case of escalation in  symptoms and  dynamic electrocardiogram changes will warrant evaluation of her  coronary anatomy by cardiac catheterization.  We will also order an  echocardiogram to look for any new wall motion abnormalities or decrease  in left ventricular ejection fraction.      Wendi Snipes, MD  Electronically  Signed     BHH/MEDQ  D:  10/02/2008  T:  10/02/2008  Job:  [id number]

## 2010-11-20 NOTE — Consult Note (Signed)
NAMELEXANY, BELKNAP NO.:  [id number]   MEDICAL RECORD NO.:  [id number]          PATIENT TYPE:  INP   LOCATION:  2022                         FACILITY:  [facility]   PHYSICIAN:  Hilda Lias, M.D.   DATE OF BIRTH:  Jul 30, 1945   DATE OF CONSULTATION:  10/11/2008  DATE OF DISCHARGE:                                 CONSULTATION   Ms. Nazario is a lady who was admitted to the hospital because of  cardiovascular complaint.  The patient has a cardioangiogram.  Nevertheless, it was found that she was quite a bit of weak on the left  foot and some mild weakness in the right leg.  The patient has no  complaint whatsoever of any pain.  At the beginning, it was felt that  she might have a stroke.  She had an MRI of the neck and the surgical  area as well as the brain.  The surgical area showed mild foraminal  narrowing.  On top of that the patient not only complaints of weakness  but also she has been having some problem with bladder and bowel.  According to her, the bladder is __________.  This weakness has been  going on for several weeks and prompted to do an angiogram which was  approached through the right vertebral area.  The patient had an MRI of  thoracic area and we were called for evaluation.   Clinically, the patient mentally is normal.  Cranial nerves are normal  but in the lower extremity she has 2/5  weakness of dorsiflexion of the  left leg, she has had a 3-4/5 weakness of the right iliopsoas.  The  right leg, she has __________ weakness of the right  dorsiflexion,__________.  Reflexes symmetrical.  Sensation seems to be  completely normal.  The rectal tone is decreased.  Gait could not be  tested.   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   RECOMMENDATION:  I talked to her at length.  I think we are dealing here  with an intramedullary tumor and  also it look like it is astrocytoma, we  are not completely 100% sure.  I told her that this problem  unfortunately needs to addressed with surgery.  Surgery would be  thoracolumbar laminectomy with a partial or total resection of the tumor  based on the findings during the surgery.  The risk include paralysis,  infection, neurogenic bladder, as well as bladder and rectal  incontinence, being unable to remove the tumor, infection, hemorrhage.  I will be talking to her again with the family and I will try to show  them the MRIs so they can see what we are dealing with.           ______________________________  Hilda Lias, M.D.     EB/MEDQ  D:  10/11/2008  T:  10/12/2008  Job:  [id number]

## 2010-11-20 NOTE — Consult Note (Signed)
Veronica Copeland, STACEY NO.:  [id number]   MEDICAL RECORD NO.:  [id number]          PATIENT TYPE:  INP   LOCATION:  2923                         FACILITY:  [facility]   PHYSICIAN:  Melvyn Novas, M.D.  DATE OF BIRTH:  03-18-1946   DATE OF CONSULTATION:  10/06/2008  DATE OF DISCHARGE:                                 CONSULTATION   CHIEF COMPLAINT:  Left leg weakness.   HISTORY OF PRESENT ILLNESS:  This is a 65 year old African American  female who was evaluated in the emergency department for right hip pain  on October 02, 2008.  At that time, the hip pain was thought to be  sciatica.  The patient was given pain medications in the emergency  department and about to be discharged when she roprted  that she was  also experiencing chaest tightness and  neck pain .A 12-lead EKG showed  inferior lead ST depression.  The patient was therefor admitted on March 28 for  evaluation of cardiac  symptoms, with consideration of cardiac catheterization to follow.  The patient underwent cardiac catheterization via the left femoral  artery without complications .The catheterization documented mutifocal  or diffuse arteriosclerosis within the previously bypassed vessels. A  stent placement was discussed, but she was found not to be good  candidate.  .  On October 04, 2008, the patient started complaining of left groin  pain, not unusual post procedure.  A CT of the pelvis without contrast  was ordered to evaluate for possible retroperitoneal bleed, as her pain  now involved the hip on the right and flank pain on the right. The CT,  which showed small amount of free fluid in the pelvis and no evidence of  retroperitoneal hematoma, was otherwise normal.  She also underwent a CT abdomen without contrast that showed nodular  density in the right lung base, Radiologist would recommend follow up CT  in 3-6 months for coronary artery disease.  No MRI or CT brain, spine scan was yet obtained  .   After long discussion with the patient, the patient clearly states that  she has had a longstanding right flank/back pain and right leg  discomfort.  This predates the cath approximately 1 year! Worsened  apprx. for 2-3 month ( after Christmas).  When asked about new symptoms, that were not present before the  cathetization was done, the patient denied any new weakness or decreased  sensation throughout her legs.  Her main complaints at this time was  solely  her right leg  scissoring over to the left, as she walked to  the bathroom.  She did mention briefly on October 01, 2008, that she had noticed some  weakness in her left leg, however, this resolved quickly.  The patient denies any spasms of her left leg or back, however, does  state spasms of right flank, back, and left leg.  She states that her  gait feels wobbly and she feels weaker on her right side.  The patient states that she had some decreased sensation bilaterally in  her feet secondary to diabetes.   PAST  MEDICAL HISTORY:  1. Hypertension.  2. Diabetes.  3. High cholesterol.   She has had a cardiac cath in 2000 which showed normal ejection  fraction, mid distal LAD stenosis that was equal to 70-80%, D1 stenosis  equal to 80%, circumflex stenosis 90% with a mid right coronary artery  diffusion stenosis.  Recent cardiac cath show severe diffuse heavily  calcified LAD stenosis, diffuse heavily calcified RCA stenosis, moderate  left circumflex stenosis, normal left ventricular systolic function.  No  significant aortoiliac disease.   MEDICATIONS AT HOME:  1. Metformin 500 mg b.i.d.  2. Flexeril 5 mg t.i.d.  3. Norvasc 5 mg daily.  4. Metoprolol XL 50 mg daily.  5. Aspirin 81 mg daily.  6. Imdur 60 mg daily.  7. Fexofenadine 180 mg daily.  8. Alendronate 70 mg every week.   ALLERGIES:  None.   SOCIAL HISTORY:  The patient does not smoke, drink, or do illicit drugs.  She lives by herself and works in the [place].   REVIEW OF SYSTEMS:  All negative with the exception of above.   PHYSICAL EXAMINATION:  GENERAL:  This is a pleasant, well nourished,  well developed, in no apparent distress, African American female, who is  65 years of age.  SKIN:  Warm, dry, and intact.  PULMONARY:  Clear to auscultation bilaterally.  No rhonchi or wheezing.  Chestwall scar from previous surgery is well healed.  CARDIOVASCULAR:  S1 and S2.  Regular rate and rhythm  NECK:  Negative bruits and supple.  NEUROLOGIC:  She is alert.  She is oriented , but cannot place symptoms  and medical events in a sequential order.  .  She carries out 2 and 3-step commands without any difficulty.   CN:  Pupils are equal, round, slow reactive but accommodating to light.  Conjugate gaze.  Extraocular muscles are intact, no ptosis, nystagmus -  intact fields.  Face is symmetrical and of normal sensation.  Tongue is midline, no fasciculation.  Uvula lifts in  midline.  Sensation V1-V3 of the face full bilaterally.  Shoulder shrug, head turn is full bilaterally.  Coordination: finger-to-nose in the upper extremities moved without  ataxia.  Heel-to-shin moves without ataxia.  Fine motor movements moves  without clumsiness.  Gait,:she feels uneasy,  unsteady.  She has bilateral proximal hip  weakness when getting into a standing position and evident left  footdrop.   Motor:  She has normal tone, and normal bulk.  No tremor, rigidity, or  spasticity.  Bilateral upper extremities are 5/5.  Her right lower  extremity hip flexion is 4/5.  Right knee flexion, extension is 4/5.  Right dorsiflexion, plantar flexion is 4/5.  Left hip flexion is 4/5.  Left flexion, extension of the knee is 4/5, but  Left dorsiflexion is  0/5.  Left plantar flexion is 4/5.  Bilateral abduction, adduction of the hip  is 4/5.  negative straight leg raise bilaterally.  Deep tendon reflexes are  globally 2+ with downgoing toes bilaterally.  The patient  does not have  any drift in the upper or lower extremities.  Sensation is full to pinprick, however, she does have a slight decrease  in vibratory sensation  and fine touch below the ankles, and above /over  her left ankle.   LABORATORY DATA:  HbA1c is 9.5.  UA is negative.  Sodium is 139, potassium is 3.6, chloride 103, CO2 of 25, BUN 11,  creatinine 0.90, and glucose this AM is  205.  White blood cell count 8.4, platelets 223, and hemoglobin and hematocrit  is 10.7 and 31.5.    Triglycerides 114, cholesterol 140, HDL 48, and LDL 69.   IMAGING AND TEST:  No CT or MRI Brain have been obtained at this point.   ASSESSMENT AND RECOMMENDATIONS:  This is a 65 year old African American  female with left footdrop, which is new according to the patient since  catheterization. She was not aware of her footdrop before this  neurological exam took place and it may well have been preexisting.  Differential diagnosis :at this time is a right frontal cerebrovascular  accident of the anterior circulation VERSUS left femoral neuropathy  secondary to femoral catheterization, or simply uncontrolled diabetes.   TO btain an MRI brain, diffusion weighted images only,  to rule out a  fresh cerebrovascular accident,-- if negative ,and if weakness persist  despite physical therapy would recommend outpatient EMG, nerve  conduction of bilateral lower extremities.  I would like to add  that the reflex level for this patient is remarably  brisk, considering her diabetis and neuropathy findings- a lumbal spinal  stenosis or focal foraminal excit stenosis at the lumbar level could  well be present.     ______________________________  Felicie Morn, PA-C      Melvyn Novas, M.D.  Electronically Signed    DS/MEDQ  D:  10/06/2008  T:  10/07/2008  Job:  [id number]   cc:   Arturo Morton. Riley Kill, MD, [facility]

## 2010-11-20 NOTE — Assessment & Plan Note (Signed)
OFFICE VISIT   Veronica Copeland, Veronica Copeland  DOB:  1945/08/09                                        May 25, 2009  CHART #:  [id number]   The patient returns today in followup after her coronary artery bypass  grafting, off pump x2 done on March 29, 2009.  The patient had a  complicated preoperative course, presenting with acute myocardial  infarction.  At the same time, she had symptoms of lower extremity  weakness, ultimately requiring neurosurgical decompression of a tumor,  though no definite tumor type was identified.  Following this, she  continued to have unstable anginal symptoms during rehab.  At this  point, she has now left the rehab facility and back at home since  Monday.  She is able to get around her house with some help with a  walker.  Physical therapy is to start with her in her home.   On exam today, her blood pressure 120/67, pulse is 78, respiratory rate  is 18, and O2 sats 99%.  Her sternum is stable and well healed.  She has  no pedal edema.   Considering her complicated preoperative course, she seems to be making  reasonable postoperative progress.  She is to obtain a followup CT scan  of the chest today in Dr. Rosalyn Charters office.  The CTs done at the time of  her original admission raised the issue of a 4-mm right lung nodule that  was too small to characterize.  At the time of surgery, this could not  be palpated.  I will plan to see her back in 3-4 months to check on her  overall progress.   Sheliah Plane, MD  Electronically Signed   EG/MEDQ  D:  05/25/2009  T:  05/26/2009  Job:  [id number]   cc:   Arturo Morton. Riley Kill, MD, [facility]  Hilda Lias, M.D.

## 2010-11-20 NOTE — Assessment & Plan Note (Signed)
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  Ultimately, she underwent off-pump coronary artery  bypass grafting and from that standpoint has recovered nicely.  However,  she still almost completely bound to her wheelchair and walking with a  walker.  She is noted increasing swelling in her left leg, which has  made ambulation even more difficult.  She has had no neurosurgical  followup since her hospitalization.  She returns today to review the CT  followup.  Her initial CT showed a 5.9 mm nodule at the right base.  A  Followup CT in May 25, 2009 ordered by Dr. Riley Kill showed the right  lower lobe lung nodule unchanged and a new density in the right medial  lung that was probably atelectasis or pneumonia.  A followup CT scan was  recommended.  From the x-ray viewing system, it appears the patient has  a scan ordered for September 18, 2009.   On exam, the patient's blood pressure 129/76, pulse is 104, respiratory  rate 16, and O2 sats 97%.  Her sternum is stable and well healed.  She  has mild edema in the left leg, not in the right leg.  She is in the  wheelchair and needs to be transported in and out of the office.   She did not have any radiographic studies today.  As noted, she is to  have a CT scan by Dr. Riley Kill September 18, 2009.  I plan to see her back in  6 months and I have asked her to have a copy of the CT scan results sent  to me next week after this is done.   Sheliah Plane, MD  Electronically Signed   EG/MEDQ  D:  09/07/2009  T:  09/08/2009  Job:  [id number]   cc:    Arturo Morton. Riley Kill, MD, [facility]  Corwin Levins, MD

## 2010-11-20 NOTE — Discharge Summary (Signed)
Veronica Copeland, Veronica Copeland NO.:  [id number]   MEDICAL RECORD NO.:  [id number]          PATIENT TYPE:  IPS   LOCATION:  4009                         FACILITY:  [facility]   PHYSICIAN:  Ranelle Oyster, M.D.DATE OF BIRTH:  September 27, 1945   DATE OF ADMISSION:  10/21/2008  DATE OF DISCHARGE:                               DISCHARGE SUMMARY   DISCHARGE DIAGNOSES:  1. 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  2. Coronary artery disease with myocardial infarction.  3. Hypertension.  4. Insulin-dependent diabetes mellitus.  5. Hyperlipidemia.  6. Urinary retention.  7. Klebsiella urinary tract infection, resolved.  8. Pain management.  9. Subcutaneous Lovenox for deep vein thrombosis prophylaxis.   PROCEDURE:  Status post bilateral thoracic T12-L1 laminectomy October 14, 2008.   This is a 65 year old African American female with history of coronary  artery disease, insulin-dependent diabetes mellitus admitted March 28  with right neck pain, low back pain.  Initially seen in the emergency  department March 27 and discharged home with pain medications, only to  return with chest pain.  EKG with inferior lead, ST depression.  Echocardiogram with ejection fraction of 65% with normal left  ventricular function.  Cardiac marker with mild increase in troponin.  Cardiac catheterization per Dr. Riley Kill of cardiology services with  moderate left circulation stenosis subendocardial infarction.  Plan was  for possible coronary artery bypass grafting.  On March 30 developed  left groin pain.  CT of the abdomen and pelvis negative for hematoma.  MRI of the brain negative for acute changes.  MRI of lumbar thoracic  spine showed a mass at thoracic T12-L1 level.  Noted progressive  paraplegia and neurosurgery, Dr. [name], consulted.  Due to neurological  changes, it was felt surgical intervention was needed, and coronary  artery bypass grafting was placed on hold.  Underwent  bilateral T12-L1  laminectomy with biopsy of intradural mass April 9 per Dr. [name].  Cardiology services follow-up; cardiac status remained stable.  The  patient was receiving a Decadron taper.  Was currently being treated for  a Klebsiella urinary tract infection with Cipro.  Subcutaneous Lovenox  added for deep vein thrombosis prophylaxis.  Bouts of urinary retention,  failed voiding trial with reinsertion of Foley catheter tube.  The  patient was admitted for comprehensive rehab program.   PAST MEDICAL HISTORY:  1. Coronary artery disease with cardiac catheterization in 2000.  2. Insulin-dependent diabetes mellitus.  3. Hypertension.  4. Hyperlipidemia.   No alcohol or tobacco.   ALLERGIES:  PENICILLIN.   SOCIAL HISTORY:  Lives alone, works for [employer].  Local son  works.  Sister from [place] can provide but limited assistance.  Three-level home, bedroom upstairs, 4 steps to entry.   FUNCTIONAL HISTORY:  Prior to admission was independent.   FUNCTIONAL STATUS:  Upon admission to rehab services was total assist to  roll in bed, total assist plus to sit to stand, +2 total assist  transfers with cues.  Minimal assist upper body activities daily living,  total assist lower body activities daily living.   MEDICATIONS PRIOR TO  ADMISSION:  1. Glucophage 500 mg twice daily.  2. Norvasc 5 mg daily.  3. Metoprolol XL 50 mg daily.  4. Aspirin 81 mg daily.  5. Pravastatin 10 mg daily.  6. Imdur 60 mg daily.  7. Cyclobenzaprine 5 mg 3 times daily.  8. Nitroglycerin as needed.   PHYSICAL EXAMINATION:  Blood pressure 177/91, pulse 102, temperature 96,  respirations 18.  This was an alert female in no acute distress, oriented x3.  Deep tendon  reflexes were hypoactive.  Sensation decreased to light touch from the  chest down.  Calves remained cool without swelling or erythema, nontender.  Noted  left foot drop.  Back incision clean and dry without drainage.  LUNGS:   Clear to auscultation.  CARDIAC:  Regular rate and rhythm.  ABDOMEN:  Soft, nontender.  Good bowel sounds.   REHABILITATION HOSPITAL COURSE:  The patient was admitted to inpatient  rehab services with therapies initiated on a 3-hour daily basis  consisting of physical therapy, occupational therapy and 24-hour  rehabilitation nursing.  The following issues were addressed during the  patient's rehabilitation stay.  Pertaining to Ms. Karbowski's incomplete  paraplegia secondary to spinal cord tumor, she had undergone bilateral  T12-L1 laminectomy October 14, 2008, per Dr. [name].  Surgical site was  healing nicely.  Pain management ongoing with the use of imipramine as  well as Neurontin and Flomax with good results.  She is also using  oxycodone for breakthrough pain.  She remained on subcutaneous Lovenox  for deep vein thrombosis prophylaxis throughout her rehab course.  Venous Doppler studies lower extremities on April 21 were negative for  deep vein thrombosis.  Her blood pressures were monitored closely with  care provided by Dr. Riley Kill, cardiology services.  She remained on  Vasotec, Imdur and Toprol with no complaints of chest pain.  It was  initially planned for coronary artery bypass grafting due to diffuse  coronary artery disease.  However, at the discretion of cardiology  services and Dr. Jaynie Collins of thoracic surgery, it was felt this was not  an ideal candidate for coronary bypass grafting at this time and  continue medical management.  She would remain on aspirin therapy 325 mg  daily.  Her blood sugars were monitored closely.  She remained on Lantus  insulin 25 units every 12 hours.  Latest blood sugars of 169, 70, 145.  She remained on Zocor for hyperlipidemia.  Noted voiding trials after  being treated for Klebsiella urinary tract infection with Cipro.  Follow-  up urine study was negative.  Latest I and O catheterizations of 550 mL  as well as 275 mL with very small scant voids.   She had been placed on  Flomax 0.4 mg at bedtime.  It was the feeling that the possible Foley  catheter tube would be needed to help maintain her bladder.   Weekly collaborative interdisciplinary team conferences were held to  discuss the patient's estimated length of stay and any barriers to  discharge.  She was supervision minimal assist for activities of daily  living, minimum to moderate assist standing for activities daily living  tasks, supervision minimal assist overall for basic mobility, mod to max  assist ambulating in parallel bars.  She was moderate to max assist for  complex problem solving skills.  Overall, she showed steady gains but  limited resources as far as family assistance at home which necessitated  the need for skilled nursing facility bed becoming available on November 04, 2008, and discharge taking place at that time.  She had been fitted  with a left PRAFO brace with good results.   Latest labs showed a urine culture:  No growth.  Sodium 137, potassium  4.3, BUN 22, creatinine 1.04.  Hemoglobin 11, hematocrit 32.5, platelet  352,000, WBC of 8.3.   DISCHARGE MEDICATIONS:  At time of dictation included:  1. Zocor 5 mg p.o. daily.  2. Aspirin 325 mg p.o. daily.  3. Vasotec 5 mg p.o. b.i.d.  4. Lantus insulin 25 units subcutaneously every 12 hours.  5. Isosorbide mononitrate 120 mg p.o. daily.  6. Toprol-XL 200 mg p.o. daily.  7. Imipramine 25 mg p.o. bedtime.  8. Neurontin 300 mg p.o. 3 times daily.  9. Flomax 0.4 mg p.o. every night.  10.Robaxin 500 mg p.o. every 6 hours as needed spasms.  11.Nitroglycerin sublingual 0.4 mg as needed chest pain.  12.Oxycodone 5 mg 1-2 tablets every 4 hours as needed pain.   Her diet was diabetic diet.   SPECIAL INSTRUCTIONS:  Continue therapies to promote overall mobility  and well-being.  The patient to follow up Dr. Riley Kill, cardiology  services, as needed; Dr. Faith Rogue at the outpatient rehab service  office; Dr.  [name], neurosurgery [phone] - call for appointment; Dr.  Melvyn Novas medical management.   SPECIAL INSTRUCTIONS:  Left PRAFO brace when in bed.  Foley catheter to  be inserted if no spontaneous voids.  The patient would remain on Flomax  therapy.  Foley catheter tube was to be changed routinely.      Mariam Dollar, P.A.      Ranelle Oyster, M.D.  Electronically Signed    DA/MEDQ  D:  11/03/2008  T:  11/03/2008  Job:  [id number]   cc:   Ranelle Oyster, M.D.  Johns, M.D.  Arturo Morton. Riley Kill, MD, [facility]  Hilda Lias, M.D.  Sheliah Plane, MD  Melvyn Novas, M.D.

## 2010-11-20 NOTE — H&P (Signed)
NAMELAURA, CALDAS NO.:  [id number]   MEDICAL RECORD NO.:  [id number]          PATIENT TYPE:  IPS   LOCATION:  4009                         FACILITY:  [facility]   PHYSICIAN:  Ranelle Oyster, M.D.DATE OF BIRTH:  1946/04/01   DATE OF ADMISSION:  10/21/2008  DATE OF DISCHARGE:                              HISTORY & PHYSICAL   PRIMARY CARE PHYSICIAN:  Penni Bombard, MD   CARDIOLOGIST:  Arturo Morton. Riley Kill, MD, FACC   NEUROSURGEON:  Hilda Lias, MD   CHIEF COMPLAINT:  Lower extremity weakness.   HISTORY OF PRESENT ILLNESS:  This is a 64 year old African American  female with CAD and diabetes, admitted on October 02, 2008 with right neck  and low back pain felt to be secondary to sciatica.  He was initially  seen in the emergency department, but then was discharged home with pain  meds, only to return with chest pain.  EKG showed inferior lead ST  depression.  Echocardiogram showed no obvious changes as she has no  obvious abnormalities.  Cardiac markers showed mild increase in  troponin.  Cardiac cath was done by Dr. Riley Kill and showed moderate left  circulation stenosis and subendocardial infarct.  Plan was for CABG.  However, on October 04, 2008, the patient developed left groin pain and CT  of the abdomen and pelvis was negative for hematoma.  MRI was negative  for acute changes in the brain.  MRI of the lumbar thoracic spine showed  a mass at the T12-L1 level.  The patient had progressive paraplegia and  Neurosurgery was consulted.  Due to neurologic change, it was felt that  surgical intervention was needed first before a CABG was to be  performed.  The patient underwent bilateral T12-L1 laminectomy and  biopsy of intradural mass on October 14, 2008 by Dr. [name].  Path report  still is pending.  The patient has been followed postoperatively by  Cardiology, has remained somewhat stable.  She denies any further chest  pain.  The patient is on Decadron protocol.   She is placed on Cipro for  Klebsiella UTI as of October 18, 2008.  She is on subcu Lovenox for DVT  prophylaxis.  The patient has had problems with urinary retention and  failed the voiding trials, so her Foley catheter was reinserted.  She  was requiring a significant assist with bed mobility and transfers and  is having some issues with pain and ultimately after her initial consult  for rehab readiness on October 11, 2008, she was brought here today for  inpatient admission.   REVIEW OF SYSTEMS:  Notable for weakness, low back pain, reflux, chest  pain, and shortness of breath.   PAST MEDICAL HISTORY:  Positive for CAD with cath in 2000, insulin-  requiring diabetes, hypertension, and hyperlipidemia; negative for  alcohol or tobacco use.   FAMILY HISTORY:  Positive for CAD and diabetes.   SOCIAL HISTORY:  The patient lives alone, works for [employer] of [place].  Local son works.  Sister is from [place] and will assist as needed  at discharge.  She has  a three-level house with 4 steps to enter.  Bedroom is upstairs.   ALLERGIES:  PENICILLIN.   HOME MEDICATIONS:  Metformin, Norvasc, metoprolol, aspirin, pravastatin,  Imdur, cyclobenzaprine, and nitroglycerin.   LABORATORY DATA:  Hemoglobin 10.7, white count 9.6, and platelets 297.  Sodium 137, potassium 4.1, BUN 16, and creatinine 0.9.   PHYSICAL EXAMINATION:  VITAL SIGNS:  Blood pressure is 177/91, pulse is  102, respiratory rate 18, and temperature 98.6.  GENERAL:  The patient is lying in bed generally flat.  EYES:  Pupils equal, round, and reactive to light.  EAR, NOSE AND THROAT:  Generally unremarkable.  NECK:  Supple without JVD or lymphadenopathy.  CHEST:  Clear to auscultation bilaterally without wheezes, rales, or  rhonchi.  HEART:  Slightly tachycardiac, but regular rhythm otherwise.  EXTREMITIES:  No clubbing, cyanosis, or edema.  ABDOMEN:  Soft and nontender.  SKIN:  Notable for back incision, which was clean and  intact.  NEUROLOGICAL:  The patient had decreased sensation below the T12 level.  The left leg essentially was trace out of 2, the right leg was 1-1+/2  for sensation.  Reflexes are trace to absent in both knees and ankles.  Motor function in the right leg is 2-3/5 at the hip and knee and 2/5 at  the right ankle.  Left lower extremity strength is 0/5 at the hip, knee,  and ankle today.  Upper extremity strength is 5/5, normal sensation.  [name] judgment, orientation, and memory all intact.  Mood is a  bit flat as noted above.   POST ADMISSION PHYSICIAN EVALUATION:  1. 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  The patient is status post thoracic and lumbar      laminectomy at T12-L1.  2. The patient is admitted to receive collaborative interdisciplinary      care between the physiatrist rehab nursing staff and therapy team.  3. [name] level of medical complexity and substantial therapy      needs in context of that medical necessity cannot be provided a      lesser intensity of care.  4. The patient has experienced substantial functional loss from her      baseline.  Upon functional assessment at the time of preadmission      screening, the patient was mod to max assistance with most mobility      and self-care.  Currently, the patient is more of mod to max assist      level still with mobility, min assist upper extremity ADLs and      total assist lower body ADLs.  Based on the patient's diagnosis,      physical exam, and functional history, she has a potential for      functional progress, which will result in measurable gains while on      inpatient rehab.  These gains will be of substantial and practical      use upon discharge to home in facilitating mobility and self-care.      Interim changes in medical status since preadmission screening are      detailed above.  5. Physiatrist will provide 24-hour management of medical  needs as      well as oversight of the therapy plans/treatment and provide      guidance as appropriate regarding interaction of the two.  Medical      problem list and plan are listed below.  6. 24-hour rehab nursing  will assist in management of the patient's      bowel and bladder needs.  She has a Foley catheter now, but we will      transition to a voiding trial and potentially intermittent      catheterizing program.  She had some notion when her bowels need to      move, so will try to encourage a regular a.m. bowel program.      Nursing also will help integrated therapy concept techniques, help      with pain management, etc.  7. PT will assess and treat for lower extremity strength and mobility      as well as pain management, safety awareness, adaptive equipment      with goals at a supervision level to perhaps min assist.  She may      have some short distance ambulation, goals pending on motor return      in the left leg.  8. OT will assess and treat for upper extremity use and ADLs and      adaptive techniques, considering her lower extremity neurological      deficits.  Goals are supervision to modified independent.  9. Case management.  Social worker will assess and treat for      psychosocial issues and discharge planning.  10.Team conferences will be held weekly to assess progress towards      goals and to determine barriers at discharge.  11.The patient has demonstrated sufficient medical stability and      exercise capacity to tolerate at least 3 hours of therapy per day      at least 5 days per week.  12.Estimated length of stay is 3 weeks.  Prognosis is fair to good.   MEDICAL PROBLEM LIST AND PLAN:  1. CAD/MI:  Continue aspirin, Vasotec, sublingual nitro and Imdur for      now.  The patient denies symptoms currently.  We will need to work      on rate control and watch closely for tolerance of increased      exertion in therapies.  Cardiology will follow along.   2. Heart rate control and blood pressure control on Vasotec and      Toprol.  See above as well.  3. Diabetes:  We will check CBGs a.c. and at bedtime.  Continue Lantus      insulin 25 units q.12 h. and adjust accordingly.  4. Urinary retention:  Continue Foley catheter for now.  We will      consider voiding trial, Monday morning, putting on her mobility      levels.  5. UTI:  The patient remains on Cipro since October 18, 2008.  We will      continue with a 7-day course.  Reculture as appropriate.  6. Pain management with oxycodone and Robaxin.  7. DVT prophylaxis subcu Lovenox 30 q.12 h.  The patient is at a high      risk for DVT.      Ranelle Oyster, M.D.  Electronically Signed     ZTS/MEDQ  D:  10/21/2008  T:  10/22/2008  Job:  [id number]   cc:   Arturo Morton. Riley Kill, MD, [facility]  Sheliah Plane, MD  Hilda Lias, M.D.  Penni Bombard, MD

## 2010-11-23 NOTE — Assessment & Plan Note (Signed)
[facility]                            CARDIOLOGY OFFICE NOTE   NAME:Veronica Copeland, Veronica Copeland                      MRN:          [id number]  DATE:10/15/2006                            DOB:          1946/02/27    This is a pleasant 65 year old African American female patient of Dr.  Shawnie Pons who has a history of multi-vessel coronary artery  disease.  Last cath in 2000 by Dr. Juanda Chance with a 70% - 80% mid and  distal LAD, 80% diagonal, 90% circumflex, 80% proximal and mid-RCA with  diffuse distal disease and normal LV functions.  Lesions were not  favorable for PCI or CABG and medical therapy was recommended.  Her last  Cardiolite scan was in 2005 and had minimal superimposed ischemia.   The patient comes in today and says that whenever she goes up a flight  of stairs or walks across a parking lot she gets pain in her neck and  shoulders, if she stops it goes away.  This also occurs if she attempts  any types of exercise.  She says this has been going on for several  months but probably was not happening when she saw Dr. Riley Kill back in  September of 2007.  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  She saw Dr. Jonny Ruiz for a general medical checkup and he  drew blood work and she said there were no medicine changes.   CURRENT MEDICATIONS:  1. Lexapro 10 mg daily.  2. Lisinopril 20 mg daily.  3. Norvasc 5 mg daily.  4. Pravachol 10 mg daily.  5. Imdur 60 mg daily.  6. Metoprolol 25 mg daily.  7. Metformin 500 mg b.i.d.  8. Aspirin 325 mg daily.  9. FlexPen insulin.   PHYSICAL EXAMINATION:  This is a pleasant 65 year old African American  female in no acute distress. Blood pressure is 136/68, pulse 72, weight  131.  NECK:  Without JVD, HJR, bruit, or thyroid enlargement.  LUNGS:  Clear  anterior, posterior, and lateral.  HEART:  Regular rate and rhythm at 72 beats per minute; normal S1, S2.  No murmur, rub, bruit, thrill, or heave noted.  ABDOMEN:  Soft without organomegaly, masses, lesions, abnormal  tenderness.  EXTREMITIES:  Without cyanosis, clubbing, or edema; she has good distal  pulses.   EKG done at Dr. Raphael Gibney today shows a normal sinus rhythm, poor R wave  progression; no acute change from prior tracing.   IMPRESSION:  1. Exertional bilateral shoulder pain, rule out anginal equivalent.      She also gets out of breath when she is going up stairs or walking      across a parking lot.  This shortness of breath is associated with      the shoulder pain.  2. Multi-vessel coronary artery disease on catheterization in 2000;      not a good candidate for revascularization surgery  or percutaneous      coronary intervention at that time.  3. History of insulin dependent diabetes mellitus.  4. History of proliferative diabetic retinopathy.  5. History of protein in the urine.  6. Hyperlipidemia.   PLAN:  At this time because of her worsening in symptoms I recommend a  stress Myoview, she said she needs to check her work schedule and will  call us back with a time that she could do this.  She will then follow  up with Dr. Riley Kill after her stress Myoview.      Jacolyn Reedy, PA-C  Electronically Signed      Arturo Morton. Riley Kill, MD, [facility]  Electronically Signed   ML/MedQ  DD: 10/15/2006  DT: 10/15/2006  Job #: [id number]

## 2010-11-23 NOTE — Assessment & Plan Note (Signed)
[facility]                              CARDIOLOGY OFFICE NOTE   NAME:Veronica Copeland                      MRN:          [id number]  DATE:04/03/2006                            DOB:          1945-08-29    Veronica Copeland is in for followup.  In general, she is doing well.  She has  had a little bit of neck discomfort, but it occurs in the back of the neck  and is associated with cracking when she turns her neck.  She has had  absolutely no chest pain.  She has had followup with regard to her sugar  with Dr. Jonny Ruiz, and unfortunately her hemoglobin A1c was 10.4.  Her lipids,  however, were well controlled with an LDL of 72.   MEDICATIONS:  1. Zoloft p.r.n.  2. Metformin 1000 mg b.i.d.  3. Lisinopril 20 mg daily.  4. Norvasc 5 daily.  5. Lorazepam 0.5 mg q.h.s.  6. Pravachol q.h.s.  7. Imdur 60 mg daily.  8. Aspirin 80 mg daily.  9. Ocuvite daily.  10.Metoprolol 25 mg daily.  11.Insulin as prescribed by Dr. Jonny Ruiz.   PHYSICAL EXAMINATION:  VITAL SIGNS:  The blood pressure is 129/71, the pulse  is 87.  LUNGS:  The lung fields are clear.  CARDIAC:  Rhythm is regular.  EXTREMITIES:  No edema.   The electrocardiogram demonstrates normal sinus rhythm.  There is a lower  voltage QRS, but this is unchanged from previous tracings.   IMPRESSION:  1. Insulin-dependent diabetes mellitus.  2. Multivessel coronary artery disease previously catheterized by Dr.      Juanda Chance and felt not a good candidate for revascularization surgery or      percutaneous coronary intervention or coronary artery bypass grafting.  3. History of proliferative diabetic retinopathy.  4. History of protein in the urine.   PLAN:  1. Continue aggressive medical therapy.  2. Return to clinic in six months.  3. Careful discussion today about reporting any progressive symptoms.       Arturo Morton. Riley Kill, MD, [facility]     TDS/MedQ  DD:  04/03/2006  DT:  04/05/2006  Job #:   [id number]

## 2010-11-23 NOTE — Discharge Summary (Signed)
Veronica Copeland, Veronica Copeland NO.:  [id number]   MEDICAL RECORD NO.:  [id number]                   PATIENT TYPE:  OIB   LOCATION:  3705                                 FACILITY:  [facility]   PHYSICIAN:  Guadelupe Sabin, M.D.             DATE OF BIRTH:  04-14-1946   DATE OF ADMISSION:  05/31/2003  DATE OF DISCHARGE:  06/01/2003                                 DISCHARGE SUMMARY   This was a planned outpatient surgical admission of this 65 year old insulin-  dependent diabetic admitted with chronic and recurrent vitreous hemorrhage  of the right eye and extensive proliferative retinopathy with marked  preretinal membrane formation.   HOSPITAL COURSE:  The patient was evaluated preoperatively and felt to be in  satisfactory condition for the proposed surgery.  The patient underwent a  complex posterior vitrectomy with extensive removal of the vitreous  hemorrhage and proliferative diabetic retinopathy with peeling and excision  of the fibrovascular tissue.  Panretinal laser photocoagulation was applied.  The patient tolerated the hour and a half to two-hour procedure under  general anesthesia and was taken to the recovery room and subsequently to  the 23-hour observation unit.  The patient was followed and seen on the  evening of surgery and the following morning.  Blood sugars were slightly  variable but acceptable.  The patient was felt to have achieved maximal  hospital benefit the following morning and was therefore discharged home to  be followed in the office.  The patient was given a printed list of  discharge instructions on the care and use of the operated eye.  Discharge  ocular medications included TobraDex and Cyclomydril ophthalmic solutions 1  drop to the operated eye four times a day and Cyclomydril ophthalmic  solution 1 drop four times a day to the operated right eye.  At bedtime, the  patient was to use atropine and Maxitrol ointment.  Followup  appointment in  my office 3-5 days.   CONDITION ON DISCHARGE:  Improved.   DISCHARGE DIAGNOSES:  1. 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  2. Proliferative diabetic retinopathy right eye.  3. Insulin-dependent diabetes mellitus.                                                Guadelupe Sabin, M.D.    Davy Pique  D:  07/08/2003  T:  07/08/2003  Job:  [id number]

## 2010-11-23 NOTE — Letter (Signed)
January 21, 2006     Veronica Copeland  [address]  [place], [place] [address]   RE:  Veronica Copeland, Veronica Copeland  MRN:  [id number]  /  DOB:  1945-12-22   Dear [name]:   This letter is forwarded to you because we have been trying to contact you  about your recent blood tests and the phone numbers from your last visit  appear to have been disconnected or otherwise incorrect.   This letter is sent to let you know that your blood tests were normal with  your hematology studies, cholesterol, thyroid, urine test, electrolytes,  kidney and liver tests.  However, your blood sugar was overall quite  severely elevated.  Although your blood sugar was 132 on July 10th, your  hemoglobin A1c was 10.4.  The hemoglobin A1c shows what your average blood  sugar has been for the past couple of months and 10.4 is severely high.  Our  goal for everyone is 6.5 or less.  A hemoglobin A1c of 10.4 often  corresponds to a blood sugar on average of more than 300 on a daily basis.   Because of your elevated blood sugar that seems to be quite severe, I would  like to refer you to Dr. Everardo All, who is our Endocrinologist at [facility].  Again, as apparently we have an incorrect phone number, we are  sending you this letter to ask you to call and let us know your new phone  number in order that  we can facilitate this referral.  Please call with  your new number at your  convenience, so we can have you see Dr. Everardo All and have you get your blood  sugar under better control.    Sincerely,      Corwin Levins, MD   JWJ/MedQ  DD:  01/21/2006  DT:  01/22/2006  Job #:  [id number]

## 2010-11-23 NOTE — Consult Note (Signed)
Veronica Copeland, Veronica Copeland                           ACCOUNT NO.:  [id number]   MEDICAL RECORD NO.:  [id number]                   PATIENT TYPE:  OIB   LOCATION:  2550                                 FACILITY:  [facility]   PHYSICIAN:  Arturo Morton. Riley Kill, M.D.             DATE OF BIRTH:  1945-11-29   DATE OF CONSULTATION:  05/31/2003  DATE OF DISCHARGE:                                   CONSULTATION   CHIEF COMPLAINT:  My eye hurts.   HISTORY OF PRESENT ILLNESS:  The patient is a 65 year old African-American  female who was seen by Dr. Cecilie Kicks for vitrectomy.  Postoperatively, she has  had some eye pain and with this she has had sinus tachycardia with rates up  into the 110 range.  Accompanied by this, she had some mild ST depression  noted in the inferolateral leads.  As a result, cardiology was consulted.  Importantly, the patient underwent cardiac catheterization in October 2000,  by Dr. Juanda Chance.  At that time, she had an 80% proximal RCA that was  segmental, 80% mid RCA, and small diffuse disease distally.  She had a 90%  ramus intermedius, 80% diagonal, 70 to 80% mid LAD, and 70 to 80% mid distal  LAD.  Her left ventricular function was normal and renal arteries patent.  Dr. Juanda Chance did not feel that the lesions were favorable for PCI or CABG, and  a recommendation was made for medical therapy.  She has really not been seen  by cardiology since that time, although she says that Dr. Graciela Husbands is her  cardiologist.  The patient was seen by our physician's assistant and  metoprolol 5 mg IV x2 administered.  Heart rate has slowed down into the 90s  and 80s, and the patient remains asymptomatic.   PAST MEDICAL HISTORY:  1. Insulin-dependent diabetes for more than 25 years.  2. Hypertension.  3. Positive Cardiolite with apical ischemia in October 2000.  Cardiac     catheterization as noted.  4. Status post endoscopy and colonoscopy with colitis and colonic polyps.  5. Hyperlipidemia.  6. Status post  bilateral cataract.  7. History of laser surgery x3.  8. Status post rotator cuff repair.  9. Status post cesarean section.   ALLERGIES:  PENICILLIN.   MEDICATIONS:  1. Glucophage 500 mg p.o. b.i.d.  2. Ecotrin 325 mg daily.  3. Benazepril 20 mg daily.  4. Norvasc 5 mg daily.  5. Alprazolam 0.5 mg p.r.n.  6. Allegra 180 mg p.r.n.  7. Pravachol 10 mg daily.  8. Imdur 60 mg daily.  9. Humulin regular at 5 a.m., Lente 35 a.m.  10.      Estrogen patch.   FAMILY HISTORY:  [name] mother died of dementia and father of  prostate cancer.  She has one brother and three sisters alive and well.   SOCIAL HISTORY:  She does not smoke, she is divorced.  She  has two children.   REVIEW OF SYSTEMS:  Remarkable for a decline in vision as well as some  hearing loss.  She has had some arthralgias in the neck.  Otherwise  negative.   PHYSICAL EXAMINATION:  GENERAL:  She is an alert woman with an eye patch  over the right eye.  She is well-developed, well-nourished.  VITAL SIGNS:  Temperature is 97.8, pulse 110, respiratory rate 17, blood  pressure 140/80.  SAO2 is 98% on 2 L.  NECK:  No carotid bruits are noted.  Supple.  LUNGS:  Lung fields are clear.  CARDIAC:  Cardiac rhythm is regular.  ABDOMEN:  Soft.  EXTREMITIES:  No edema.   LABORATORY DATA:  Hemoglobin 11.7, hematocrit 35.4, platelet count 234,000,  white count 7500, BUN 15, creatinine 1.2, glucose 152, potassium 3.7.  Total  protein and albumin are normal.  Urinalysis positive for glucose.   Electrocardiogram demonstrates sinus tachycardia with inferolateral T-wave  inversion and some delay in R-wave progression.  I cannot rule out anterior  infarct of indeterminate age.  Baseline EKG done yesterday shows nonspecific  T-wave flattening.   IMPRESSION:  1. Widespread coronary artery disease with probable ischemia secondary to     sinus tachycardia.  2. Sinus tachycardia secondary to lack of beta blocker as well as pain.   3. Status post vitrectomy.  4. Long-standing history of diabetes mellitus.  5. History of hypertension.  6. Hypercholesterolemia.  7. Other problems as noted.   RECOMMENDATIONS:  1. Intravenous metoprolol x2.  2. Add metoprolol to baseline regimen.  3. We will follow with you.                                               Arturo Morton. Riley Kill, M.D.    TDS/MEDQ  D:  05/31/2003  T:  05/31/2003  Job:  [id number]

## 2010-11-23 NOTE — Op Note (Signed)
NAMEMACKENZY, Veronica Copeland NO.:  [id number]   MEDICAL RECORD NO.:  [id number]                   PATIENT TYPE:  OIB   LOCATION:  3705                                 FACILITY:  [facility]   PHYSICIAN:  Guadelupe Sabin, M.D.             DATE OF BIRTH:  03-17-1946   DATE OF PROCEDURE:  05/31/2003  DATE OF DISCHARGE:  06/01/2003                                 OPERATIVE REPORT   PREOPERATIVE DIAGNOSIS:  Advanced proliferative diabetic retinopathy with  chronic and recurrent vitreous hemorrhage.   POSTOPERATIVE DIAGNOSIS:  Advanced proliferative diabetic retinopathy with  chronic and recurrent vitreous hemorrhage.   OPERATION:  Pars plana vitrectomy using vitreous infusion suction cutter,  preretinal membrane peeling and excision, endolaser photocoagulation.   SURGEON:  Guadelupe Sabin, M.D.   ASSISTANT:  Nurse.   ANESTHESIA:  General.   OPHTHALMOSCOPY:  As previously described.   DESCRIPTION OF PROCEDURE:  After the patient was prepped and draped, a lid  speculum was inserted in the right eye.  A peritomy was performed adjacent  to the limbus from the 7 to 3 o'clock position superiorly.  The  subconjunctival tissue was cleaned and three sclerotomy sites prepared at  the 8, 10, and 2 o'clock positions 3.5 mm from the limbus using the MVR  blade.  The 4-mm vitreous infusion terminal was secured in place at the 8  o'clock position.  The fiberoptic light pipe was inserted at the 2 o'clock  position and the handpiece vitreous infusion suction cutter at the 10  o'clock position.  Slow vitreous infusion suction cutting were begun from an  anterior to posterior direction toward the retina.  The dense vitreous  hemorrhage slowly cleared revealing the underlying extensive proliferative  retinopathy.  Marked preretinal membrane formation was present.  The  vitreous scissors, the Michel's pick, and the handpiece of the vitreous  infusion suction cutter were used to  gently excise the membranes.  This was  tedious and complicated portion of the surgery.  Following satisfactory  removal and relief of the multiple membranes and traction on the retinal  surface it was elected to perform endolaser panretinal photocoagulation.  An  additional 760 applications were made surrounding the old bleeding sites and  filling in where the previous panretinal laser photocoagulation had gap  areas.  Indirect ophthalmoscopy was intermittently performed checking the  peripheral retina for retinal tears or detachment and the posterior retina.  The vitreous instruments were withdrawn and the sclerotomy sites then closed  with 7-0 interrupted Vicryl sutures.  The conjunctiva was pulled forward and  closed with a running 7-0 Vicryl suture.  Depo-Garamycin and dexamethasone  were injected in the sub-Tenon's space inferiorly.  Maxitrol and atropine  ointment were instilled in the conjunctival cul-de-sac.  A light patch and  protector shield were applied.  Duration of procedure one and one half to  two hours.  The patient tolerated the  procedure well in general and left the  operating room for the recovery room and subsequently to the 23-hour  observation unit.                                               Guadelupe Sabin, M.D.    Davy Pique  D:  07/08/2003  T:  07/08/2003  Job:  [id number]

## 2010-11-23 NOTE — H&P (Signed)
Veronica Copeland, Veronica Copeland NO.:  [id number]   MEDICAL RECORD NO.:  [id number]                   PATIENT TYPE:  OIB   LOCATION:  3705                                 FACILITY:  [facility]   PHYSICIAN:  Guadelupe Sabin, M.D.             DATE OF BIRTH:  July 01, 1946   DATE OF ADMISSION:  05/31/2003  DATE OF DISCHARGE:  06/01/2003                                HISTORY & PHYSICAL   This was a planned outpatient surgical admission of this 65 year old black  female admitted with a chronic and recurrent vitreous hemorrhage of the  right eye due to proliferative diabetic retinopathy.   HISTORY OF PRESENT ILLNESS:  This patient has a long history of insulin-  dependent diabetes mellitus for almost 25 years.  She has developed  secondary complications of diabetic retinopathy in both eyes.  She had  previous laser panretinal photocoagulation by Dr. Fawn Kirk to both eyes  in 2000.  The patient was first seen in my office on September 07, 1998  complaining of blackness in her right eye.  Examination revealed a visual  acuity without correction of 20/70 right eye, 20/60 left eye; with  correction 20/40- right eye, 20/+2 left eye.  Applanation tonometry 16 mm  each eye.  Slit lamp examination revealed cataract formation in both eyes  cortical and nuclear type and some anterior subcapsular changes.  Detailed  fundus examination revealed preproliferative diabetic retinopathy with  vitreous hemorrhage of the right eye.  Details were somewhat obscure with  scattered blot and dot hemorrhages, venous dilation, hard exudates adjacent  to the macula in the right eye.  The left eye showed similar haze with  attenuated arterioles, increased light reflex, macular hard exudates,  surface neovascularization of the optic nerve.  It was felt the patient  warranted observation and possible surgery.  The patient returned to the  office with similar complaints in 2002/2003.  Observation revealed  extensive  now proliferative retinopathy in the right eye with dense vitreous membranes  and the old panretinal laser photocoagulation scars.  Further vitreous  bleeding occurred and it was elected to proceed with vitrectomy surgery of  the right eye.  The patient was given oral discussion and printed  information concerning diabetic retinopathy and vitrectomy surgery.  She  signed an informed consent and arrangements were made for her outpatient  admission.   PAST MEDICAL HISTORY:  The patient is under the care of Dr. Jonny Ruiz.  Dr. Jonny Ruiz  felt that the patient was in fair general health, taking multiple  medications, and was in satisfactory condition for the proposed surgery.  The aspirin which the patient had been taking intermittently was temporarily  discontinued in preparation for the surgery.   MEDICATIONS:  Other medications currently taken under the care of Dr. Jonny Ruiz  included:  1. Insulin.  2. Glucophage.  3. Pravachol.  4. Toprol.  5. Norvasc.  6. Estradiol.  7. Metformin.  8. Isosorbide.  9. Allegra.  10.      Lexapro.   ALLERGIES:  The patient is said to be allergic to PENICILLIN.   REVIEW OF SYSTEMS:  No current cardiorespiratory complaints.   PHYSICAL EXAMINATION:  VITAL SIGNS:  As recorded on admission:  Blood  pressure 114/67, pulse 83, respirations 18, temperature 97.3.  GENERAL:  The patient is a pleasant, well-nourished, well-nourished, 57-year-  old black female in no acute distress.  HEENT:  Eyes:  See above for detailed history and findings.  CHEST:  Lungs clear to percussion and auscultation.  HEART:  Normal sinus rhythm.  No cardiomegaly, no murmurs.  ABDOMEN:  Negative.  EXTREMITIES:  Negative.   ADMISSION DIAGNOSES:  1. 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 Proliferative diabetic retinopathy right eye and left eye.  3. Diabetes mellitus, insulin-dependent type.   SURGICAL PLAN:  A pars plana vitrectomy with a probable membrane  peeling and  excision and endolaser panretinal photocoagulation.                                                Guadelupe Sabin, M.D.    Davy Pique  D:  07/08/2003  T:  07/08/2003  Job:  [id number]   cc:   Corwin Levins, M.D. [facility]

## 2010-12-28 ENCOUNTER — Other Ambulatory Visit: Payer: Self-pay | Admitting: Gastroenterology

## 2011-03-04 ENCOUNTER — Ambulatory Visit: Payer: Medicare Other | Admitting: Internal Medicine

## 2011-03-04 DIAGNOSIS — Z029 Encounter for administrative examinations, unspecified: Secondary | ICD-10-CM

## 2011-03-15 DIAGNOSIS — Z0279 Encounter for issue of other medical certificate: Secondary | ICD-10-CM

## 2011-04-15 DIAGNOSIS — Z0279 Encounter for issue of other medical certificate: Secondary | ICD-10-CM

## 2011-06-03 ENCOUNTER — Encounter: Payer: Self-pay | Admitting: Internal Medicine

## 2011-06-03 DIAGNOSIS — Z0000 Encounter for general adult medical examination without abnormal findings: Secondary | ICD-10-CM | POA: Insufficient documentation

## 2011-06-05 ENCOUNTER — Other Ambulatory Visit (INDEPENDENT_AMBULATORY_CARE_PROVIDER_SITE_OTHER): Payer: Medicare Other

## 2011-06-05 ENCOUNTER — Ambulatory Visit (INDEPENDENT_AMBULATORY_CARE_PROVIDER_SITE_OTHER): Payer: Medicare Other | Admitting: Internal Medicine

## 2011-06-05 ENCOUNTER — Encounter: Payer: Self-pay | Admitting: Internal Medicine

## 2011-06-05 ENCOUNTER — Other Ambulatory Visit: Payer: Self-pay | Admitting: Internal Medicine

## 2011-06-05 VITALS — BP 112/78 | HR 82 | Temp 98.0°F | Ht 63.0 in | Wt 116.0 lb

## 2011-06-05 DIAGNOSIS — E1065 Type 1 diabetes mellitus with hyperglycemia: Secondary | ICD-10-CM

## 2011-06-05 DIAGNOSIS — IMO0002 Reserved for concepts with insufficient information to code with codable children: Secondary | ICD-10-CM

## 2011-06-05 DIAGNOSIS — R269 Unspecified abnormalities of gait and mobility: Secondary | ICD-10-CM

## 2011-06-05 DIAGNOSIS — E785 Hyperlipidemia, unspecified: Secondary | ICD-10-CM

## 2011-06-05 DIAGNOSIS — I10 Essential (primary) hypertension: Secondary | ICD-10-CM

## 2011-06-05 LAB — LIPID PANEL
Total CHOL/HDL Ratio: 2
VLDL: 14.8 mg/dL (ref 0.0–40.0)

## 2011-06-05 LAB — BASIC METABOLIC PANEL
Chloride: 104 mEq/L (ref 96–112)
GFR: 40.39 mL/min — ABNORMAL LOW (ref >60.00)
Potassium: 4.3 mEq/L (ref 3.5–5.1)

## 2011-06-05 MED ORDER — GLUCOSE BLOOD VI STRP: ORAL_STRIP | Status: DC

## 2011-06-05 MED ORDER — "SYRINGE/NEEDLE (DISP) 25G X 1"" 3 ML MISC": 1.0000 "application " | Status: DC

## 2011-06-05 NOTE — Patient Instructions (Addendum)
Continue all other medications as before You will be contacted regarding the referral for: Outpatient Physical Therapy Your refills were sent as you requested Please go to LAB in the Basement for the blood and/or urine tests to be done today Please call the phone number [phone] (the PhoneTree System) for results of testing in 2-3 days;  When calling, simply dial the number, and when prompted enter the MRN number above (the Medical Record Number) and the # key, then the message should start. Please return in 6 months, or sooner if needed

## 2011-06-05 NOTE — Assessment & Plan Note (Signed)
Exam little change, but with less stamina it seems by hx, ongoing LBP, gait problem and increased risk of fall - for outpt PT eval adn tx

## 2011-06-05 NOTE — Progress Notes (Signed)
Subjective:    Patient ID: Veronica Copeland, female    DOB: June 05, 1946, 65 y.o.   MRN: [id number]  HPI  Here to f/u; overall doing ok,  Pt denies chest pain, increased sob or doe, wheezing, orthopnea, PND, increased LE swelling, palpitations, dizziness or syncope.  Pt denies new neurological symptoms such as new headache, or facial or extremity weakness or numbness   Pt denies polydipsia, polyuria, or low sugar symptoms such as weakness or confusion improved with po intake.  Pt states overall good compliance with meds, trying to follow lower cholesterol, diabetic diet, wt overall stable but little exercise however.  Has some increased ambulating with her walker as of late in the past few months though overall Pt continues to have recurring LBP without change in severity, bowel or bladder change, fever, wt loss,  worsening LE pain/numbness/weakness, gait change or falls; has chronic persistent LLE weakness since her spine surgury approx 2 yrs ago, with persistent numbness as well. Past Medical History  Diagnosis Date  . CAD (coronary artery disease)     s/p CABG  . HLD (hyperlipidemia)   . HTN (hypertension)   . Acute MI   . Lung nodule   . DM type 1 (diabetes mellitus, type 1)   . Back pain   . Anemia   . Allergic rhinitis   . Osteopenia   . Vaginitis   . Ganglion of joint   . Depression   . DVT (deep venous thrombosis)   . Paraplegia   . Anemia   . GERD (gastroesophageal reflux disease)    Past Surgical History  Procedure Date  . Rotator cuff repair   . Cesarean section   . Coronary artery bypass graft 03/2009  . Laminectomy 2010    reports that she has never smoked. She does not have any smokeless tobacco history on file. She reports that she does not drink alcohol or use illicit drugs. family history includes Coronary artery disease in an unspecified family member; Diabetes in an unspecified family member; Hypertension in an unspecified family member; and Stroke in an unspecified  family member. Allergies  Allergen Reactions  . Penicillins    Current Outpatient Prescriptions on File Prior to Visit  Medication Sig Dispense Refill  . aspirin 81 MG tablet Take 81 mg by mouth daily.        . citalopram (CELEXA) 10 MG tablet Take 10 mg by mouth daily.        . cyanocobalamin (,VITAMIN B-12,) 1000 MCG/ML injection Inject 1,000 mcg into the muscle every 30 (thirty) days.        . cyanocobalamin 1000 MCG tablet Take 100 mcg by mouth daily.        . ferrous fumarate-iron polysaccharide complex (TANDEM) 162-115.2 MG CAPS Take 1 capsule by mouth daily with breakfast.        . fexofenadine (ALLEGRA) 180 MG tablet Take 180 mg by mouth daily.        . folic acid (FOLVITE) 1 MG tablet Take 1 mg by mouth daily.        Marland Kitchen glimepiride (AMARYL) 1 MG tablet Take 0.5 mg by mouth daily before breakfast.        . insulin aspart (NOVOLOG) 100 UNIT/ML injection as directed.        . lansoprazole (PREVACID) 30 MG capsule Take 30 mg by mouth daily.        . metFORMIN (GLUMETZA) 500 MG (MOD) 24 hr tablet Take 1,000 mg by mouth 2 (two)  times daily with a meal.        . nitroGLYCERIN (NITROSTAT) 0.4 MG SL tablet Place 0.4 mg under the tongue every 5 (five) minutes as needed.        . pravastatin (PRAVACHOL) 10 MG tablet Take 1 tablet (10 mg total) by mouth daily.  30 tablet  10  . traMADol (ULTRAM) 50 MG tablet Take 1 tablet (50 mg total) by mouth every 6 (six) hours as needed.  240 tablet  1   Review of Systems Review of Systems  Constitutional: Negative for diaphoresis and unexpected weight change.  HENT: Negative for drooling and tinnitus.   Eyes: Negative for photophobia and visual disturbance.  Respiratory: Negative for choking and stridor.   Gastrointestinal: Negative for vomiting and blood in stool.  Genitourinary: Negative for hematuria and decreased urine volume.     Objective:   Physical Exam BP 112/78  Pulse 82  Temp(Src) 98 F (36.7 C) (Oral)  Ht 5\' 3"  (1.6 m)  Wt 116 lb  (52.617 kg)  BMI 20.55 kg/m2  SpO2 99% Physical Exam  VS noted Constitutional: Pt appears well-developed and well-nourished.  HENT: Head: Normocephalic.  Right Ear: External ear normal.  Left Ear: External ear normal.  Eyes: Conjunctivae and EOM are normal. Pupils are equal, round, and reactive to light.  Neck: Normal range of motion. Neck supple.  Cardiovascular: Normal rate and regular rhythm.   Pulmonary/Chest: Effort normal and breath sounds normal.  Abd:  Soft, NT, non-distended, + BS Neurological: Pt is alert. No cranial nerve deficit. motor 4/5 diffuse LLE, with decrease Sens LT diffusely as well Skin: Skin is warm. No erythema.  Psychiatric: Pt behavior is normal. Thought content normal.     Assessment & Plan:

## 2011-06-05 NOTE — Assessment & Plan Note (Signed)
stable overall by hx and exam, most recent data reviewed with pt, and pt to continue medical treatment as before Lab Results  Component Value Date   LDLCALC 59 09/03/2010

## 2011-06-05 NOTE — Assessment & Plan Note (Signed)
stable overall by hx and exam, most recent data reviewed with pt, and pt to continue medical treatment as before  Lab Results  Component Value Date   HGBA1C 7.8* 09/03/2010   To check labs today

## 2011-06-05 NOTE — Assessment & Plan Note (Signed)
stable overall by hx and exam, most recent data reviewed with pt, and pt to continue medical treatment as before  BP Readings from Last 3 Encounters:  06/05/11 112/78  10/11/10 200/86  09/03/10 110/80

## 2011-06-06 LAB — LDL CHOLESTEROL, DIRECT: Direct LDL: 92.7 mg/dL

## 2011-06-13 ENCOUNTER — Ambulatory Visit: Payer: Medicare Other | Attending: Internal Medicine | Admitting: Physical Therapy

## 2011-06-13 DIAGNOSIS — IMO0001 Reserved for inherently not codable concepts without codable children: Secondary | ICD-10-CM | POA: Insufficient documentation

## 2011-06-13 DIAGNOSIS — Z0279 Encounter for issue of other medical certificate: Secondary | ICD-10-CM

## 2011-06-13 DIAGNOSIS — M6281 Muscle weakness (generalized): Secondary | ICD-10-CM | POA: Insufficient documentation

## 2011-06-13 DIAGNOSIS — R269 Unspecified abnormalities of gait and mobility: Secondary | ICD-10-CM | POA: Insufficient documentation

## 2011-06-17 ENCOUNTER — Ambulatory Visit: Payer: Medicare Other | Admitting: Physical Therapy

## 2011-06-19 ENCOUNTER — Ambulatory Visit: Payer: Medicare Other | Admitting: *Deleted

## 2011-06-24 ENCOUNTER — Ambulatory Visit: Payer: Medicare Other | Admitting: Physical Therapy

## 2011-06-28 ENCOUNTER — Ambulatory Visit: Payer: Medicare Other | Admitting: Physical Therapy

## 2011-07-02 ENCOUNTER — Other Ambulatory Visit: Payer: Self-pay | Admitting: Gastroenterology

## 2011-07-04 ENCOUNTER — Ambulatory Visit: Payer: Medicare Other | Admitting: Physical Therapy

## 2011-07-05 ENCOUNTER — Ambulatory Visit: Payer: Medicare Other | Admitting: Physical Therapy

## 2011-07-11 ENCOUNTER — Ambulatory Visit: Payer: Medicare Other | Attending: Internal Medicine | Admitting: Physical Therapy

## 2011-07-11 DIAGNOSIS — IMO0001 Reserved for inherently not codable concepts without codable children: Secondary | ICD-10-CM | POA: Insufficient documentation

## 2011-07-11 DIAGNOSIS — M6281 Muscle weakness (generalized): Secondary | ICD-10-CM | POA: Insufficient documentation

## 2011-07-11 DIAGNOSIS — R269 Unspecified abnormalities of gait and mobility: Secondary | ICD-10-CM | POA: Insufficient documentation

## 2011-07-12 ENCOUNTER — Ambulatory Visit: Payer: Medicare Other | Admitting: Physical Therapy

## 2011-07-14 ENCOUNTER — Other Ambulatory Visit: Payer: Self-pay | Admitting: Internal Medicine

## 2011-07-16 ENCOUNTER — Ambulatory Visit: Payer: Medicare Other | Admitting: Physical Therapy

## 2011-07-17 ENCOUNTER — Ambulatory Visit: Payer: Medicare Other | Admitting: Physical Therapy

## 2011-07-22 ENCOUNTER — Ambulatory Visit: Payer: Medicare Other | Admitting: Physical Therapy

## 2011-07-24 ENCOUNTER — Ambulatory Visit: Payer: Medicare Other | Admitting: Physical Therapy

## 2011-07-30 ENCOUNTER — Ambulatory Visit: Payer: Medicare Other | Admitting: Physical Therapy

## 2011-08-01 ENCOUNTER — Encounter: Payer: Managed Care, Other (non HMO) | Admitting: Physical Therapy

## 2011-08-02 ENCOUNTER — Ambulatory Visit: Payer: Medicare Other | Admitting: Physical Therapy

## 2011-08-06 ENCOUNTER — Ambulatory Visit: Payer: Medicare Other | Admitting: Physical Therapy

## 2011-08-08 ENCOUNTER — Ambulatory Visit: Payer: Medicare Other | Admitting: Physical Therapy

## 2011-08-12 ENCOUNTER — Ambulatory Visit: Payer: Medicare Other | Attending: Internal Medicine | Admitting: Physical Therapy

## 2011-08-12 DIAGNOSIS — IMO0001 Reserved for inherently not codable concepts without codable children: Secondary | ICD-10-CM | POA: Insufficient documentation

## 2011-08-12 DIAGNOSIS — R269 Unspecified abnormalities of gait and mobility: Secondary | ICD-10-CM | POA: Insufficient documentation

## 2011-08-12 DIAGNOSIS — M6281 Muscle weakness (generalized): Secondary | ICD-10-CM | POA: Insufficient documentation

## 2011-08-15 ENCOUNTER — Ambulatory Visit: Payer: Medicare Other | Admitting: Physical Therapy

## 2011-08-19 ENCOUNTER — Ambulatory Visit: Payer: Medicare Other | Admitting: Physical Therapy

## 2011-08-22 ENCOUNTER — Ambulatory Visit: Payer: Medicare Other | Admitting: Physical Therapy

## 2011-08-26 ENCOUNTER — Ambulatory Visit: Payer: Medicare Other | Admitting: Physical Therapy

## 2011-08-29 ENCOUNTER — Ambulatory Visit: Payer: Medicare Other | Admitting: Physical Therapy

## 2011-09-02 ENCOUNTER — Ambulatory Visit: Payer: Medicare Other | Admitting: Physical Therapy

## 2011-09-05 ENCOUNTER — Ambulatory Visit: Payer: Managed Care, Other (non HMO) | Admitting: Physical Therapy

## 2011-09-06 ENCOUNTER — Ambulatory Visit: Payer: Medicare Other | Attending: Internal Medicine | Admitting: Physical Therapy

## 2011-09-06 DIAGNOSIS — IMO0001 Reserved for inherently not codable concepts without codable children: Secondary | ICD-10-CM | POA: Insufficient documentation

## 2011-09-06 DIAGNOSIS — R269 Unspecified abnormalities of gait and mobility: Secondary | ICD-10-CM | POA: Insufficient documentation

## 2011-09-06 DIAGNOSIS — M6281 Muscle weakness (generalized): Secondary | ICD-10-CM | POA: Insufficient documentation

## 2011-09-09 ENCOUNTER — Ambulatory Visit: Payer: Medicare Other | Admitting: Physical Therapy

## 2011-09-11 ENCOUNTER — Other Ambulatory Visit: Payer: Self-pay | Admitting: Internal Medicine

## 2011-09-11 ENCOUNTER — Ambulatory Visit: Payer: Managed Care, Other (non HMO) | Admitting: Physical Therapy

## 2011-09-13 ENCOUNTER — Other Ambulatory Visit: Payer: Self-pay | Admitting: Internal Medicine

## 2011-10-02 ENCOUNTER — Other Ambulatory Visit: Payer: Self-pay | Admitting: Internal Medicine

## 2011-11-01 ENCOUNTER — Other Ambulatory Visit: Payer: Self-pay | Admitting: Internal Medicine

## 2011-12-03 ENCOUNTER — Ambulatory Visit (INDEPENDENT_AMBULATORY_CARE_PROVIDER_SITE_OTHER): Payer: Medicare Other | Admitting: Internal Medicine

## 2011-12-03 ENCOUNTER — Encounter: Payer: Self-pay | Admitting: Internal Medicine

## 2011-12-03 ENCOUNTER — Other Ambulatory Visit (INDEPENDENT_AMBULATORY_CARE_PROVIDER_SITE_OTHER): Payer: Medicare Other

## 2011-12-03 VITALS — BP 122/82 | HR 86 | Temp 97.6°F | Ht 63.5 in | Wt 127.4 lb

## 2011-12-03 DIAGNOSIS — E785 Hyperlipidemia, unspecified: Secondary | ICD-10-CM

## 2011-12-03 DIAGNOSIS — I10 Essential (primary) hypertension: Secondary | ICD-10-CM

## 2011-12-03 DIAGNOSIS — E1151 Type 2 diabetes mellitus with diabetic peripheral angiopathy without gangrene: Secondary | ICD-10-CM | POA: Insufficient documentation

## 2011-12-03 DIAGNOSIS — R252 Cramp and spasm: Secondary | ICD-10-CM

## 2011-12-03 DIAGNOSIS — J309 Allergic rhinitis, unspecified: Secondary | ICD-10-CM

## 2011-12-03 DIAGNOSIS — IMO0001 Reserved for inherently not codable concepts without codable children: Secondary | ICD-10-CM

## 2011-12-03 DIAGNOSIS — F411 Generalized anxiety disorder: Secondary | ICD-10-CM

## 2011-12-03 LAB — LIPID PANEL
HDL: 103.3 mg/dL (ref >39.00)
Triglycerides: 59 mg/dL (ref 0.0–149.0)

## 2011-12-03 LAB — BASIC METABOLIC PANEL
CO2: 28 mEq/L (ref 19–32)
Calcium: 8.9 mg/dL (ref 8.4–10.5)
Chloride: 107 mEq/L (ref 96–112)
Creatinine, Ser: 1.9 mg/dL — ABNORMAL HIGH (ref 0.4–1.2)
Glucose, Bld: 100 mg/dL — ABNORMAL HIGH (ref 70–99)
Sodium: 142 mEq/L (ref 135–145)

## 2011-12-03 LAB — CBC WITH DIFFERENTIAL/PLATELET
Basophils Absolute: 0.1 10*3/uL (ref 0.0–0.1)
Eosinophils Absolute: 0.2 10*3/uL (ref 0.0–0.7)
Hemoglobin: 11.6 g/dL — ABNORMAL LOW (ref 12.0–15.0)
Lymphocytes Relative: 16.2 % (ref 12.0–46.0)
MCHC: 31.4 g/dL (ref 30.0–36.0)
Monocytes Relative: 5.3 % (ref 3.0–12.0)
Neutro Abs: 7.1 10*3/uL (ref 1.4–7.7)
Neutrophils Relative %: 74.8 % (ref 43.0–77.0)
RDW: 14.8 % — ABNORMAL HIGH (ref 11.5–14.6)

## 2011-12-03 LAB — HEPATIC FUNCTION PANEL
ALT: 14 U/L (ref 0–35)
Albumin: 3.4 g/dL — ABNORMAL LOW (ref 3.5–5.2)
Alkaline Phosphatase: 88 U/L (ref 39–117)
Total Protein: 7.8 g/dL (ref 6.0–8.3)

## 2011-12-03 LAB — HEMOGLOBIN A1C: Hgb A1c MFr Bld: 7.5 % — ABNORMAL HIGH (ref 4.6–6.5)

## 2011-12-03 LAB — TSH: TSH: 1.42 u[IU]/mL (ref 0.35–5.50)

## 2011-12-03 MED ORDER — FLUTICASONE PROPIONATE 50 MCG/ACT NA SUSP: 2.0000 | Freq: Every day | NASAL | Status: DC

## 2011-12-03 MED ORDER — CYCLOBENZAPRINE HCL 5 MG PO TABS: 5.0000 mg | ORAL_TABLET | Freq: Three times a day (TID) | ORAL | Status: AC | PRN

## 2011-12-03 NOTE — Assessment & Plan Note (Signed)
stable overall by hx and exam, and pt to continue medical treatment as before - no longer needs the SSRI,  to f/u any worsening symptoms or concerns

## 2011-12-03 NOTE — Assessment & Plan Note (Signed)
Mild but overall stable overall by hx and exam, t, and pt to start flonase asd, cont the allegra prn, declines depomedrol IM today

## 2011-12-03 NOTE — Patient Instructions (Addendum)
Take all new medications as prescribed - the muscle relaxer, and the generic nasal spray for allergies (flonase) Continue all other medications as before Please have the pharmacy call with any refills you may need. Please continue your excercises as you do at home Please go to LAB in the Basement for the blood and/or urine tests to be done today You will be contacted by phone if any changes need to be made immediately.  Otherwise, you will receive a letter about your results with an explanation. Please return in 6 months, or sooner if needed

## 2011-12-03 NOTE — Assessment & Plan Note (Signed)
stable overall by hx and exam, most recent data reviewed with pt, and pt to continue medical treatment as before BP Readings from Last 3 Encounters:  12/03/11 122/82  06/05/11 112/78  10/11/10 200/86

## 2011-12-03 NOTE — Assessment & Plan Note (Signed)
stable overall by hx and exam, most recent data reviewed with pt, and pt to continue medical treatment as before, for labs today Lab Results  Component Value Date   HGBA1C 7.1* 06/05/2011

## 2011-12-03 NOTE — Assessment & Plan Note (Signed)
Ok for flexeril prn,  to f/u any worsening symptoms or concerns 

## 2011-12-03 NOTE — Progress Notes (Signed)
Subjective:    Patient ID: Veronica Copeland, female    DOB: 09-05-1945, 66 y.o.   MRN: [id number]  HPI  Here to f/u; overall doing ok,  Pt denies chest pain, increased sob or doe, wheezing, orthopnea, PND, increased LE swelling, palpitations, dizziness or syncope.  Pt denies new neurological symptoms such as new headache, or facial or extremity weakness or numbness   Pt denies polydipsia, polyuria, or low sugar symptoms such as weakness or confusion improved with po intake.  Pt states overall good compliance with meds, trying to follow lower cholesterol, diabetic diet, wt overall stable but little exercise however.  Not taking the metformin, but not clear to me why.  Denies worsening depressive symptoms, suicidal ideation, or panic, though has ongoing anxiety, not increased recently, and has not been taking the SSRI as well. Has had intermittent cramps mostly randomly to the left calf, none today.  Walks with walker now, has AFO for LLE, and finished formal PT last month, now working on the excercises on her own.  No recent worsening other pain or falls.  Last saw card apr 2012, Dr Riley Kill, with f/u planned at 2 yrs.  Does have several wks ongoing nasal allergy symptoms with clear congestion, itch and sneeze, without fever, pain, ST, cough or wheezing. Past Medical History  Diagnosis Date  . CAD (coronary artery disease)     s/p CABG  . HLD (hyperlipidemia)   . HTN (hypertension)   . Acute MI   . Lung nodule   . DM type 1 (diabetes mellitus, type 1)   . Back pain   . Anemia   . Allergic rhinitis   . Osteopenia   . Vaginitis   . Ganglion of joint   . Depression   . DVT (deep venous thrombosis)   . Paraplegia   . Anemia   . GERD (gastroesophageal reflux disease)    Past Surgical History  Procedure Date  . Rotator cuff repair   . Cesarean section   . Coronary artery bypass graft 03/2009  . Laminectomy 2010    reports that she has never smoked. She does not have any smokeless tobacco  history on file. She reports that she does not drink alcohol or use illicit drugs. family history includes Coronary artery disease in an unspecified family member; Diabetes in an unspecified family member; Hypertension in an unspecified family member; and Stroke in an unspecified family member. Allergies  Allergen Reactions  . Penicillins    Current Outpatient Prescriptions on File Prior to Visit  Medication Sig Dispense Refill  . aspirin 81 MG tablet Take 81 mg by mouth daily.        Marland Kitchen glimepiride (AMARYL) 1 MG tablet TAKE 1/2 TABLET BY MOUTH DAILY  45 tablet  3  . glucose blood (ONE TOUCH TEST STRIPS) test strip Use as instructed  100 each  5  . insulin aspart (NOVOLOG) 100 UNIT/ML injection as directed.        . nitroGLYCERIN (NITROSTAT) 0.4 MG SL tablet Place 0.4 mg under the tongue every 5 (five) minutes as needed.        . pravastatin (PRAVACHOL) 10 MG tablet TAKE 1 TABLET BY MOUTH EVERY DAY  30 tablet  8  . SYRINGE-NEEDLE, DISP, 3 ML (TERUMO SURGUARD2 SYRINGE) 25G X 1" 3 ML MISC Inject 1 application into the skin as directed.  100 each  5  . [facility] ALLERGY RELIEF 180 MG tablet TAKE 1 TABLET BY MOUTH EVERY DAY  90 tablet  4  . cyanocobalamin (,VITAMIN B-12,) 1000 MCG/ML injection Inject 1,000 mcg into the muscle every 30 (thirty) days.        . cyanocobalamin 1000 MCG tablet Take 100 mcg by mouth daily.        . ferrous fumarate-iron polysaccharide complex (TANDEM) 162-115.2 MG CAPS Take 1 capsule by mouth daily with breakfast.        . fluticasone (FLONASE) 50 MCG/ACT nasal spray Place 2 sprays into the nose daily.  16 g  2  . folic acid (FOLVITE) 1 MG tablet Take 1 mg by mouth daily.        . lansoprazole (PREVACID) 30 MG capsule Take 30 mg by mouth daily.        . traMADol (ULTRAM) 50 MG tablet TAKE 1 TABLET BY MOUTH EVERY 6 HOURS AS NEEDED  240 tablet  1   Review of Systems Review of Systems  Constitutional: Negative for diaphoresis and unexpected weight change.  HENT: Negative for  drooling and tinnitus.   Eyes: Negative for photophobia and visual disturbance.  Respiratory: Negative for choking and stridor.   Gastrointestinal: Negative for vomiting and blood in stool.  Genitourinary: Negative for hematuria and decreased urine volume.  Skin: Negative for color change and wound.  Neurological: Negative for tremors and numbness.  Psychiatric/Behavioral: Negative for decreased concentration. The patient is not hyperactive.      Objective:   Physical Exam BP 122/82  Pulse 86  Temp(Src) 97.6 F (36.4 C) (Oral)  Ht 5' 3.5" (1.613 m)  Wt 127 lb 6 oz (57.777 kg)  BMI 22.21 kg/m2  SpO2 98% Physical Exam  VS noted Constitutional: Pt appears well-developed and well-nourished.  HENT: Head: Normocephalic.  Right Ear: External ear normal.  Left Ear: External ear normal.  Eyes: Conjunctivae and EOM are normal. Pupils are equal, round, and reactive to light.  Neck: Normal range of motion. Neck supple.  Cardiovascular: Normal rate and regular rhythm.   Pulmonary/Chest: Effort normal and breath sounds normal.  Abd:  Soft, NT, non-distended, + BS Neurological: Pt is alert. Walks with walker, has chronic LLE weakness/sens defecit no change  Skin: Skin is warm. No erythema. No rash Psychiatric: Pt behavior is normal. Thought content normal. Not depressed affect or nervous today    Assessment & Plan:

## 2011-12-03 NOTE — Assessment & Plan Note (Signed)
stable overall by hx and exam, most recent data reviewed with pt, and pt to continue medical treatment as before Lab Results  Component Value Date   LDLCALC 59 09/03/2010    

## 2012-01-10 ENCOUNTER — Other Ambulatory Visit: Payer: Self-pay | Admitting: Internal Medicine

## 2012-02-08 ENCOUNTER — Other Ambulatory Visit: Payer: Self-pay | Admitting: Internal Medicine

## 2012-06-03 ENCOUNTER — Other Ambulatory Visit: Payer: Self-pay | Admitting: Internal Medicine

## 2012-06-10 ENCOUNTER — Ambulatory Visit: Payer: Medicare Other | Admitting: Internal Medicine

## 2012-06-10 DIAGNOSIS — Z0289 Encounter for other administrative examinations: Secondary | ICD-10-CM

## 2012-06-15 ENCOUNTER — Other Ambulatory Visit: Payer: Self-pay | Admitting: Internal Medicine

## 2012-06-15 MED ORDER — INSULIN ASPART 100 UNIT/ML ~~LOC~~ SOLN: SUBCUTANEOUS | Status: DC

## 2012-06-15 MED ORDER — "SYRINGE/NEEDLE (DISP) 25G X 1"" 3 ML MISC": 1.0000 "application " | Status: AC

## 2012-06-15 NOTE — Telephone Encounter (Signed)
Patient calling to request refill of her novolog 100u/ml and syringes.  States [facility] went un[facility], according to her pharmacist.  Per Epic, last Rx written in May 2013; patient missed her follow up appt 06/10/12.  Caller transferred to office to schedule follow up appt; info to office for bridge refill, as patient is completely out of insulin.  krs/can

## 2012-06-24 ENCOUNTER — Other Ambulatory Visit: Payer: Self-pay | Admitting: Internal Medicine

## 2012-06-24 ENCOUNTER — Other Ambulatory Visit (INDEPENDENT_AMBULATORY_CARE_PROVIDER_SITE_OTHER): Payer: Medicare Other

## 2012-06-24 ENCOUNTER — Ambulatory Visit (INDEPENDENT_AMBULATORY_CARE_PROVIDER_SITE_OTHER): Payer: Medicare Other | Admitting: Internal Medicine

## 2012-06-24 ENCOUNTER — Encounter: Payer: Self-pay | Admitting: Internal Medicine

## 2012-06-24 VITALS — BP 112/82 | HR 90 | Temp 97.8°F | Ht 63.5 in | Wt 125.0 lb

## 2012-06-24 DIAGNOSIS — N189 Chronic kidney disease, unspecified: Secondary | ICD-10-CM | POA: Insufficient documentation

## 2012-06-24 DIAGNOSIS — I10 Essential (primary) hypertension: Secondary | ICD-10-CM

## 2012-06-24 DIAGNOSIS — R109 Unspecified abdominal pain: Secondary | ICD-10-CM

## 2012-06-24 DIAGNOSIS — E785 Hyperlipidemia, unspecified: Secondary | ICD-10-CM

## 2012-06-24 DIAGNOSIS — N289 Disorder of kidney and ureter, unspecified: Secondary | ICD-10-CM

## 2012-06-24 DIAGNOSIS — IMO0001 Reserved for inherently not codable concepts without codable children: Secondary | ICD-10-CM

## 2012-06-24 DIAGNOSIS — M79609 Pain in unspecified limb: Secondary | ICD-10-CM

## 2012-06-24 DIAGNOSIS — M79671 Pain in right foot: Secondary | ICD-10-CM | POA: Insufficient documentation

## 2012-06-24 LAB — LIPID PANEL
Cholesterol: 152 mg/dL (ref 0–200)
LDL Cholesterol: 67 mg/dL (ref 0–99)
Triglycerides: 59 mg/dL (ref 0.0–149.0)

## 2012-06-24 LAB — HEPATIC FUNCTION PANEL
ALT: 24 U/L (ref 0–35)
AST: 24 U/L (ref 0–37)
Albumin: 3.5 g/dL (ref 3.5–5.2)
Total Bilirubin: 0.6 mg/dL (ref 0.3–1.2)
Total Protein: 8 g/dL (ref 6.0–8.3)

## 2012-06-24 LAB — BASIC METABOLIC PANEL
BUN: 35 mg/dL — ABNORMAL HIGH (ref 6–23)
CO2: 28 mEq/L (ref 19–32)
Chloride: 108 mEq/L (ref 96–112)
Creatinine, Ser: 2.2 mg/dL — ABNORMAL HIGH (ref 0.4–1.2)
Glucose, Bld: 103 mg/dL — ABNORMAL HIGH (ref 70–99)
Potassium: 4.9 mEq/L (ref 3.5–5.1)

## 2012-06-24 LAB — HEMOGLOBIN A1C: Hgb A1c MFr Bld: 7.2 % — ABNORMAL HIGH (ref 4.6–6.5)

## 2012-06-24 NOTE — Patient Instructions (Addendum)
Continue all other medications as before Your refills were done as requested Please go to LAB in the Basement for the blood and/or urine tests to be done today You will be contacted by phone if any changes need to be made immediately.  Otherwise, you will receive a letter about your results with an explanation, but please check with MyChart first. You are otherwise up to date with prevention measures, since you are sure your last colonscopy was less than 10 yrs Thank you for enrolling in MyChart. Please follow the instructions below to securely access your online medical record. MyChart allows you to send messages to your doctor, view your test results, renew your prescriptions, schedule appointments, and more. To Log into MyChart, please go to [email or web address], and your Username is: [phone] You will be contacted regarding the referral for: podiatry Please return in 6 months, or sooner if needed

## 2012-06-27 ENCOUNTER — Encounter: Payer: Self-pay | Admitting: Internal Medicine

## 2012-06-27 NOTE — Progress Notes (Signed)
Subjective:    Patient ID: Veronica Copeland, female    DOB: 05/11/1946, 66 y.o.   MRN: [id number]  HPI  Here to f/u; overall doing ok,  Pt denies chest pain, increased sob or doe, wheezing, orthopnea, PND, increased LE swelling, palpitations, dizziness or syncope.  Pt denies new neurological symptoms such as new headache, or facial or extremity weakness or numbness   Pt denies polydipsia, polyuria, or low sugar symptoms such as weakness or confusion improved with po intake.  Pt states overall good compliance with meds, trying to follow lower cholesterol, diabetic diet, wt overall stable but little exercise however. Volume stable,but also with right foot pain, mid foot with burning electrical feeling between first and second toes, worse to walk, better to sit, overall mild to mod for several wks Past Medical History  Diagnosis Date  . CAD (coronary artery disease)     s/p CABG  . HLD (hyperlipidemia)   . HTN (hypertension)   . Acute MI   . Lung nodule   . DM type 1 (diabetes mellitus, type 1)   . Back pain   . Anemia   . Allergic rhinitis   . Osteopenia   . Vaginitis   . Ganglion of joint   . Depression   . DVT (deep venous thrombosis)   . Paraplegia   . Anemia   . GERD (gastroesophageal reflux disease)   . Type II or unspecified type diabetes mellitus without mention of complication, uncontrolled 12/03/2011   Past Surgical History  Procedure Date  . Rotator cuff repair   . Cesarean section   . Coronary artery bypass graft 03/2009  . Laminectomy 2010    reports that she has never smoked. She does not have any smokeless tobacco history on file. She reports that she does not drink alcohol or use illicit drugs. family history includes Coronary artery disease in an unspecified family member; Diabetes in an unspecified family member; Hypertension in an unspecified family member; and Stroke in an unspecified family member. Allergies  Allergen Reactions  . Penicillins    Current  Outpatient Prescriptions on File Prior to Visit  Medication Sig Dispense Refill  . aspirin 81 MG tablet Take 81 mg by mouth daily.        . B-D TB SYRINGE 1CC/25GX5/8" 25G X 5/8" 1 ML MISC USE WITH B12 INJECTIONS  100 each  0  . [facility] ALLERGY RELIEF 180 MG tablet TAKE 1 TABLET BY MOUTH EVERY DAY  90 tablet  4  . cyanocobalamin (,VITAMIN B-12,) 1000 MCG/ML injection Inject 1,000 mcg into the muscle every 30 (thirty) days.        . cyanocobalamin 1000 MCG tablet Take 100 mcg by mouth daily.        . ferrous fumarate-iron polysaccharide complex (TANDEM) 162-115.2 MG CAPS Take 1 capsule by mouth daily with breakfast.        . fluticasone (FLONASE) 50 MCG/ACT nasal spray Place 2 sprays into the nose daily.  16 g  2  . folic acid (FOLVITE) 1 MG tablet Take 1 mg by mouth daily.        Marland Kitchen glimepiride (AMARYL) 1 MG tablet TAKE 1/2 TABLET BY MOUTH DAILY  45 tablet  3  . glucose blood (ONE TOUCH TEST STRIPS) test strip Use as instructed  100 each  5  . insulin aspart (NOVOLOG) 100 UNIT/ML injection Use as directed. Diagnosis code 250.02  10 mL  1  . lansoprazole (PREVACID) 30 MG capsule Take 30 mg  by mouth daily.        . nitroGLYCERIN (NITROSTAT) 0.4 MG SL tablet Place 0.4 mg under the tongue every 5 (five) minutes as needed.        . pravastatin (PRAVACHOL) 10 MG tablet TAKE 1 TABLET BY MOUTH EVERY DAY  30 tablet  8  . SYRINGE-NEEDLE, DISP, 3 ML (TERUMO SURGUARD2 SYRINGE) 25G X 1" 3 ML MISC Inject 1 application into the skin as directed.  100 each  1  . traMADol (ULTRAM) 50 MG tablet TAKE 1 TABLET BY MOUTH EVERY 6 HOURS AS NEEDED  240 tablet  1   Review of Systems  Constitutional: Negative for diaphoresis and unexpected weight change.  HENT: Negative for tinnitus.   Eyes: Negative for photophobia and visual disturbance.  Respiratory: Negative for choking and stridor.   Gastrointestinal: Negative for vomiting and blood in stool.  Genitourinary: Negative for hematuria and decreased urine volume.   Musculoskeletal: Negative for gait problem.  Skin: Negative for color change and wound.  Neurological: Negative for tremors and numbness.  Psychiatric/Behavioral: Negative for decreased concentration. The patient is not hyperactive.       Objective:   Physical Exam BP 112/82  Pulse 90  Temp 97.8 F (36.6 C) (Oral)  Ht 5' 3.5" (1.613 m)  Wt 125 lb (56.7 kg)  BMI 21.80 kg/m2  SpO2 95% Physical Exam  VS noted Constitutional: Pt appears well-developed and well-nourished.  HENT: Head: Normocephalic.  Right Ear: External ear normal.  Left Ear: External ear normal.  Eyes: Conjunctivae and EOM are normal. Pupils are equal, round, and reactive to light.  Neck: Normal range of motion. Neck supple.  Cardiovascular: Normal rate and regular rhythm.   Pulmonary/Chest: Effort normal and breath sounds normal.  Neurological: Pt is alert. Not confused  Skin: Skin is warm. No erythema. Right foot neurovasc intact, no skin rash or swelling, dorsalis pedis 1+ bilat Psychiatric: Pt behavior is normal. Thought content normal.     Assessment & Plan:

## 2012-06-27 NOTE — Assessment & Plan Note (Signed)
stable overall by hx and exam, most recent data reviewed with pt, and pt to continue medical treatment as before BP Readings from Last 3 Encounters:  06/24/12 112/82  12/03/11 122/82  06/05/11 112/78

## 2012-06-27 NOTE — Assessment & Plan Note (Signed)
stable overall by hx and exam, most recent data reviewed with pt, and pt to continue medical treatment as before Lab Results  Component Value Date   HGBA1C 7.2* 06/24/2012

## 2012-06-27 NOTE — Assessment & Plan Note (Signed)
Volume stable, for bmet today,  to f/u any worsening symptoms or concerns

## 2012-06-27 NOTE — Assessment & Plan Note (Signed)
Exam benign, ? Neuritic pain - for podiatry referral

## 2012-06-27 NOTE — Assessment & Plan Note (Signed)
stable overall by hx and exam, most recent data reviewed with pt, and pt to continue medical treatment as before Lab Results  Component Value Date   LDLCALC 67 06/24/2012

## 2012-07-03 ENCOUNTER — Ambulatory Visit
Admission: RE | Admit: 2012-07-03 | Discharge: 2012-07-03 | Disposition: A | Discharge status: Home / Self Care | Payer: Medicare Other | Source: Ambulatory Visit | Attending: Internal Medicine | Admitting: Internal Medicine

## 2012-07-03 DIAGNOSIS — R109 Unspecified abdominal pain: Secondary | ICD-10-CM

## 2012-07-03 DIAGNOSIS — N289 Disorder of kidney and ureter, unspecified: Secondary | ICD-10-CM

## 2012-08-05 ENCOUNTER — Other Ambulatory Visit: Payer: Self-pay | Admitting: Internal Medicine

## 2012-08-09 ENCOUNTER — Other Ambulatory Visit: Payer: Self-pay | Admitting: Internal Medicine

## 2012-08-14 DIAGNOSIS — I739 Peripheral vascular disease, unspecified: Secondary | ICD-10-CM

## 2012-08-14 HISTORY — DX: Peripheral vascular disease, unspecified: I73.9

## 2012-08-25 ENCOUNTER — Other Ambulatory Visit: Payer: Self-pay | Admitting: Internal Medicine

## 2012-09-07 ENCOUNTER — Encounter: Payer: Self-pay | Admitting: Internal Medicine

## 2012-09-09 ENCOUNTER — Other Ambulatory Visit: Payer: Self-pay | Admitting: Internal Medicine

## 2012-09-19 ENCOUNTER — Other Ambulatory Visit: Payer: Self-pay | Admitting: Internal Medicine

## 2012-10-11 ENCOUNTER — Encounter: Payer: Self-pay | Admitting: *Deleted

## 2012-11-10 ENCOUNTER — Other Ambulatory Visit: Payer: Self-pay | Admitting: Internal Medicine

## 2012-11-17 ENCOUNTER — Other Ambulatory Visit (HOSPITAL_COMMUNITY): Payer: Self-pay | Admitting: Cardiovascular Disease

## 2012-11-17 DIAGNOSIS — I70219 Atherosclerosis of native arteries of extremities with intermittent claudication, unspecified extremity: Secondary | ICD-10-CM

## 2012-11-20 ENCOUNTER — Other Ambulatory Visit: Payer: Self-pay | Admitting: Internal Medicine

## 2012-12-03 ENCOUNTER — Encounter (HOSPITAL_COMMUNITY): Payer: Medicare Other

## 2012-12-04 ENCOUNTER — Telehealth: Payer: Self-pay

## 2012-12-04 NOTE — Telephone Encounter (Signed)
Please advise change to 1 day per wk/4 days per month

## 2012-12-04 NOTE — Telephone Encounter (Signed)
Called left detailed message of change.  Did request a call back to confirm received message.  Called [phone] Veronica Copeland.

## 2012-12-04 NOTE — Telephone Encounter (Signed)
Please advise on recent FMLA [employer] stated patients son Laqueena Hinchey) needs to be off 8 days per week and 4 days per month.  Please clarify.  Call back number Suzy at [phone] at [employer] [facility] Department

## 2012-12-07 ENCOUNTER — Inpatient Hospital Stay (HOSPITAL_COMMUNITY): Admission: RE | Admit: 2012-12-07 | Payer: Medicare Other | Source: Ambulatory Visit

## 2012-12-07 NOTE — Telephone Encounter (Signed)
Called left detailed message of MD instructions. 

## 2012-12-08 ENCOUNTER — Other Ambulatory Visit (HOSPITAL_COMMUNITY): Payer: Self-pay | Admitting: Cardiovascular Disease

## 2012-12-08 DIAGNOSIS — I70219 Atherosclerosis of native arteries of extremities with intermittent claudication, unspecified extremity: Secondary | ICD-10-CM

## 2012-12-18 ENCOUNTER — Ambulatory Visit (HOSPITAL_COMMUNITY)
Admission: RE | Admit: 2012-12-18 | Discharge: 2012-12-18 | Disposition: A | Discharge status: Home / Self Care | Payer: Medicare Other | Source: Ambulatory Visit | Attending: Cardiovascular Disease | Admitting: Cardiovascular Disease

## 2012-12-18 DIAGNOSIS — I70219 Atherosclerosis of native arteries of extremities with intermittent claudication, unspecified extremity: Secondary | ICD-10-CM | POA: Insufficient documentation

## 2012-12-18 NOTE — Progress Notes (Signed)
Arterial Duplex Completed. Veronica Copeland  

## 2013-01-14 ENCOUNTER — Encounter: Payer: Self-pay | Admitting: Internal Medicine

## 2013-01-14 ENCOUNTER — Ambulatory Visit (INDEPENDENT_AMBULATORY_CARE_PROVIDER_SITE_OTHER): Payer: Medicare Other | Admitting: Internal Medicine

## 2013-01-14 VITALS — BP 182/90 | HR 104 | Temp 97.4°F | Ht 63.5 in | Wt 120.1 lb

## 2013-01-14 DIAGNOSIS — Z0000 Encounter for general adult medical examination without abnormal findings: Secondary | ICD-10-CM

## 2013-01-14 DIAGNOSIS — I10 Essential (primary) hypertension: Secondary | ICD-10-CM

## 2013-01-14 DIAGNOSIS — Z23 Encounter for immunization: Secondary | ICD-10-CM

## 2013-01-14 DIAGNOSIS — E785 Hyperlipidemia, unspecified: Secondary | ICD-10-CM

## 2013-01-14 DIAGNOSIS — J309 Allergic rhinitis, unspecified: Secondary | ICD-10-CM

## 2013-01-14 MED ORDER — FLUTICASONE PROPIONATE 50 MCG/ACT NA SUSP: 2.0000 | Freq: Every day | NASAL | Status: DC

## 2013-01-14 MED ORDER — CETIRIZINE HCL 10 MG PO TABS: 10.0000 mg | ORAL_TABLET | Freq: Every day | ORAL | Status: DC

## 2013-01-14 NOTE — Assessment & Plan Note (Signed)
stable overall by history and exam, recent data reviewed with pt, and pt to continue medical treatment as before,  to f/u any worsening symptoms or concerns Lab Results  Component Value Date   WBC 9.4 12/03/2011   HGB 11.6* 12/03/2011   HCT 36.9 12/03/2011   PLT 177.0 12/03/2011   GLUCOSE 103* 06/24/2012   CHOL 152 06/24/2012   TRIG 59.0 06/24/2012   HDL 72.80 06/24/2012   LDLDIRECT 92.7 06/05/2011   LDLCALC 67 06/24/2012   ALT 24 06/24/2012   AST 24 06/24/2012   NA 142 06/24/2012   K 4.9 06/24/2012   CL 108 06/24/2012   CREATININE 2.2* 06/24/2012   BUN 35* 06/24/2012   CO2 28 06/24/2012   TSH 1.42 12/03/2011   INR 1.1 03/29/2009   HGBA1C 7.2* 06/24/2012   MICROALBUR 39.0* 08/10/2009   For change allegra to zyrtec, start flonase asd,  to f/u any worsening symptoms or concerns, declines depomedrol IM now

## 2013-01-14 NOTE — Assessment & Plan Note (Signed)
stable overall by history and exam, recent data reviewed with pt, and pt to continue medical treatment as before,  to f/u any worsening symptoms or concerns Lab Results  Component Value Date   HGBA1C 7.2* 06/24/2012   For further a1c at [facility] with renal labs next wk
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
## 2013-01-14 NOTE — Addendum Note (Signed)
Addended by: Scharlene Gloss B on: 01/14/2013 09:37 AM   Modules accepted: Orders

## 2013-01-14 NOTE — Progress Notes (Addendum)
Subjective:    Patient ID: Veronica Copeland, female    DOB: July 13, 1945, 67 y.o.   MRN: [id number]  HPI  Here to f/u; overall doing ok,  Pt denies chest pain, increased sob or doe, wheezing, orthopnea, PND, increased LE swelling, palpitations, dizziness or syncope.  Pt denies polydipsia, polyuria, or low sugar symptoms such as weakness or confusion improved with po intake.  Pt denies new neurological symptoms such as new headache, or facial or extremity weakness or numbness.   Pt states overall good compliance with meds, has been trying to follow lower cholesterol, diabetic diet, with wt overall stable,  but little exercise however.  Due for lab work per renal next wk at [facility].  Due for mammogram, and pneumovax, and colon screening. Does have several wks ongoing nasal allergy symptoms with clearish congestion, itch and sneezing, without fever, pain, ST, cough, swelling or wheezing. Past Medical History  Diagnosis Date  . CAD (coronary artery disease)     s/p CABG  . HLD (hyperlipidemia)   . HTN (hypertension)   . Acute MI   . Lung nodule   . DM type 1 (diabetes mellitus, type 1)   . Back pain   . Anemia   . Allergic rhinitis   . Osteopenia   . Vaginitis   . Ganglion of joint   . Depression   . DVT (deep venous thrombosis)   . Paraplegia   . Anemia   . GERD (gastroesophageal reflux disease)   . Type II or unspecified type diabetes mellitus without mention of complication, uncontrolled 12/03/2011  . Peripheral vascular disease 08/14/12    cancellrd scheduled appointment with Dr. Nanetta Batty   Past Surgical History  Procedure Laterality Date  . Rotator cuff repair    . Cesarean section    . Coronary artery bypass graft  03/2009  . Laminectomy  2010    reports that she has never smoked. She does not have any smokeless tobacco history on file. She reports that she does not drink alcohol or use illicit drugs. family history includes Coronary artery disease in an unspecified family  member; Diabetes in an unspecified family member; Hypertension in an unspecified family member; and Stroke in an unspecified family member. Allergies  Allergen Reactions  . Penicillins    Current Outpatient Prescriptions on File Prior to Visit  Medication Sig Dispense Refill  . aspirin 81 MG tablet Take 81 mg by mouth daily.        . B-D TB SYRINGE 1CC/25GX5/8" 25G X 5/8" 1 ML MISC USE WITH B12 INJECTIONS  100 each  0  . citalopram (CELEXA) 10 MG tablet TAKE 1 TABLET BY MOUTH ONCE DAILY  90 tablet  3  . citalopram (CELEXA) 10 MG tablet TAKE 1 TABLET BY MOUTH ONCE DAILY  90 tablet  3  . cyanocobalamin (,VITAMIN B-12,) 1000 MCG/ML injection Inject 1,000 mcg into the muscle every 30 (thirty) days.        . cyanocobalamin 1000 MCG tablet Take 100 mcg by mouth daily.        . ferrous fumarate-iron polysaccharide complex (TANDEM) 162-115.2 MG CAPS Take 1 capsule by mouth daily with breakfast.        . fexofenadine (ALLEGRA) 180 MG tablet TAKE 1 TABLET BY MOUTH EVERY DAY  90 tablet  1  . folic acid (FOLVITE) 1 MG tablet Take 1 mg by mouth daily.        Marland Kitchen glimepiride (AMARYL) 1 MG tablet TAKE 1/2 TABLET BY MOUTH  DAILY  45 tablet  3  . glucose blood (ONE TOUCH TEST STRIPS) test strip Use as instructed  100 each  5  . lansoprazole (PREVACID) 30 MG capsule Take 30 mg by mouth daily.        . nitroGLYCERIN (NITROSTAT) 0.4 MG SL tablet Place 0.4 mg under the tongue every 5 (five) minutes as needed.        Marland Kitchen NOVOLOG 100 UNIT/ML injection USE AS DIRECTED  10 mL  11  . pravastatin (PRAVACHOL) 10 MG tablet TAKE 1 TABLET BY MOUTH EVERY DAY  30 tablet  8  . SYRINGE-NEEDLE, DISP, 3 ML (TERUMO SURGUARD2 SYRINGE) 25G X 1" 3 ML MISC Inject 1 application into the skin as directed.  100 each  1  . traMADol (ULTRAM) 50 MG tablet TAKE 1 TABLET BY MOUTH EVERY 6 HOURS AS NEEDED  240 tablet  1  . fluticasone (FLONASE) 50 MCG/ACT nasal spray Place 2 sprays into the nose daily.  16 g  2   No current facility-administered  medications on file prior to visit.   Review of Systems  Constitutional: Negative for unexpected weight change, or unusual diaphoresis  HENT: Negative for tinnitus.   Eyes: Negative for photophobia and visual disturbance.  Respiratory: Negative for choking and stridor.   Gastrointestinal: Negative for vomiting and blood in stool.  Genitourinary: Negative for hematuria and decreased urine volume.  Musculoskeletal: Negative for acute joint swelling Skin: Negative for color change and wound.  Neurological: Negative for tremors and numbness other than noted  Psychiatric/Behavioral: Negative for decreased concentration or  hyperactivity.       Objective:   Physical Exam BP 182/90  Pulse 104  Temp(Src) 97.4 F (36.3 C) (Oral)  Ht 5' 3.5" (1.613 m)  Wt 120 lb 2 oz (54.488 kg)  BMI 20.94 kg/m2  SpO2 98% VS noted, not ill appearing Constitutional: Pt appears well-developed and well-nourished.  HENT: Head: NCAT.  Right Ear: External ear normal.  Left Ear: External ear normal.  Bilat tm's with mild erythema.  Max sinus areas non tender.  Pharynx with mild erythema, no exudate Eyes: Conjunctivae with mild erythema, weepiness,and EOM are normal. Pupils are equal, round, and reactive to light.  Neck: Normal range of motion. Neck supple.  Cardiovascular: Normal rate and regular rhythm.   Pulmonary/Chest: Effort normal and breath sounds normal.  Abd:  Soft, NT, non-distended, + BS Neurological: Pt is alert. Not confused motor 5/5 except 3+/5 LLE (chronic persistent, walks with walker) Skin: Skin is warm. No erythema.  Psychiatric: Pt behavior is normal. Thought content normal.     Assessment & Plan:  Quality Measures addressed:  Mammogram:  pt declines and will self-refer  Diabetes LDL < 100: pt declines further medication Diabetes Hgba1c < 8%: pt declines further medication Diabetes Aspirin use: pt declines further medication  CAD  - drug therapy for lower cholesterol: pt declines  further medication ACE/ARB therapy for CAD, Diabetes, and/or LVSD: pt declines further medication

## 2013-01-14 NOTE — Patient Instructions (Addendum)
You had the pneumonia shot today Please remember to followup with your GYN for the yearly pap smear and/or mammogram- to consider [facility] on [address], or [facility] on [address] Please take the order from me to have the lab work done next week Please take all new medication as prescribed - the zyrtec and flonase You will be contacted regarding the referral for: Gastroenterology (for purpose of follow up colon screening) Please keep your appointments with your specialists as you have planned - renal  Please remember to sign up for My Chart if you have not done so, as this will be important to you in the future with finding out test results, communicating by private email, and scheduling acute appointments online when needed.  Please return in 6 months, or sooner if needed

## 2013-01-14 NOTE — Assessment & Plan Note (Signed)
stable overall by history and exam, recent data reviewed with pt, and pt to continue medical treatment as before,  to f/u any worsening symptoms or concerns Lab Results  Component Value Date   LDLCALC 67 06/24/2012

## 2013-01-22 ENCOUNTER — Encounter: Payer: Self-pay | Admitting: Gastroenterology

## 2013-01-28 ENCOUNTER — Encounter: Payer: Self-pay | Admitting: Internal Medicine

## 2013-02-10 ENCOUNTER — Other Ambulatory Visit: Payer: Self-pay

## 2013-03-03 ENCOUNTER — Encounter: Payer: Medicare Other | Admitting: Gastroenterology

## 2013-04-13 ENCOUNTER — Telehealth: Payer: Self-pay | Admitting: Internal Medicine

## 2013-04-13 DIAGNOSIS — N6310 Unspecified lump in the right breast, unspecified quadrant: Secondary | ICD-10-CM

## 2013-04-13 NOTE — Telephone Encounter (Signed)
Received information regarding recent CT abd with incidental finding right breast mass  Ok for diag mammogram, refer gen Iona Beard to let pt know

## 2013-04-13 NOTE — Telephone Encounter (Signed)
Called the patient informed of results and referrals.

## 2013-04-14 ENCOUNTER — Other Ambulatory Visit: Payer: Self-pay | Admitting: Internal Medicine

## 2013-04-14 DIAGNOSIS — N6310 Unspecified lump in the right breast, unspecified quadrant: Secondary | ICD-10-CM

## 2013-04-16 ENCOUNTER — Ambulatory Visit
Admission: RE | Admit: 2013-04-16 | Discharge: 2013-04-16 | Disposition: A | Discharge status: Home / Self Care | Payer: Medicare Other | Source: Ambulatory Visit | Attending: Internal Medicine | Admitting: Internal Medicine

## 2013-04-16 DIAGNOSIS — N6310 Unspecified lump in the right breast, unspecified quadrant: Secondary | ICD-10-CM

## 2013-04-20 ENCOUNTER — Emergency Department (HOSPITAL_COMMUNITY)
Admission: EM | Admit: 2013-04-20 | Discharge: 2013-04-20 | Disposition: A | Discharge status: Home / Self Care | Payer: Medicare Other | Attending: Emergency Medicine | Admitting: Emergency Medicine

## 2013-04-20 ENCOUNTER — Encounter (HOSPITAL_COMMUNITY): Payer: Self-pay | Admitting: Emergency Medicine

## 2013-04-20 DIAGNOSIS — D649 Anemia, unspecified: Secondary | ICD-10-CM | POA: Insufficient documentation

## 2013-04-20 DIAGNOSIS — E1149 Type 2 diabetes mellitus with other diabetic neurological complication: Secondary | ICD-10-CM | POA: Insufficient documentation

## 2013-04-20 DIAGNOSIS — Z951 Presence of aortocoronary bypass graft: Secondary | ICD-10-CM | POA: Insufficient documentation

## 2013-04-20 DIAGNOSIS — Z794 Long term (current) use of insulin: Secondary | ICD-10-CM | POA: Insufficient documentation

## 2013-04-20 DIAGNOSIS — K089 Disorder of teeth and supporting structures, unspecified: Secondary | ICD-10-CM | POA: Insufficient documentation

## 2013-04-20 DIAGNOSIS — Z86718 Personal history of other venous thrombosis and embolism: Secondary | ICD-10-CM | POA: Insufficient documentation

## 2013-04-20 DIAGNOSIS — Z79899 Other long term (current) drug therapy: Secondary | ICD-10-CM | POA: Insufficient documentation

## 2013-04-20 DIAGNOSIS — Z7982 Long term (current) use of aspirin: Secondary | ICD-10-CM | POA: Insufficient documentation

## 2013-04-20 DIAGNOSIS — Z880 Allergy status to penicillin: Secondary | ICD-10-CM | POA: Insufficient documentation

## 2013-04-20 DIAGNOSIS — I160 Hypertensive urgency: Secondary | ICD-10-CM

## 2013-04-20 DIAGNOSIS — IMO0002 Reserved for concepts with insufficient information to code with codable children: Secondary | ICD-10-CM | POA: Insufficient documentation

## 2013-04-20 DIAGNOSIS — K029 Dental caries, unspecified: Secondary | ICD-10-CM | POA: Insufficient documentation

## 2013-04-20 DIAGNOSIS — F329 Major depressive disorder, single episode, unspecified: Secondary | ICD-10-CM | POA: Insufficient documentation

## 2013-04-20 DIAGNOSIS — I739 Peripheral vascular disease, unspecified: Secondary | ICD-10-CM | POA: Insufficient documentation

## 2013-04-20 DIAGNOSIS — I2510 Atherosclerotic heart disease of native coronary artery without angina pectoris: Secondary | ICD-10-CM | POA: Insufficient documentation

## 2013-04-20 DIAGNOSIS — E785 Hyperlipidemia, unspecified: Secondary | ICD-10-CM | POA: Insufficient documentation

## 2013-04-20 DIAGNOSIS — F3289 Other specified depressive episodes: Secondary | ICD-10-CM | POA: Insufficient documentation

## 2013-04-20 LAB — CBC WITH DIFFERENTIAL/PLATELET
Eosinophils Relative: 0 % (ref 0–5)
HCT: 34.6 % — ABNORMAL LOW (ref 36.0–46.0)
Hemoglobin: 11.3 g/dL — ABNORMAL LOW (ref 12.0–15.0)
Lymphocytes Relative: 4 % — ABNORMAL LOW (ref 12–46)
MCH: 27.2 pg (ref 26.0–34.0)
MCHC: 32.7 g/dL (ref 30.0–36.0)
MCV: 83.2 fL (ref 78.0–100.0)
Monocytes Absolute: 0.2 10*3/uL (ref 0.1–1.0)
Monocytes Relative: 1 % — ABNORMAL LOW (ref 3–12)
Neutro Abs: 11.9 10*3/uL — ABNORMAL HIGH (ref 1.7–7.7)
Platelets: 179 10*3/uL (ref 150–400)

## 2013-04-20 LAB — COMPREHENSIVE METABOLIC PANEL
ALT: 12 U/L (ref 0–35)
Albumin: 3.6 g/dL (ref 3.5–5.2)
BUN: 41 mg/dL — ABNORMAL HIGH (ref 6–23)
CO2: 22 mEq/L (ref 19–32)
Calcium: 9.1 mg/dL (ref 8.4–10.5)
Chloride: 98 mEq/L (ref 96–112)
Creatinine, Ser: 2.8 mg/dL — ABNORMAL HIGH (ref 0.50–1.10)
GFR calc Af Amer: 19 mL/min — ABNORMAL LOW (ref >90)
GFR calc non Af Amer: 16 mL/min — ABNORMAL LOW (ref >90)
Total Bilirubin: 0.4 mg/dL (ref 0.3–1.2)

## 2013-04-20 LAB — URINALYSIS, ROUTINE W REFLEX MICROSCOPIC
Ketones, ur: 15 mg/dL — AB
Leukocytes, UA: NEGATIVE
Nitrite: NEGATIVE
Protein, ur: >300 mg/dL — AB
Specific Gravity, Urine: 1.016 (ref 1.005–1.030)
Urobilinogen, UA: 0.2 mg/dL (ref 0.0–1.0)

## 2013-04-20 LAB — URINE MICROSCOPIC-ADD ON

## 2013-04-20 LAB — POCT I-STAT TROPONIN I: Troponin i, poc: 0.03 ng/mL (ref 0.00–0.08)

## 2013-04-20 MED ORDER — HYDROCODONE-ACETAMINOPHEN 5-325 MG PO TABS: 1.0000 | ORAL_TABLET | Freq: Three times a day (TID) | ORAL | Status: AC | PRN

## 2013-04-20 MED ORDER — CLINDAMYCIN HCL 150 MG PO CAPS: 300.0000 mg | ORAL_CAPSULE | Freq: Three times a day (TID) | ORAL | Status: AC

## 2013-04-20 MED ORDER — HYDRALAZINE HCL 20 MG/ML IJ SOLN
10.0000 mg | Freq: Once | INTRAMUSCULAR | Status: AC
  Administered 2013-04-20: 13:00:00 via INTRAVENOUS
  Filled 2013-04-20: qty 1

## 2013-04-20 MED ORDER — CLINDAMYCIN HCL 300 MG PO CAPS
300.0000 mg | ORAL_CAPSULE | Freq: Once | ORAL | Status: AC
  Administered 2013-04-20: 300 mg via ORAL
  Filled 2013-04-20 (×2): qty 1

## 2013-04-20 MED ORDER — HYDROMORPHONE HCL PF 1 MG/ML IJ SOLN
1.0000 mg | Freq: Once | INTRAMUSCULAR | Status: AC
  Administered 2013-04-20: 1 mg via INTRAVENOUS
  Filled 2013-04-20: qty 1

## 2013-04-20 NOTE — ED Notes (Signed)
Claris Che (sister)  [phone]

## 2013-04-20 NOTE — ED Provider Notes (Signed)
CSN: [id number]     Arrival date & time 04/20/13  1143 History   First MD Initiated Contact with Patient 04/20/13 1150     Chief Complaint  Patient presents with  . Hypertension  . Dental Pain    HPI  Patient presents from her dentists office due to concerns of hypertension. Patient has a previously diagnosed necrotic tooth that was scheduled to be removed today. At the pre-operative check, the patient is on a hypertensive. She denies new changes in her condition, such as chest pain, dyspnea, abdominal pain, lightheadedness, disequilibrium, headaches, visual changes. She states that she takes her medication as directed. She mostly complains of severe pain in her right posterior mouth. No dyspnea, dysphagia, throat fullness, fevers, chills.   Past Medical History  Diagnosis Date  . CAD (coronary artery disease)     s/p CABG  . HLD (hyperlipidemia)   . HTN (hypertension)   . Acute MI   . Lung nodule   . DM type 1 (diabetes mellitus, type 1)   . Back pain   . Anemia   . Allergic rhinitis   . Osteopenia   . Vaginitis   . Ganglion of joint   . Depression   . DVT (deep venous thrombosis)   . Paraplegia   . Anemia   . GERD (gastroesophageal reflux disease)   . Type II or unspecified type diabetes mellitus without mention of complication, uncontrolled 12/03/2011  . Peripheral vascular disease 08/14/12    cancellrd scheduled appointment with Dr. Nanetta Batty   Past Surgical History  Procedure Laterality Date  . Rotator cuff repair    . Cesarean section    . Coronary artery bypass graft  03/2009  . Laminectomy  2010   Family History  Problem Relation Age of Onset  . Diabetes    . Hypertension    . Stroke    . Coronary artery disease     History  Substance Use Topics  . Smoking status: Never Smoker   . Smokeless tobacco: Not on file  . Alcohol Use: No   OB History   Grav Para Term Preterm Abortions TAB SAB Ect Mult Living                 Review of Systems    Constitutional:       Per HPI, otherwise negative  HENT:       Per HPI, otherwise negative  Eyes: Negative for visual disturbance.  Respiratory:       Per HPI, otherwise negative  Cardiovascular:       Per HPI, otherwise negative  Gastrointestinal: Negative for vomiting.  Endocrine:       Negative aside from HPI  Genitourinary:       Neg aside from HPI   Musculoskeletal:       Per HPI, otherwise negative  Skin: Negative.   Neurological: Positive for headaches. Negative for dizziness, tremors, syncope, speech difficulty, weakness, light-headedness and numbness.    Allergies  Penicillins  Home Medications   Current Outpatient Rx  Name  Route  Sig  Dispense  Refill  . aspirin 81 MG tablet   Oral   Take 81 mg by mouth daily.           . B-D TB SYRINGE 1CC/25GX5/8" 25G X 5/8" 1 ML MISC      USE WITH B12 INJECTIONS   100 each   0   . calcitRIOL (ROCALTROL) 0.25 MCG capsule      Take  1 by mouth on Monday, Wednesday and friday         . cetirizine (ZYRTEC) 10 MG tablet   Oral   Take 1 tablet (10 mg total) by mouth daily.   30 tablet   11   . citalopram (CELEXA) 10 MG tablet      TAKE 1 TABLET BY MOUTH ONCE DAILY   90 tablet   3   . citalopram (CELEXA) 10 MG tablet      TAKE 1 TABLET BY MOUTH ONCE DAILY   90 tablet   3   . cyanocobalamin (,VITAMIN B-12,) 1000 MCG/ML injection   Intramuscular   Inject 1,000 mcg into the muscle every 30 (thirty) days.           . cyanocobalamin 1000 MCG tablet   Oral   Take 100 mcg by mouth daily.           . ferrous fumarate-iron polysaccharide complex (TANDEM) 162-115.2 MG CAPS   Oral   Take 1 capsule by mouth daily with breakfast.           . fluticasone (FLONASE) 50 MCG/ACT nasal spray   Nasal   Place 2 sprays into the nose daily.   16 g   5   . folic acid (FOLVITE) 1 MG tablet   Oral   Take 1 mg by mouth daily.           Marland Kitchen glimepiride (AMARYL) 1 MG tablet      TAKE 1/2 TABLET BY MOUTH DAILY    45 tablet   3   . glucose blood (ONE TOUCH TEST STRIPS) test strip      Use as instructed   100 each   5   . lansoprazole (PREVACID) 30 MG capsule   Oral   Take 30 mg by mouth daily.           Marland Kitchen losartan (COZAAR) 50 MG tablet   Oral   Take 50 mg by mouth daily.         . nitroGLYCERIN (NITROSTAT) 0.4 MG SL tablet   Sublingual   Place 0.4 mg under the tongue every 5 (five) minutes as needed.           Marland Kitchen NOVOLOG 100 UNIT/ML injection      USE AS DIRECTED   10 mL   11   . pravastatin (PRAVACHOL) 10 MG tablet      TAKE 1 TABLET BY MOUTH EVERY DAY   30 tablet   8   . SYRINGE-NEEDLE, DISP, 3 ML (TERUMO SURGUARD2 SYRINGE) 25G X 1" 3 ML MISC   Subcutaneous   Inject 1 application into the skin as directed.   100 each   1   . traMADol (ULTRAM) 50 MG tablet      TAKE 1 TABLET BY MOUTH EVERY 6 HOURS AS NEEDED   240 tablet   1    BP 165/97  Pulse 110  Temp(Src) 99.6 F (37.6 C) (Oral)  Resp 20  SpO2 95% Physical Exam  Nursing note and vitals reviewed. Constitutional: She is oriented to person, place, and time. She appears well-developed and well-nourished. No distress.  HENT:  Head: Normocephalic and atraumatic.  Mouth/Throat:    Eyes: Conjunctivae and EOM are normal.  Cardiovascular: Normal rate, regular rhythm and normal heart sounds.   Pulmonary/Chest: Effort normal and breath sounds normal. No stridor. No respiratory distress.  Abdominal: She exhibits no distension. There is no tenderness.  Musculoskeletal: She exhibits no edema.  Neurological: She is alert and oriented to person, place, and time. No cranial nerve deficit.  Skin: Skin is warm and dry.  Psychiatric: She has a normal mood and affect.    ED Course  Procedures (including critical care time) Labs Review Labs Reviewed  CBC WITH DIFFERENTIAL  COMPREHENSIVE METABOLIC PANEL   Imaging Review No results found.  EKG Interpretation     Ventricular Rate:  100 PR Interval:  163 QRS  Duration: 77 QT Interval:  348 QTC Calculation: 449 R Axis:   3 Text Interpretation:  Sinus tachycardia Probable left atrial enlargement Abnrm T, consider ischemia, anterolateral lds Abnormal ekg           Labs largely reassuring.   Update: Blood pressure substantially better MDM  No diagnosis found.  Patient presents from her dental office with concerns of hypertension.  Notably, exam the patient is awake alert, and in no distress.  No nausea be spontaneously and has no evidence of neurologic compromise.  Patient's evaluation here is largely reassuring, with continued evidence of chronic kidney disease, mild leukocytosis, but otherwise no alarming values.  The patient remained in no distress throughout her emergency department course.  Given the patient's discretion of ongoing dental issues, there is some suspicion for this etiology as causative for her blood pressure increased.  The patient does have multiple medical problems, including kidney disease, there are no substantially abnormal labs requiring inpatient evaluation.  Rather, the patient was discharged in stable condition with continued antibiotics for her dental issue, primary care followup.  Gerhard Munch, MD 04/20/13 1447

## 2013-04-20 NOTE — ED Notes (Signed)
Has right side dental pain went to dental and blood pressure was high. EMS called reported BP 217/107 with a headache. Alert answering and following commands appropriate.

## 2013-04-21 LAB — URINE CULTURE

## 2013-04-23 ENCOUNTER — Ambulatory Visit (INDEPENDENT_AMBULATORY_CARE_PROVIDER_SITE_OTHER): Payer: Medicare Other | Admitting: Surgery

## 2013-04-23 ENCOUNTER — Encounter (INDEPENDENT_AMBULATORY_CARE_PROVIDER_SITE_OTHER): Payer: Self-pay | Admitting: Surgery

## 2013-04-23 VITALS — BP 130/78 | HR 72 | Temp 99.1°F | Resp 14 | Ht 63.5 in | Wt 124.8 lb

## 2013-04-23 DIAGNOSIS — N63 Unspecified lump in unspecified breast: Secondary | ICD-10-CM

## 2013-04-23 NOTE — Patient Instructions (Signed)
WILL ARRANGE FOR MRI OR CT TO FURTHER EVALUATE.

## 2013-04-23 NOTE — Progress Notes (Signed)
Patient ID: Veronica Copeland, female   DOB: December 19, 1945, 67 y.o.   MRN: [id number]  Chief Complaint  Patient presents with  . New Evaluation    eval rt br mass    HPI Veronica Copeland is a 67 y.o. female.  Patient sent at the request of Dr. Raphael Gibney do to an abnormal CT scan which showed a right breast mass with coarse microcalcifications done at [facility]. She was then sent to the breast Center [facility] for mammography which showed no abnormality. She does have very dense breast tissue though. She is referred at the request of Dr. Raphael Gibney do to the incidental finding in her right breast. Denies any history of breast pain, breast mass or nipple discharge. No history of breast cancer in her family. No history of previous breast biopsy. HPI  Past Medical History  Diagnosis Date  . CAD (coronary artery disease)     s/p CABG  . HLD (hyperlipidemia)   . HTN (hypertension)   . Acute MI   . Lung nodule   . DM type 1 (diabetes mellitus, type 1)   . Back pain   . Anemia   . Allergic rhinitis   . Osteopenia   . Vaginitis   . Ganglion of joint   . Depression   . DVT (deep venous thrombosis)   . Paraplegia   . Anemia   . GERD (gastroesophageal reflux disease)   . Type II or unspecified type diabetes mellitus without mention of complication, uncontrolled 12/03/2011  . Peripheral vascular disease 08/14/12    cancellrd scheduled appointment with Dr. Nanetta Batty    Past Surgical History  Procedure Laterality Date  . Rotator cuff repair    . Cesarean section    . Coronary artery bypass graft  03/2009  . Laminectomy  2010  . Tumor excision      in stomach    Family History  Problem Relation Age of Onset  . Diabetes    . Hypertension    . Stroke    . Coronary artery disease      Social History History  Substance Use Topics  . Smoking status: Never Smoker   . Smokeless tobacco: Never Used  . Alcohol Use: No    Allergies  Allergen Reactions  .  Penicillins Other (See Comments)    Yeast     Current Outpatient Prescriptions  Medication Sig Dispense Refill  . aspirin 81 MG tablet Take 81 mg by mouth daily.        . calcitRIOL (ROCALTROL) 0.25 MCG capsule Take 1 by mouth on Monday, Wednesday and friday      . cetirizine (ZYRTEC) 10 MG tablet Take 10 mg by mouth daily.      . citalopram (CELEXA) 10 MG tablet TAKE 1 TABLET BY MOUTH ONCE DAILY  90 tablet  3  . clindamycin (CLEOCIN) 150 MG capsule Take 2 capsules (300 mg total) by mouth 3 (three) times daily.  42 capsule  0  . fluticasone (FLONASE) 50 MCG/ACT nasal spray Place 2 sprays into the nose daily.  16 g  5  . glimepiride (AMARYL) 1 MG tablet TAKE 1/2 TABLET BY MOUTH DAILY  45 tablet  3  . glucose blood (ONE TOUCH TEST STRIPS) test strip Use as instructed  100 each  5  . HYDROcodone-acetaminophen (NORCO/VICODIN) 5-325 MG per tablet Take 1 tablet by mouth every 8 (eight) hours as needed for pain.  15 tablet  0  . insulin aspart (  NOVOLOG) 100 UNIT/ML injection Inject 8-10 Units into the skin 2 (two) times daily.      Marland Kitchen losartan (COZAAR) 50 MG tablet Take 50 mg by mouth daily.      . nitroGLYCERIN (NITROSTAT) 0.4 MG SL tablet Place 0.4 mg under the tongue every 5 (five) minutes as needed.        . pravastatin (PRAVACHOL) 10 MG tablet TAKE 1 TABLET BY MOUTH EVERY DAY  30 tablet  8  . SYRINGE-NEEDLE, DISP, 3 ML (TERUMO SURGUARD2 SYRINGE) 25G X 1" 3 ML MISC Inject 1 application into the skin as directed.  100 each  1   No current facility-administered medications for this visit.    Review of Systems Review of Systems  Constitutional: Negative for fever, chills and unexpected weight change.  HENT: Negative for congestion, hearing loss, sore throat, trouble swallowing and voice change.   Eyes: Negative for visual disturbance.  Respiratory: Negative for cough and wheezing.   Cardiovascular: Negative for chest pain, palpitations and leg swelling.  Gastrointestinal: Negative for nausea,  vomiting, abdominal pain, diarrhea, constipation, blood in stool, abdominal distention and anal bleeding.  Genitourinary: Negative for hematuria, vaginal bleeding and difficulty urinating.  Musculoskeletal: Negative for arthralgias.  Skin: Negative for rash and wound.  Neurological: Negative for seizures, syncope and headaches.  Hematological: Negative for adenopathy. Does not bruise/bleed easily.  Psychiatric/Behavioral: Negative for confusion.    Blood pressure 130/78, pulse 72, temperature 99.1 F (37.3 C), temperature source Temporal, resp. rate 14, height 5' 3.5" (1.613 m), weight 124 lb 12.8 oz (56.609 kg).  Physical Exam Physical Exam  Constitutional: She is oriented to person, place, and time. She appears well-developed and well-nourished.  HENT:  Head: Normocephalic and atraumatic.  Eyes: Pupils are equal, round, and reactive to light. No scleral icterus.  Neck: Normal range of motion. Neck supple.  Pulmonary/Chest: Right breast exhibits no inverted nipple, no mass, no nipple discharge, no skin change and no tenderness. Left breast exhibits no inverted nipple, no mass, no nipple discharge, no skin change and no tenderness. Breasts are symmetrical.    Musculoskeletal: Normal range of motion.  Neurological: She is alert and oriented to person, place, and time.  Skin: Skin is warm and dry.  Psychiatric: She has a normal mood and affect. Her behavior is normal. Judgment normal.    Data Reviewed CT scan of abdomen and pelvis from [facility] shows right breast mass with microcalcifications. Images not available. Clinical Data: The patient had a recent CT scan of the abdomen and  pelvis where a soft tissue mass with a single coarse calcification  was seen in the right breast. A report from [facility] dated 04/12/2013 is available but the images are  not.  DIGITAL DIAGNOSTIC BILATERAL MAMMOGRAM WITH CAD  Comparison: 04/20/2007  Findings:    ACR Breast Density Category d: The breast tissue is extremely  dense, which lowers the sensitivity of mammography.  Diffuse scattered calcified benign appearing calcifications and  calcified fibroadenomata are seen in both breast. There is no  suspicious mass or malignant-type microcalcifications.  Mammographic images were processed with CAD.  IMPRESSION:  No mammographic evidence of malignancy in either breast.  RECOMMENDATION:  Bilateral screening mammogram in 1 year is recommended.  I recommend correlation with the patients recent CT scan dated  04/12/2013. The images will be called for and when they become  available an addendum will be made to the report.  I have discussed the findings  and recommendations with the patient.  Results were also provided in writing at the conclusion of the  visit. If applicable, a reminder letter will be sent to the  patient regarding her next appointment.  BI-RADS CATEGORY 2: Benign finding(s).  Original Report Authenticated By: Baird Lyons, M.D.  Assessment    Incidental right breast mass picked up on CT scanning.    Plan     Mammography shows no abnormality except for dense breast tissue which may decrease the sensitivity of this test. No report back from radiologist since they were going to review the CT images from [facility]. Recommend either MRI of the breast versus chest CT without contrast to further verify this lesion. She does have baseline renal insufficiency therefore making MRI less possible.  Will ask radiology to comment and to hopefully guide [place] all on  what test would be beneficial for this patient.        Kameshia Madruga A. 04/23/2013, 12:01 PM

## 2013-04-26 ENCOUNTER — Other Ambulatory Visit (INDEPENDENT_AMBULATORY_CARE_PROVIDER_SITE_OTHER): Payer: Self-pay

## 2013-04-26 ENCOUNTER — Telehealth (INDEPENDENT_AMBULATORY_CARE_PROVIDER_SITE_OTHER): Payer: Self-pay | Admitting: *Deleted

## 2013-04-26 DIAGNOSIS — N6310 Unspecified lump in the right breast, unspecified quadrant: Secondary | ICD-10-CM

## 2013-04-26 NOTE — Telephone Encounter (Signed)
I spoke with pt to inform her of appt for MRI at [facility] on 04/30/13 with an arrival time of 8:45am.

## 2013-04-28 ENCOUNTER — Telehealth (INDEPENDENT_AMBULATORY_CARE_PROVIDER_SITE_OTHER): Payer: Self-pay | Admitting: *Deleted

## 2013-04-28 NOTE — Telephone Encounter (Signed)
Error

## 2013-04-29 ENCOUNTER — Telehealth (INDEPENDENT_AMBULATORY_CARE_PROVIDER_SITE_OTHER): Payer: Self-pay | Admitting: *Deleted

## 2013-04-29 NOTE — Telephone Encounter (Signed)
I spoke to Dr. Chilton Si this morning who states she has requested to CT Abd images from [facility] so that she can review them and compare them to the CT Chest that she has.  She states that she believes the area they are showing is just calcification and is stable.  She states she believes she can see the same area on the CT Chest which she has images for but definitely needs those images to be able to compare.  She states that she just doesn't think patient being sent for a MRI at which patient can't receive contrast would not be beneficial.  However Dr. Luisa Hart feels like further evaluation and being able to accurately determine what this area is, is very important.  I will try to contact patient to see if there is any way of her being able to go by and get the images then bring them to the breast center (Dr. Chilton Si) for review.  I just spoke to the son Angelyne Terwilliger who states they will go by tomorrow or this weekend to get the CT Abd/Pelvis images on a CD then bring them to Dr. Chilton Si at the beginning of next week.  If Dr. Luisa Hart is agreeable with this then the MRI scan will be cancelled for tomorrow and we can reschedule if necessary after Dr. Chilton Si reviews the CT scan images?

## 2013-04-29 NOTE — Telephone Encounter (Signed)
error 

## 2013-04-29 NOTE — Telephone Encounter (Signed)
Spoke to Dr. Luisa Hart in the OR regarding the below plan and Dr. Luisa Hart did give approval for the MRI to be cancelled and to allow time for Dr. Chilton Si to review images to determine the best plan for the patient.  MRI was cancelled with GI by Dr. Chilton Si however Dr. Luisa Hart is aware and agreeable with this.

## 2013-04-30 ENCOUNTER — Other Ambulatory Visit: Payer: Medicare Other

## 2013-05-13 ENCOUNTER — Other Ambulatory Visit: Payer: Self-pay

## 2013-05-19 LAB — HM MAMMOGRAPHY

## 2013-05-27 ENCOUNTER — Other Ambulatory Visit: Payer: Self-pay | Admitting: Internal Medicine

## 2013-05-27 NOTE — Telephone Encounter (Signed)
Faxed hardcopy to [facility]  

## 2013-05-27 NOTE — Telephone Encounter (Signed)
Done hardcopy to [facility]  

## 2013-07-20 ENCOUNTER — Encounter: Payer: Self-pay | Admitting: Internal Medicine

## 2013-07-20 ENCOUNTER — Ambulatory Visit (INDEPENDENT_AMBULATORY_CARE_PROVIDER_SITE_OTHER): Payer: Medicare Other | Admitting: Internal Medicine

## 2013-07-20 VITALS — BP 124/82 | HR 103 | Temp 98.2°F | Wt 128.8 lb

## 2013-07-20 DIAGNOSIS — IMO0001 Reserved for inherently not codable concepts without codable children: Secondary | ICD-10-CM

## 2013-07-20 DIAGNOSIS — Z0000 Encounter for general adult medical examination without abnormal findings: Secondary | ICD-10-CM

## 2013-07-20 DIAGNOSIS — Z23 Encounter for immunization: Secondary | ICD-10-CM

## 2013-07-20 DIAGNOSIS — E1165 Type 2 diabetes mellitus with hyperglycemia: Secondary | ICD-10-CM

## 2013-07-20 NOTE — Progress Notes (Signed)
Subjective:    Patient ID: Veronica Copeland, female    DOB: 1946-05-07, 68 y.o.   MRN: [id number]  HPI Here for wellness and f/u;  Overall doing ok;  Pt denies CP, worsening SOB, DOE, wheezing, orthopnea, PND, worsening LE edema, palpitations, dizziness or syncope.  Pt denies neurological change such as new headache, facial or extremity weakness.  Pt denies polydipsia, polyuria, or low sugar symptoms. Pt states overall good compliance with treatment and medications, good tolerability, and has been trying to follow lower cholesterol diet.  Pt denies worsening depressive symptoms, suicidal ideation or panic. No fever, night sweats, wt loss, loss of appetite, or other constitutional symptoms.  Pt states good ability with ADL's, has low fall risk, home safety reviewed and adequate, no other significant changes in hearing or vision, and only occasionally active with exercise. Recent mammogram with right mass, but told nonmalignant, no further tx planned.  Has f/u with renal jan 20, to consider start dialysis/renal transplant, after stopped due to ? Of malignancy of right breast after ct abd at [facility] showed incidental mass, apparently now determined nonmalignant Past Medical History  Diagnosis Date  . CAD (coronary artery disease)     s/p CABG  . HLD (hyperlipidemia)   . HTN (hypertension)   . Acute MI   . Lung nodule   . DM type 1 (diabetes mellitus, type 1)   . Back pain   . Anemia   . Allergic rhinitis   . Osteopenia   . Vaginitis   . Ganglion of joint   . Depression   . DVT (deep venous thrombosis)   . Paraplegia   . Anemia   . GERD (gastroesophageal reflux disease)   . Type II or unspecified type diabetes mellitus without mention of complication, uncontrolled 12/03/2011  . Peripheral vascular disease 08/14/12    cancellrd scheduled appointment with Dr. Quay Burow   Past Surgical History  Procedure Laterality Date  . Rotator cuff repair    . Cesarean section    . Coronary artery bypass  graft  03/2009  . Laminectomy  2010  . Tumor excision      in stomach    reports that she has never smoked. She has never used smokeless tobacco. She reports that she does not drink alcohol or use illicit drugs. family history includes Coronary artery disease in an other family member; Diabetes in an other family member; Hypertension in an other family member; Stroke in an other family member. Allergies  Allergen Reactions  . Penicillins Other (See Comments)    Yeast    Current Outpatient Prescriptions on File Prior to Visit  Medication Sig Dispense Refill  . aspirin 81 MG tablet Take 81 mg by mouth daily.        . calcitRIOL (ROCALTROL) 0.25 MCG capsule Take 1 by mouth on Monday, Wednesday and friday      . cetirizine (ZYRTEC) 10 MG tablet Take 10 mg by mouth daily.      . citalopram (CELEXA) 10 MG tablet TAKE 1 TABLET BY MOUTH ONCE DAILY  90 tablet  3  . fluticasone (FLONASE) 50 MCG/ACT nasal spray Place 2 sprays into the nose daily.  16 g  5  . glimepiride (AMARYL) 1 MG tablet TAKE 1/2 TABLET BY MOUTH DAILY  45 tablet  3  . glucose blood (ONE TOUCH TEST STRIPS) test strip Use as instructed  100 each  5  . HYDROcodone-acetaminophen (NORCO/VICODIN) 5-325 MG per tablet Take 1 tablet by mouth  every 8 (eight) hours as needed for pain.  15 tablet  0  . insulin aspart (NOVOLOG) 100 UNIT/ML injection Inject 8-10 Units into the skin 2 (two) times daily.      Marland Kitchen losartan (COZAAR) 50 MG tablet Take 50 mg by mouth daily.      . nitroGLYCERIN (NITROSTAT) 0.4 MG SL tablet Place 0.4 mg under the tongue every 5 (five) minutes as needed.        . pravastatin (PRAVACHOL) 10 MG tablet TAKE 1 TABLET BY MOUTH EVERY DAY  30 tablet  8  . SYRINGE-NEEDLE, DISP, 3 ML (TERUMO SURGUARD2 SYRINGE) 25G X 1" 3 ML MISC Inject 1 application into the skin as directed.  100 each  1  . traMADol (ULTRAM) 50 MG tablet TAKE 1 TABLET BY MOUTH EVERY 6 HOURS AS NEEDED  120 tablet  1   No current facility-administered medications  on file prior to visit.   Review of Systems Constitutional: Negative for diaphoresis, activity change, appetite change or unexpected weight change.  HENT: Negative for hearing loss, ear pain, facial swelling, mouth sores and neck stiffness.   Eyes: Negative for pain, redness and visual disturbance.  Respiratory: Negative for shortness of breath and wheezing.   Cardiovascular: Negative for chest pain and palpitations.  Gastrointestinal: Negative for diarrhea, blood in stool, abdominal distention or other pain Genitourinary: Negative for hematuria, flank pain or change in urine volume.  Musculoskeletal: Negative for myalgias and joint swelling.  Skin: Negative for color change and wound.  Neurological: Negative for syncope and numbness. other than noted Hematological: Negative for adenopathy.  Psychiatric/Behavioral: Negative for hallucinations, self-injury, decreased concentration and agitation.      Objective:   Physical Exam BP 124/82  Pulse 103  Temp(Src) 98.2 F (36.8 C) (Oral)  Wt 128 lb 12 oz (58.401 kg)  SpO2 96% VS noted, walks with walker due to left knee Constitutional: Pt is oriented to person, place, and time. Appears well-developed and well-nourished.  Head: Normocephalic and atraumatic.  Right Ear: External ear normal.  Left Ear: External ear normal.  Nose: Nose normal.  Mouth/Throat: Oropharynx is clear and moist.  Eyes: Conjunctivae and EOM are normal. Pupils are equal, round, and reactive to light.  Neck: Normal range of motion. Neck supple. No JVD present. No tracheal deviation present.  Cardiovascular: Normal rate, regular rhythm, normal heart sounds and intact distal pulses.   Pulmonary/Chest: Effort normal and breath sounds normal.  Abdominal: Soft. Bowel sounds are normal. There is no tenderness. No HSM  Musculoskeletal: Normal range of motion. Exhibits no edema.  Lymphadenopathy:  Has no cervical adenopathy.  Neurological: Pt is alert and oriented to  person, place, and time. Pt has normal reflexes. No cranial nerve deficit.  Skin: Skin is warm and dry. No rash noted.  Psychiatric:  Has  normal mood and affect. Behavior is normal.      Assessment & Plan:

## 2013-07-20 NOTE — Patient Instructions (Addendum)
You had the new Prevnar pneumonia shot today Please continue all other medications as before, and refills have been done if requested. Please have the pharmacy call with any other refills you may need. Please continue your efforts at being more active, low cholesterol diet, and weight control. You are otherwise up to date with prevention measures today. Please keep your appointments with your specialists as you have planned- renal  You will be contacted regarding the referral for: colonoscopy  We will hold on labs since you have labs planned for Jan 20.  Please return in 6 months, or sooner if needed

## 2013-07-20 NOTE — Addendum Note (Signed)
Addended by: Sharon Seller B on: 07/20/2013 09:30 AM   Modules accepted: Orders

## 2013-07-20 NOTE — Assessment & Plan Note (Signed)
D/w pt, declines a1c as she believes this will be done jan 20 with renal

## 2013-07-20 NOTE — Progress Notes (Signed)
Pre-visit discussion using our clinic review tool. No additional management support is needed unless otherwise documented below in the visit note.  

## 2013-07-20 NOTE — Assessment & Plan Note (Signed)
Overall doing well, age appropriate education and counseling updated, referrals for preventative services and immunizations addressed, dietary and smoking counseling addressed, most recent labs reviewed.  I have personally reviewed and have noted: 1) the patient's medical and social history 2) The pt's use of alcohol, tobacco, and illicit drugs 3) [name] current medications and supplements 4) Functional ability including ADL's, fall risk, home safety risk, hearing and visual impairment 5) Diet and physical activities 6) Evidence for depression or mood disorder 7) [name] height, weight, and BMI have been recorded in the chart I have made referrals, and provided counseling and education based on review of the above For Prevnar today Declines labs as will be done jan 20 with renal

## 2013-07-21 ENCOUNTER — Telehealth: Payer: Self-pay

## 2013-07-21 DIAGNOSIS — IMO0001 Reserved for inherently not codable concepts without codable children: Secondary | ICD-10-CM

## 2013-07-21 DIAGNOSIS — E1165 Type 2 diabetes mellitus with hyperglycemia: Principal | ICD-10-CM

## 2013-07-21 NOTE — Telephone Encounter (Signed)
[facility] has only lab results from [facility] done on 04/12/2013,  a1c results were 10.9

## 2013-07-21 NOTE — Telephone Encounter (Signed)
Relevant patient education assigned to patient using Emmi. ° °

## 2013-07-21 NOTE — Telephone Encounter (Signed)
Message copied by Jamesetta Orleans on Wed Jul 21, 2013 12:04 PM ------      Message from: Biagio Borg      Created: Tue Jul 20, 2013  9:11 AM      Regarding: for labs since may 2014       Pt thinks may have had a1c with renal and/or other labs - to contact [facility] for this if has had this done please ------

## 2013-07-23 NOTE — Addendum Note (Signed)
Addended by: Biagio Borg on: 07/23/2013 05:41 PM   Modules accepted: Orders

## 2013-07-23 NOTE — Telephone Encounter (Signed)
Not sure I routed the last message - see message

## 2013-07-23 NOTE — Telephone Encounter (Signed)
Robin to contact pt; i have found her last a1c was quite poor from [facility] oct 2014 - 10.9  Since her case is complicated, we should refer to [facility] endocrinology - I will do

## 2013-08-01 ENCOUNTER — Other Ambulatory Visit: Payer: Self-pay | Admitting: Internal Medicine

## 2013-08-03 ENCOUNTER — Encounter: Payer: Medicare Other | Admitting: Cardiovascular Disease

## 2013-08-09 ENCOUNTER — Emergency Department (HOSPITAL_COMMUNITY): Payer: Medicare Other

## 2013-08-09 ENCOUNTER — Inpatient Hospital Stay (HOSPITAL_COMMUNITY)
Admission: EM | Admit: 2013-08-09 | Discharge: 2013-09-05 | DRG: 264 | Disposition: E | Payer: Medicare Other | Attending: Critical Care Medicine | Admitting: Critical Care Medicine

## 2013-08-09 ENCOUNTER — Encounter (HOSPITAL_COMMUNITY): Payer: Self-pay | Admitting: Emergency Medicine

## 2013-08-09 DIAGNOSIS — J9601 Acute respiratory failure with hypoxia: Secondary | ICD-10-CM

## 2013-08-09 DIAGNOSIS — E119 Type 2 diabetes mellitus without complications: Secondary | ICD-10-CM | POA: Diagnosis present

## 2013-08-09 DIAGNOSIS — E1165 Type 2 diabetes mellitus with hyperglycemia: Secondary | ICD-10-CM

## 2013-08-09 DIAGNOSIS — I5082 Biventricular heart failure: Secondary | ICD-10-CM | POA: Diagnosis present

## 2013-08-09 DIAGNOSIS — Z794 Long term (current) use of insulin: Secondary | ICD-10-CM

## 2013-08-09 DIAGNOSIS — I469 Cardiac arrest, cause unspecified: Secondary | ICD-10-CM

## 2013-08-09 DIAGNOSIS — R0601 Orthopnea: Secondary | ICD-10-CM | POA: Diagnosis present

## 2013-08-09 DIAGNOSIS — Z992 Dependence on renal dialysis: Secondary | ICD-10-CM

## 2013-08-09 DIAGNOSIS — R5381 Other malaise: Secondary | ICD-10-CM

## 2013-08-09 DIAGNOSIS — E440 Moderate protein-calorie malnutrition: Secondary | ICD-10-CM | POA: Diagnosis present

## 2013-08-09 DIAGNOSIS — E872 Acidosis, unspecified: Secondary | ICD-10-CM | POA: Diagnosis not present

## 2013-08-09 DIAGNOSIS — I5041 Acute combined systolic (congestive) and diastolic (congestive) heart failure: Principal | ICD-10-CM

## 2013-08-09 DIAGNOSIS — D72829 Elevated white blood cell count, unspecified: Secondary | ICD-10-CM

## 2013-08-09 DIAGNOSIS — N184 Chronic kidney disease, stage 4 (severe): Secondary | ICD-10-CM

## 2013-08-09 DIAGNOSIS — I509 Heart failure, unspecified: Secondary | ICD-10-CM

## 2013-08-09 DIAGNOSIS — I219 Acute myocardial infarction, unspecified: Secondary | ICD-10-CM

## 2013-08-09 DIAGNOSIS — IMO0001 Reserved for inherently not codable concepts without codable children: Secondary | ICD-10-CM

## 2013-08-09 DIAGNOSIS — I498 Other specified cardiac arrhythmias: Secondary | ICD-10-CM | POA: Diagnosis present

## 2013-08-09 DIAGNOSIS — Z7982 Long term (current) use of aspirin: Secondary | ICD-10-CM

## 2013-08-09 DIAGNOSIS — F3289 Other specified depressive episodes: Secondary | ICD-10-CM | POA: Diagnosis present

## 2013-08-09 DIAGNOSIS — N179 Acute kidney failure, unspecified: Secondary | ICD-10-CM

## 2013-08-09 DIAGNOSIS — N189 Chronic kidney disease, unspecified: Secondary | ICD-10-CM

## 2013-08-09 DIAGNOSIS — IMO0002 Reserved for concepts with insufficient information to code with codable children: Secondary | ICD-10-CM

## 2013-08-09 DIAGNOSIS — J9600 Acute respiratory failure, unspecified whether with hypoxia or hypercapnia: Secondary | ICD-10-CM

## 2013-08-09 DIAGNOSIS — M7989 Other specified soft tissue disorders: Secondary | ICD-10-CM

## 2013-08-09 DIAGNOSIS — I82409 Acute embolism and thrombosis of unspecified deep veins of unspecified lower extremity: Secondary | ICD-10-CM

## 2013-08-09 DIAGNOSIS — D631 Anemia in chronic kidney disease: Secondary | ICD-10-CM | POA: Diagnosis present

## 2013-08-09 DIAGNOSIS — I2589 Other forms of chronic ischemic heart disease: Secondary | ICD-10-CM | POA: Diagnosis present

## 2013-08-09 DIAGNOSIS — I4510 Unspecified right bundle-branch block: Secondary | ICD-10-CM | POA: Diagnosis present

## 2013-08-09 DIAGNOSIS — K219 Gastro-esophageal reflux disease without esophagitis: Secondary | ICD-10-CM | POA: Diagnosis present

## 2013-08-09 DIAGNOSIS — F329 Major depressive disorder, single episode, unspecified: Secondary | ICD-10-CM | POA: Diagnosis present

## 2013-08-09 DIAGNOSIS — N186 End stage renal disease: Secondary | ICD-10-CM

## 2013-08-09 DIAGNOSIS — R570 Cardiogenic shock: Secondary | ICD-10-CM | POA: Diagnosis not present

## 2013-08-09 DIAGNOSIS — I2510 Atherosclerotic heart disease of native coronary artery without angina pectoris: Secondary | ICD-10-CM

## 2013-08-09 DIAGNOSIS — Z8673 Personal history of transient ischemic attack (TIA), and cerebral infarction without residual deficits: Secondary | ICD-10-CM

## 2013-08-09 DIAGNOSIS — Z951 Presence of aortocoronary bypass graft: Secondary | ICD-10-CM

## 2013-08-09 DIAGNOSIS — I4891 Unspecified atrial fibrillation: Secondary | ICD-10-CM

## 2013-08-09 DIAGNOSIS — I1310 Hypertensive heart and chronic kidney disease without heart failure, with stage 1 through stage 4 chronic kidney disease, or unspecified chronic kidney disease: Secondary | ICD-10-CM

## 2013-08-09 DIAGNOSIS — E1151 Type 2 diabetes mellitus with diabetic peripheral angiopathy without gangrene: Secondary | ICD-10-CM | POA: Diagnosis present

## 2013-08-09 DIAGNOSIS — J309 Allergic rhinitis, unspecified: Secondary | ICD-10-CM

## 2013-08-09 DIAGNOSIS — J189 Pneumonia, unspecified organism: Secondary | ICD-10-CM | POA: Diagnosis present

## 2013-08-09 DIAGNOSIS — D649 Anemia, unspecified: Secondary | ICD-10-CM

## 2013-08-09 DIAGNOSIS — E785 Hyperlipidemia, unspecified: Secondary | ICD-10-CM | POA: Diagnosis present

## 2013-08-09 DIAGNOSIS — N2581 Secondary hyperparathyroidism of renal origin: Secondary | ICD-10-CM | POA: Diagnosis present

## 2013-08-09 DIAGNOSIS — I739 Peripheral vascular disease, unspecified: Secondary | ICD-10-CM | POA: Diagnosis present

## 2013-08-09 DIAGNOSIS — N039 Chronic nephritic syndrome with unspecified morphologic changes: Secondary | ICD-10-CM

## 2013-08-09 DIAGNOSIS — Z79899 Other long term (current) drug therapy: Secondary | ICD-10-CM

## 2013-08-09 DIAGNOSIS — Z9849 Cataract extraction status, unspecified eye: Secondary | ICD-10-CM

## 2013-08-09 DIAGNOSIS — E875 Hyperkalemia: Secondary | ICD-10-CM | POA: Diagnosis not present

## 2013-08-09 DIAGNOSIS — I959 Hypotension, unspecified: Secondary | ICD-10-CM | POA: Diagnosis present

## 2013-08-09 DIAGNOSIS — I252 Old myocardial infarction: Secondary | ICD-10-CM

## 2013-08-09 DIAGNOSIS — R609 Edema, unspecified: Secondary | ICD-10-CM

## 2013-08-09 DIAGNOSIS — I10 Essential (primary) hypertension: Secondary | ICD-10-CM

## 2013-08-09 DIAGNOSIS — Z86718 Personal history of other venous thrombosis and embolism: Secondary | ICD-10-CM

## 2013-08-09 HISTORY — DX: Chronic kidney disease, unspecified: N18.9

## 2013-08-09 HISTORY — DX: Pneumonia, unspecified organism: J18.9

## 2013-08-09 HISTORY — DX: Type 2 diabetes mellitus without complications: E11.9

## 2013-08-09 HISTORY — DX: Cerebral infarction, unspecified: I63.9

## 2013-08-09 HISTORY — DX: Shortness of breath: R06.02

## 2013-08-09 LAB — CBC WITH DIFFERENTIAL/PLATELET
Basophils Absolute: 0 10*3/uL (ref 0.0–0.1)
Basophils Relative: 0 % (ref 0–1)
Eosinophils Absolute: 0.1 10*3/uL (ref 0.0–0.7)
Eosinophils Relative: 0 % (ref 0–5)
HCT: 32.3 % — ABNORMAL LOW (ref 36.0–46.0)
HEMOGLOBIN: 10.4 g/dL — AB (ref 12.0–15.0)
LYMPHS ABS: 0.7 10*3/uL (ref 0.7–4.0)
Lymphocytes Relative: 6 % — ABNORMAL LOW (ref 12–46)
MCH: 26.7 pg (ref 26.0–34.0)
MCHC: 32.2 g/dL (ref 30.0–36.0)
MCV: 82.8 fL (ref 78.0–100.0)
MONOS PCT: 4 % (ref 3–12)
Monocytes Absolute: 0.5 10*3/uL (ref 0.1–1.0)
NEUTROS PCT: 90 % — AB (ref 43–77)
Neutro Abs: 11.2 10*3/uL — ABNORMAL HIGH (ref 1.7–7.7)
Platelets: 187 10*3/uL (ref 150–400)
RBC: 3.9 MIL/uL (ref 3.87–5.11)
RDW: 14.5 % (ref 11.5–15.5)
WBC: 12.4 10*3/uL — ABNORMAL HIGH (ref 4.0–10.5)

## 2013-08-09 LAB — POCT I-STAT TROPONIN I: Troponin i, poc: 0.08 ng/mL (ref 0.00–0.08)

## 2013-08-09 LAB — BASIC METABOLIC PANEL
BUN: 53 mg/dL — AB (ref 6–23)
CO2: 18 meq/L — AB (ref 19–32)
Calcium: 9.1 mg/dL (ref 8.4–10.5)
Chloride: 110 mEq/L (ref 96–112)
Creatinine, Ser: 2.9 mg/dL — ABNORMAL HIGH (ref 0.50–1.10)
GFR calc Af Amer: 18 mL/min — ABNORMAL LOW (ref >90)
GFR, EST NON AFRICAN AMERICAN: 15 mL/min — AB (ref >90)
GLUCOSE: 186 mg/dL — AB (ref 70–99)
POTASSIUM: 4.6 meq/L (ref 3.7–5.3)
Sodium: 146 mEq/L (ref 137–147)

## 2013-08-09 LAB — PRO B NATRIURETIC PEPTIDE: Pro B Natriuretic peptide (BNP): 22793 pg/mL — ABNORMAL HIGH (ref 0–125)

## 2013-08-09 LAB — PHOSPHORUS: Phosphorus: 3.6 mg/dL (ref 2.3–4.6)

## 2013-08-09 LAB — MAGNESIUM: Magnesium: 2.1 mg/dL (ref 1.5–2.5)

## 2013-08-09 MED ORDER — FUROSEMIDE 10 MG/ML IJ SOLN
40.0000 mg | Freq: Every day | INTRAMUSCULAR | Status: DC
  Administered 2013-08-10 (×2): 40 mg via INTRAVENOUS
  Filled 2013-08-09 (×3): qty 4

## 2013-08-09 MED ORDER — ONDANSETRON HCL 4 MG/2ML IJ SOLN
4.0000 mg | Freq: Four times a day (QID) | INTRAMUSCULAR | Status: DC | PRN
  Administered 2013-08-11 – 2013-08-23 (×4): 4 mg via INTRAVENOUS
  Filled 2013-08-09 (×4): qty 2

## 2013-08-09 MED ORDER — NITROGLYCERIN 0.4 MG SL SUBL: 0.4000 mg | SUBLINGUAL_TABLET | SUBLINGUAL | Status: DC | PRN

## 2013-08-09 MED ORDER — SODIUM CHLORIDE 0.9 % IJ SOLN
3.0000 mL | Freq: Two times a day (BID) | INTRAMUSCULAR | Status: DC
  Administered 2013-08-10 (×3): 3 mL via INTRAVENOUS

## 2013-08-09 MED ORDER — GLIMEPIRIDE 1 MG PO TABS
0.5000 mg | ORAL_TABLET | Freq: Every day | ORAL | Status: DC
  Administered 2013-08-10 – 2013-08-23 (×12): 0.5 mg via ORAL
  Filled 2013-08-09 (×17): qty 0.5

## 2013-08-09 MED ORDER — LORATADINE 10 MG PO TABS
10.0000 mg | ORAL_TABLET | Freq: Every day | ORAL | Status: DC
  Administered 2013-08-10 – 2013-08-23 (×14): 10 mg via ORAL
  Filled 2013-08-09 (×17): qty 1

## 2013-08-09 MED ORDER — MORPHINE SULFATE 4 MG/ML IJ SOLN
4.0000 mg | Freq: Once | INTRAMUSCULAR | Status: AC
  Administered 2013-08-09: 4 mg via INTRAVENOUS
  Filled 2013-08-09: qty 1

## 2013-08-09 MED ORDER — CALCITRIOL 0.25 MCG PO CAPS
0.2500 ug | ORAL_CAPSULE | ORAL | Status: DC
  Administered 2013-08-11 – 2013-08-23 (×6): 0.25 ug via ORAL
  Filled 2013-08-09 (×11): qty 1

## 2013-08-09 MED ORDER — ASPIRIN 81 MG PO CHEW
81.0000 mg | CHEWABLE_TABLET | Freq: Every day | ORAL | Status: DC
  Administered 2013-08-10 – 2013-08-23 (×13): 81 mg via ORAL
  Filled 2013-08-09 (×13): qty 1

## 2013-08-09 MED ORDER — SODIUM CHLORIDE 0.9 % IV SOLN: 250.0000 mL | INTRAVENOUS | Status: DC | PRN

## 2013-08-09 MED ORDER — INSULIN ASPART 100 UNIT/ML ~~LOC~~ SOLN
0.0000 [IU] | Freq: Three times a day (TID) | SUBCUTANEOUS | Status: DC
  Administered 2013-08-10: 2 [IU] via SUBCUTANEOUS
  Administered 2013-08-10: 3 [IU] via SUBCUTANEOUS
  Administered 2013-08-10 – 2013-08-11 (×2): 2 [IU] via SUBCUTANEOUS
  Administered 2013-08-11 – 2013-08-12 (×4): 3 [IU] via SUBCUTANEOUS
  Administered 2013-08-12: 5 [IU] via SUBCUTANEOUS
  Administered 2013-08-13: 3 [IU] via SUBCUTANEOUS
  Administered 2013-08-13: 2 [IU] via SUBCUTANEOUS
  Administered 2013-08-13: 3 [IU] via SUBCUTANEOUS
  Administered 2013-08-14: 2 [IU] via SUBCUTANEOUS
  Administered 2013-08-14 (×2): 3 [IU] via SUBCUTANEOUS
  Administered 2013-08-15: 1 [IU] via SUBCUTANEOUS
  Administered 2013-08-15: 2 [IU] via SUBCUTANEOUS
  Administered 2013-08-15: 1 [IU] via SUBCUTANEOUS
  Administered 2013-08-16: 5 [IU] via SUBCUTANEOUS
  Administered 2013-08-16 – 2013-08-17 (×2): 1 [IU] via SUBCUTANEOUS
  Administered 2013-08-17: 2 [IU] via SUBCUTANEOUS
  Administered 2013-08-17: 5 [IU] via SUBCUTANEOUS
  Administered 2013-08-18: 1 [IU] via SUBCUTANEOUS
  Administered 2013-08-18: 3 [IU] via SUBCUTANEOUS
  Administered 2013-08-18: 2 [IU] via SUBCUTANEOUS
  Administered 2013-08-19: 3 [IU] via SUBCUTANEOUS
  Administered 2013-08-20 – 2013-08-21 (×2): 1 [IU] via SUBCUTANEOUS
  Administered 2013-08-22 – 2013-08-23 (×2): 3 [IU] via SUBCUTANEOUS

## 2013-08-09 MED ORDER — TRAMADOL HCL 50 MG PO TABS
50.0000 mg | ORAL_TABLET | Freq: Four times a day (QID) | ORAL | Status: DC | PRN
  Administered 2013-08-10 – 2013-08-19 (×5): 50 mg via ORAL
  Filled 2013-08-09 (×6): qty 1

## 2013-08-09 MED ORDER — LEVOFLOXACIN 750 MG PO TABS: 750.0000 mg | ORAL_TABLET | ORAL | Status: DC

## 2013-08-09 MED ORDER — HYDROCODONE-ACETAMINOPHEN 5-325 MG PO TABS
1.0000 | ORAL_TABLET | Freq: Four times a day (QID) | ORAL | Status: DC | PRN
  Administered 2013-08-10 – 2013-08-23 (×17): 1 via ORAL
  Filled 2013-08-09 (×17): qty 1

## 2013-08-09 MED ORDER — SODIUM CHLORIDE 0.9 % IJ SOLN: 3.0000 mL | INTRAMUSCULAR | Status: DC | PRN

## 2013-08-09 MED ORDER — LEVOFLOXACIN 750 MG PO TABS
750.0000 mg | ORAL_TABLET | Freq: Once | ORAL | Status: AC
  Administered 2013-08-10: 750 mg via ORAL
  Filled 2013-08-09: qty 1

## 2013-08-09 MED ORDER — FLUTICASONE PROPIONATE 50 MCG/ACT NA SUSP
2.0000 | Freq: Every day | NASAL | Status: DC | PRN
  Filled 2013-08-09: qty 16

## 2013-08-09 MED ORDER — ASPIRIN 81 MG PO TABS: 81.0000 mg | ORAL_TABLET | Freq: Every day | ORAL | Status: DC

## 2013-08-09 MED ORDER — ASPIRIN 325 MG PO TABS
325.0000 mg | ORAL_TABLET | Freq: Once | ORAL | Status: AC
  Administered 2013-08-09: 325 mg via ORAL
  Filled 2013-08-09: qty 1

## 2013-08-09 MED ORDER — CITALOPRAM HYDROBROMIDE 10 MG PO TABS
10.0000 mg | ORAL_TABLET | Freq: Every day | ORAL | Status: DC
  Administered 2013-08-10 – 2013-08-23 (×14): 10 mg via ORAL
  Filled 2013-08-09 (×16): qty 1

## 2013-08-09 MED ORDER — ONDANSETRON HCL 4 MG PO TABS: 4.0000 mg | ORAL_TABLET | Freq: Four times a day (QID) | ORAL | Status: DC | PRN

## 2013-08-09 MED ORDER — SODIUM CHLORIDE 0.9 % IJ SOLN
3.0000 mL | Freq: Two times a day (BID) | INTRAMUSCULAR | Status: DC
  Administered 2013-08-10 – 2013-08-11 (×2): 3 mL via INTRAVENOUS

## 2013-08-09 MED ORDER — HYDRALAZINE HCL 20 MG/ML IJ SOLN: 5.0000 mg | Freq: Four times a day (QID) | INTRAMUSCULAR | Status: DC | PRN

## 2013-08-09 MED ORDER — HYDRALAZINE HCL 10 MG PO TABS
10.0000 mg | ORAL_TABLET | Freq: Four times a day (QID) | ORAL | Status: DC
  Administered 2013-08-10 (×2): 10 mg via ORAL
  Filled 2013-08-09 (×6): qty 1

## 2013-08-09 MED ORDER — LEVOFLOXACIN 500 MG PO TABS
500.0000 mg | ORAL_TABLET | ORAL | Status: AC
  Administered 2013-08-11 – 2013-08-15 (×3): 500 mg via ORAL
  Filled 2013-08-09 (×3): qty 1

## 2013-08-09 MED ORDER — FUROSEMIDE 10 MG/ML IJ SOLN
40.0000 mg | Freq: Once | INTRAMUSCULAR | Status: AC
  Administered 2013-08-09: 40 mg via INTRAVENOUS
  Filled 2013-08-09: qty 4

## 2013-08-09 MED ORDER — HEPARIN SODIUM (PORCINE) 5000 UNIT/ML IJ SOLN
5000.0000 [IU] | Freq: Three times a day (TID) | INTRAMUSCULAR | Status: DC
  Administered 2013-08-10 – 2013-08-23 (×37): 5000 [IU] via SUBCUTANEOUS
  Filled 2013-08-09 (×45): qty 1

## 2013-08-09 MED ORDER — SIMVASTATIN 10 MG PO TABS
10.0000 mg | ORAL_TABLET | Freq: Every day | ORAL | Status: DC
  Administered 2013-08-10 – 2013-08-22 (×12): 10 mg via ORAL
  Filled 2013-08-09 (×13): qty 1

## 2013-08-09 MED ORDER — BIOTENE DRY MOUTH MT LIQD
15.0000 mL | Freq: Two times a day (BID) | OROMUCOSAL | Status: DC
  Administered 2013-08-10 – 2013-08-24 (×27): 15 mL via OROMUCOSAL

## 2013-08-09 NOTE — Progress Notes (Signed)
ANTIBIOTIC CONSULT NOTE - INITIAL  Pharmacy Consult for Levofloxacin Indication: pneumonia  Allergies  Allergen Reactions  . Penicillins Other (See Comments)    Yeast     Patient Measurements: Height: 5\' 3"  (160 cm) Weight: 128 lb (58.06 kg) IBW/kg (Calculated) : 52.4  Vital Signs: Temp: 97.6 F (36.4 C) (02/02 1530) Temp src: Oral (02/02 1530) BP: 180/117 mmHg (02/02 2100) Pulse Rate: 76 (02/02 2121) Intake/Output from previous day:   Intake/Output from this shift:    Labs:  Recent Labs  08/29/2013 1605  WBC 12.4*  HGB 10.4*  PLT 187  CREATININE 2.90*   Estimated Creatinine Clearance: 15.1 ml/min (by C-G formula based on Cr of 2.9). No results found for this basename: VANCOTROUGH, VANCOPEAK, VANCORANDOM, GENTTROUGH, GENTPEAK, GENTRANDOM, TOBRATROUGH, TOBRAPEAK, TOBRARND, AMIKACINPEAK, AMIKACINTROU, AMIKACIN,  in the last 72 hours   Microbiology: No results found for this or any previous visit (from the past 720 hour(s)).  Medical History: Past Medical History  Diagnosis Date  . CAD (coronary artery disease)     s/p CABG  . HLD (hyperlipidemia)   . HTN (hypertension)   . Acute MI   . Lung nodule   . DM type 1 (diabetes mellitus, type 1)   . Back pain   . Anemia   . Allergic rhinitis   . Osteopenia   . Vaginitis   . Ganglion of joint   . Depression   . DVT (deep venous thrombosis)   . Paraplegia   . Anemia   . GERD (gastroesophageal reflux disease)   . Type II or unspecified type diabetes mellitus without mention of complication, uncontrolled 12/03/2011  . Peripheral vascular disease 08/14/12    cancellrd scheduled appointment with Dr. Quay Burow    Medications:  Prescriptions prior to admission  Medication Sig Dispense Refill  . aspirin 81 MG tablet Take 81 mg by mouth daily.        . calcitRIOL (ROCALTROL) 0.25 MCG capsule Take 0.25 mcg by mouth 3 (three) times a week. Take 1 by mouth on Monday, Wednesday and friday      . cetirizine (ZYRTEC)  10 MG tablet Take 10 mg by mouth at bedtime.       . fluticasone (FLONASE) 50 MCG/ACT nasal spray Place 2 sprays into both nostrils daily as needed for allergies or rhinitis.      Marland Kitchen glimepiride (AMARYL) 1 MG tablet Take 0.5 mg by mouth daily with breakfast.      . HYDROcodone-acetaminophen (NORCO/VICODIN) 5-325 MG per tablet Take 1 tablet by mouth every 8 (eight) hours as needed for pain.  15 tablet  0  . insulin aspart (NOVOLOG) 100 UNIT/ML injection Inject 8-10 Units into the skin 2 (two) times daily.      Marland Kitchen losartan (COZAAR) 50 MG tablet Take 50 mg by mouth daily.      . Multiple Vitamins-Minerals (CENTRUM SILVER ADULT 50+ PO) Take 1 tablet by mouth daily.      . nitroGLYCERIN (NITROSTAT) 0.4 MG SL tablet Place 0.4 mg under the tongue every 5 (five) minutes as needed for chest pain.       . pravastatin (PRAVACHOL) 10 MG tablet Take 10 mg by mouth daily.      . SYRINGE-NEEDLE, DISP, 3 ML (TERUMO SURGUARD2 SYRINGE) 25G X 1" 3 ML MISC Inject 1 application into the skin as directed.  100 each  1  . traMADol (ULTRAM) 50 MG tablet Take 50 mg by mouth every 6 (six) hours as needed for moderate pain.      Marland Kitchen  citalopram (CELEXA) 10 MG tablet Take 10 mg by mouth daily.       Assessment: 68 yo F admitted 08/28/2013 with neck pain and SOB.  Pharmacy consulted to dose levofloxacin.  Goal of Therapy:  Renal adjustment of medications  Plan:  Levofloxacin 750 mg PO x 1 then 500 mg Q48h x 2 doses for a total of 5 days of therapy.  [place] 08/11/2013,9:58 PM

## 2013-08-09 NOTE — ED Notes (Signed)
Reports intermittent right side neck pain x 1 week, this morning became worse & constant. Denies injury, mvmt does not make it worse. C/o SOB, non prod cough x 1 day. Reports took NTG X1 this morning wioth minimal relief. States last MI she had neck & arm pain. Denies CP

## 2013-08-09 NOTE — H&P (Signed)
[facility] History and Physical  Veronica Copeland UEA:[id number] DOB: 1945-09-17 DOA: 08/23/2013  Referring physician: ED physician PCP: Cathlean Cower, MD   Chief Complaint: shortness of breath   HPI:  68 y.o. female hx of CAD s/p CABG, DVT not on coumadin, DM, who presented to [facility] ED with main concern of several days duration of progressively worsening shortness of breath, associated with neck pain, woke her up from sleep one night prior to this admission, requiring use of 2 pillows to improve dyspnea. This has also been associated with non productive cough, poor oral intake, malaise. Pt denies chest pain, no specific focal neurological symptoms, no fevers, chills, no recent sick contacts or exposures. She explains she has noted LE swelling worse in the LLE.  In ED, pt is hemodynamically stable, BNP > 22,000, BP 190/70. TRH asked to admit to telemetry bed.     Assessment and Plan: Active Problems:   Acute respiratory failure - likely secondary to CHF exacerbation, possible infiltrate noted on CXR - will treat with lasix and monitor on telemetry unit - will also treat with empiric ABX Levaquin and obtain sputum analysis, culture, urine legionella and strep pneumo - provide oxygen as needed   CHF, acute exacerbation  - this appears to be combines systolic and diastolic failure - will proceed with Lasix 40 mg IV QD and will need to readjust the regimen as indicated - will monitor daily weights, I's and O's - 2 D ECHO ordered, consider cardiology consult in AM   ? PNA - treat with Levaquin until sputum analysis results back and d/c if no longer indicated over the next 24 - 48 hours   HTN, urgency  - started lasix 40 mg IV QD - will also place on Hydralazine 10 mg Q6 hours scheduled and will as hydralazine as needed - readjust the regimen as indicated - stop Losartan due to renal failure   Diabetes Mellitus, uncontrolled and with complications of anemia of chronic disease and CKD stage  II  - will continue Amaryl as per home medical regimen - will place on SSI, check A1C   Acute on chronic renal failure - will have to monitor renal function closely as pt now on Lasix - stop Losartan - BMP in AM   Leukocytosis - could be related to PNA vs from acute CHF exacerbation - will continue  ABX as noted above and will repeat CBC in AM   Anemia of chronic disease  - no signs of active bleeding - CBC in AM  Code Status: Full Family Communication: Pt at bedside Disposition Plan: Admit to telemetry bed    Review of Systems:  Constitutional: Negative for diaphoresis.  HENT: Negative for hearing loss, ear pain, nosebleeds, congestion, sore throat, neck pain, tinnitus and ear discharge.   Eyes: Negative for blurred vision, double vision, photophobia, pain, discharge and redness.  Respiratory: Negative for wheezing and stridor.   Cardiovascular: Negative for claudication.  Gastrointestinal: Negative for heartburn, constipation, blood in stool and melena.  Genitourinary: Negative for dysuria, urgency, frequency, hematuria and flank pain.  Musculoskeletal: Negative for myalgias, back pain, joint pain and falls.  Skin: Negative for itching and rash.  Neurological: Negative for tingling, tremors, sensory change, speech change, focal weakness, loss of consciousness and headaches.  Endo/Heme/Allergies: Negative for environmental allergies and polydipsia. Does not bruise/bleed easily.  Psychiatric/Behavioral: Negative for suicidal ideas. The patient is not nervous/anxious.      Past Medical History  Diagnosis Date  . CAD (coronary  artery disease)     s/p CABG  . HLD (hyperlipidemia)   . HTN (hypertension)   . Acute MI   . Lung nodule   . DM type 1 (diabetes mellitus, type 1)   . Back pain   . Anemia   . Allergic rhinitis   . Osteopenia   . Vaginitis   . Ganglion of joint   . Depression   . DVT (deep venous thrombosis)   . Paraplegia   . Anemia   . GERD (gastroesophageal  reflux disease)   . Type II or unspecified type diabetes mellitus without mention of complication, uncontrolled 12/03/2011  . Peripheral vascular disease 08/14/12    cancellrd scheduled appointment with Dr. Quay Burow    Past Surgical History  Procedure Laterality Date  . Rotator cuff repair    . Cesarean section    . Coronary artery bypass graft  03/2009  . Laminectomy  2010  . Tumor excision      in stomach    Social History:  reports that she has never smoked. She has never used smokeless tobacco. She reports that she does not drink alcohol or use illicit drugs.  Allergies  Allergen Reactions  . Penicillins Other (See Comments)    Yeast     Family History  Problem Relation Age of Onset  . Diabetes    . Hypertension    . Stroke    . Coronary artery disease      Prior to Admission medications   Medication Sig Start Date End Date Taking? Authorizing Provider  aspirin 81 MG tablet Take 81 mg by mouth daily.     Yes Historical Provider, MD  calcitRIOL (ROCALTROL) 0.25 MCG capsule Take 0.25 mcg by mouth 3 (three) times a week. Take 1 by mouth on Monday, Wednesday and friday   Yes Historical Provider, MD  cetirizine (ZYRTEC) 10 MG tablet Take 10 mg by mouth at bedtime.    Yes Historical Provider, MD  fluticasone (FLONASE) 50 MCG/ACT nasal spray Place 2 sprays into both nostrils daily as needed for allergies or rhinitis.   Yes Historical Provider, MD  glimepiride (AMARYL) 1 MG tablet Take 0.5 mg by mouth daily with breakfast.   Yes Historical Provider, MD  HYDROcodone-acetaminophen (NORCO/VICODIN) 5-325 MG per tablet Take 1 tablet by mouth every 8 (eight) hours as needed for pain. 04/20/13  Yes Carmin Muskrat, MD  insulin aspart (NOVOLOG) 100 UNIT/ML injection Inject 8-10 Units into the skin 2 (two) times daily.   Yes Historical Provider, MD  losartan (COZAAR) 50 MG tablet Take 50 mg by mouth daily.   Yes Historical Provider, MD  Multiple Vitamins-Minerals (CENTRUM SILVER ADULT  50+ PO) Take 1 tablet by mouth daily.   Yes Historical Provider, MD  nitroGLYCERIN (NITROSTAT) 0.4 MG SL tablet Place 0.4 mg under the tongue every 5 (five) minutes as needed for chest pain.    Yes Historical Provider, MD  pravastatin (PRAVACHOL) 10 MG tablet Take 10 mg by mouth daily.   Yes Historical Provider, MD  SYRINGE-NEEDLE, DISP, 3 ML (TERUMO SURGUARD2 SYRINGE) 25G X 1" 3 ML MISC Inject 1 application into the skin as directed. 06/15/12  Yes Biagio Borg, MD  traMADol (ULTRAM) 50 MG tablet Take 50 mg by mouth every 6 (six) hours as needed for moderate pain.   Yes Historical Provider, MD  citalopram (CELEXA) 10 MG tablet Take 10 mg by mouth daily.    Historical Provider, MD    Physical Exam: Filed Vitals:  08/14/2013 1700 08/23/2013 1846 08/31/2013 1900 08/14/2013 1930  BP: 191/89 196/78 168/106 191/70  Pulse: 53 97 98 70  Temp:      TempSrc:      Resp: 20 18 24 24   Height:      Weight:      SpO2: 96% 96% 95% 92%    Physical Exam  Constitutional: Appears well-developed and well-nourished. No distress.  HENT: Normocephalic. External right and left ear normal. Oropharynx is clear and moist.  Eyes: Conjunctivae and EOM are normal. PERRLA, no scleral icterus.  Neck: Normal ROM. Neck supple. No JVD. No tracheal deviation. No thyromegaly.  CVS: RRR, S1/S2 +, no murmurs, no gallops, no carotid bruit.  Pulmonary: Effort and breath sounds normal, no stridor, bibasilar crackles  Abdominal: Soft. BS +,  no distension, tenderness, rebound or guarding.  Musculoskeletal: Normal range of motion. +1 bilateral LE edema L > R Lymphadenopathy: No lymphadenopathy noted, cervical, inguinal. Neuro: Alert. Normal reflexes, muscle tone coordination. No cranial nerve deficit. Skin: Skin is warm and dry. No rash noted. Not diaphoretic. No erythema. No pallor.  Psychiatric: Normal mood and affect. Behavior, judgment, thought content normal.   Labs on Admission:  Basic Metabolic Panel:  Recent Labs Lab  08/18/2013 1605  NA 146  K 4.6  CL 110  CO2 18*  GLUCOSE 186*  BUN 53*  CREATININE 2.90*  CALCIUM 9.1   CBC:  Recent Labs Lab 08/17/2013 1605  WBC 12.4*  NEUTROABS 11.2*  HGB 10.4*  HCT 32.3*  MCV 82.8  PLT 187   Radiological Exams on Admission: Dg Chest 2 View  08/08/2013   CLINICAL DATA:  Shortness of breath for 2 days with cough.  EXAM: CHEST  2 VIEW  COMPARISON:  CT scan from 09/06/2010.  Chest x-ray from 04/27/2009.  FINDINGS: AP and lateral views of the chest show bibasilar collapse/ consolidation with probable small bilateral pleural effusions. Interstitial markings are diffusely coarsened with chronic features. The cardio pericardial silhouette is enlarged. Patient is status post CABG. Telemetry leads overlie the chest.  IMPRESSION: Bibasilar atelectasis or infiltrate with probable small bilateral pleural effusions.   Electronically Signed   By: Misty Stanley M.D.   On: 08/08/2013 18:47    EKG: Normal sinus rhythm, no ST/T wave changes  Faye Ramsay, MD  [facility] Pager [phone]  If 7PM-7AM, please contact night-coverage [email or web address] Password [facility] 08/22/2013, 8:03 PM

## 2013-08-09 NOTE — ED Notes (Signed)
C/o right side neck pain & SOB x 2 days. C/o non prod cough. Denies injury. Upon arrival to ED c/o nausea

## 2013-08-09 NOTE — ED Provider Notes (Signed)
CSN: [id number]     Arrival date & time 08/22/2013  1504 History   First MD Initiated Contact with Patient 09/03/2013 1516     Chief Complaint  Patient presents with  . Shortness of Breath  . Neck Injury   (Consider location/radiation/quality/duration/timing/severity/associated sxs/prior Treatment) The history is provided by the patient.  Veronica Copeland is a 68 y.o. female hx of CAD s/p CABG, DVT not on coumadin, DM here with SOB, neck pain. She couldn't sleep last night due to shortness of breath as well as right-sided neck pain. Sheet upon feeling that she couldn't get enough air. Denies any chest pain. She also has some nonproductive cough since yesterday. She said these are similar to her previous heart attack in the past for which she had a CABG. She had DVT previously but not on anticoagulation. Notes that l leg is more swollen today but its been intermittently swollen for the few years. Denies any fevers or chills. Denies any neck injury.    Past Medical History  Diagnosis Date  . CAD (coronary artery disease)     s/p CABG  . HLD (hyperlipidemia)   . HTN (hypertension)   . Acute MI   . Lung nodule   . DM type 1 (diabetes mellitus, type 1)   . Back pain   . Anemia   . Allergic rhinitis   . Osteopenia   . Vaginitis   . Ganglion of joint   . Depression   . DVT (deep venous thrombosis)   . Paraplegia   . Anemia   . GERD (gastroesophageal reflux disease)   . Type II or unspecified type diabetes mellitus without mention of complication, uncontrolled 12/03/2011  . Peripheral vascular disease 08/14/12    cancellrd scheduled appointment with Dr. Quay Burow   Past Surgical History  Procedure Laterality Date  . Rotator cuff repair    . Cesarean section    . Coronary artery bypass graft  03/2009  . Laminectomy  2010  . Tumor excision      in stomach   Family History  Problem Relation Age of Onset  . Diabetes    . Hypertension    . Stroke    . Coronary artery disease      History  Substance Use Topics  . Smoking status: Never Smoker   . Smokeless tobacco: Never Used  . Alcohol Use: No   OB History   Grav Para Term Preterm Abortions TAB SAB Ect Mult Living                 Review of Systems  HENT:       Neck pain   Respiratory: Positive for shortness of breath.     Allergies  Penicillins  Home Medications   Current Outpatient Rx  Name  Route  Sig  Dispense  Refill  . aspirin 81 MG tablet   Oral   Take 81 mg by mouth daily.           . calcitRIOL (ROCALTROL) 0.25 MCG capsule   Oral   Take 0.25 mcg by mouth 3 (three) times a week. Take 1 by mouth on Monday, Wednesday and friday         . cetirizine (ZYRTEC) 10 MG tablet   Oral   Take 10 mg by mouth at bedtime.          . fluticasone (FLONASE) 50 MCG/ACT nasal spray   Each Nare   Place 2 sprays into both nostrils daily  as needed for allergies or rhinitis.         Marland Kitchen glimepiride (AMARYL) 1 MG tablet   Oral   Take 0.5 mg by mouth daily with breakfast.         . HYDROcodone-acetaminophen (NORCO/VICODIN) 5-325 MG per tablet   Oral   Take 1 tablet by mouth every 8 (eight) hours as needed for pain.   15 tablet   0   . insulin aspart (NOVOLOG) 100 UNIT/ML injection   Subcutaneous   Inject 8-10 Units into the skin 2 (two) times daily.         Marland Kitchen losartan (COZAAR) 50 MG tablet   Oral   Take 50 mg by mouth daily.         . Multiple Vitamins-Minerals (CENTRUM SILVER ADULT 50+ PO)   Oral   Take 1 tablet by mouth daily.         . nitroGLYCERIN (NITROSTAT) 0.4 MG SL tablet   Sublingual   Place 0.4 mg under the tongue every 5 (five) minutes as needed for chest pain.          . pravastatin (PRAVACHOL) 10 MG tablet   Oral   Take 10 mg by mouth daily.         . SYRINGE-NEEDLE, DISP, 3 ML (TERUMO SURGUARD2 SYRINGE) 25G X 1" 3 ML MISC   Subcutaneous   Inject 1 application into the skin as directed.   100 each   1   . traMADol (ULTRAM) 50 MG tablet   Oral    Take 50 mg by mouth every 6 (six) hours as needed for moderate pain.         . citalopram (CELEXA) 10 MG tablet   Oral   Take 10 mg by mouth daily.          BP 191/70  Pulse 70  Temp(Src) 97.6 F (36.4 C) (Oral)  Resp 24  Ht 5\' 3"  (1.6 m)  Wt 128 lb (58.06 kg)  BMI 22.68 kg/m2  SpO2 92% Physical Exam  Nursing note and vitals reviewed. Constitutional: She is oriented to person, place, and time.  Chronically ill, slightly tachypneic   HENT:  Head: Normocephalic.  Mouth/Throat: Oropharynx is clear and moist.  Eyes: Conjunctivae are normal. Pupils are equal, round, and reactive to light.  Neck: Neck supple.  Tenderness R SCM, nl ROM. No midline tenderness   Cardiovascular: Normal rate, regular rhythm and normal heart sounds.   Pulmonary/Chest:  Slightly tachypneic, + crackles bilateral bases.   Abdominal: Soft. Bowel sounds are normal. She exhibits no distension. There is no tenderness. There is no rebound and no guarding.  Musculoskeletal: Normal range of motion.  l leg with 1+ edema, no calf tenderness   Neurological: She is alert and oriented to person, place, and time. No cranial nerve deficit. Coordination normal.  Skin: Skin is warm and dry.  Psychiatric: She has a normal mood and affect. Her behavior is normal. Judgment and thought content normal.    ED Course  Procedures (including critical care time) Labs Review Labs Reviewed  CBC WITH DIFFERENTIAL - Abnormal; Notable for the following:    WBC 12.4 (*)    Hemoglobin 10.4 (*)    HCT 32.3 (*)    Neutrophils Relative % 90 (*)    Neutro Abs 11.2 (*)    Lymphocytes Relative 6 (*)    All other components within normal limits  BASIC METABOLIC PANEL - Abnormal; Notable for the following:    CO2  18 (*)    Glucose, Bld 186 (*)    BUN 53 (*)    Creatinine, Ser 2.90 (*)    GFR calc non Af Amer 15 (*)    GFR calc Af Amer 18 (*)    All other components within normal limits  PRO B NATRIURETIC PEPTIDE - Abnormal;  Notable for the following:    Pro B Natriuretic peptide (BNP) 22793.0 (*)    All other components within normal limits  POCT I-STAT TROPONIN I   Imaging Review Dg Chest 2 View  08/28/2013   CLINICAL DATA:  Shortness of breath for 2 days with cough.  EXAM: CHEST  2 VIEW  COMPARISON:  CT scan from 09/06/2010.  Chest x-ray from 04/27/2009.  FINDINGS: AP and lateral views of the chest show bibasilar collapse/ consolidation with probable small bilateral pleural effusions. Interstitial markings are diffusely coarsened with chronic features. The cardio pericardial silhouette is enlarged. Patient is status post CABG. Telemetry leads overlie the chest.  IMPRESSION: Bibasilar atelectasis or infiltrate with probable small bilateral pleural effusions.   Electronically Signed   By: Misty Stanley M.D.   On: 08/11/2013 18:47    EKG Interpretation    Date/Time:  Monday August 09 2013 15:25:55 EST Ventricular Rate:  91 PR Interval:  161 QRS Duration: 98 QT Interval:  371 QTC Calculation: 456 R Axis:   8 Text Interpretation:  Sinus tachycardia Inferior infarct, old abnormal t, consider ischemia, anterolateral lds similar to previous EKG 10/14 Confirmed by KOHUT  MD, STEPHEN ([id number]) on 08/26/2013 4:11:48 PM            MDM   1. Acute venous embolism and thrombosis of unspecified deep vessels of lower extremity   2. Edema    Veronica Copeland is a 68 y.o. female here with SOB, neck pain. Consider ACS vs CHF. Consider DVT vs PE since L leg swollen. Will do DVT study L leg. Will get trop, BNP.   5:15 PM BNP 22,000. Trop neg. Will call cardiology for new onset CHF. Also has renal failure.   8:09 PM Dr. Radford Pax called back. She states that its the first time she got paged (our Network engineer tried several times in the last 2 hrs). Recommend medicine admission for CHF workup and renal failure. Dr. Olen Pel will admit.    Wandra Arthurs, MD 08/30/2013 2011

## 2013-08-09 NOTE — Progress Notes (Signed)
+  VASCULAR LAB PRELIMINARY  PRELIMINARY  PRELIMINARY  PRELIMINARY  Left leg venous duplex completed    Preliminary report: Left leg venous duplex is abnormal.  The veins at the common femoral area appear to compress.  From the tibial vessels up through the confluence with the common femoral vein the veins are small and do not compress completely.  This could represent chronic DVT findings.  Veins are difficult to visualize.  Osbaldo Mark, RVT 09/03/2013, 5:46 PM

## 2013-08-09 NOTE — ED Notes (Addendum)
Patient transported to vascular lab.

## 2013-08-10 ENCOUNTER — Encounter (HOSPITAL_COMMUNITY): Payer: Self-pay | Admitting: Student

## 2013-08-10 DIAGNOSIS — I509 Heart failure, unspecified: Secondary | ICD-10-CM

## 2013-08-10 DIAGNOSIS — IMO0001 Reserved for inherently not codable concepts without codable children: Secondary | ICD-10-CM

## 2013-08-10 DIAGNOSIS — I517 Cardiomegaly: Secondary | ICD-10-CM

## 2013-08-10 DIAGNOSIS — I10 Essential (primary) hypertension: Secondary | ICD-10-CM

## 2013-08-10 DIAGNOSIS — E1165 Type 2 diabetes mellitus with hyperglycemia: Secondary | ICD-10-CM

## 2013-08-10 DIAGNOSIS — N189 Chronic kidney disease, unspecified: Secondary | ICD-10-CM

## 2013-08-10 DIAGNOSIS — D649 Anemia, unspecified: Secondary | ICD-10-CM

## 2013-08-10 LAB — TSH: TSH: 1.119 u[IU]/mL (ref 0.350–4.500)

## 2013-08-10 LAB — BASIC METABOLIC PANEL
BUN: 53 mg/dL — ABNORMAL HIGH (ref 6–23)
CALCIUM: 8.6 mg/dL (ref 8.4–10.5)
CHLORIDE: 105 meq/L (ref 96–112)
CO2: 16 mEq/L — ABNORMAL LOW (ref 19–32)
Creatinine, Ser: 2.95 mg/dL — ABNORMAL HIGH (ref 0.50–1.10)
GFR calc Af Amer: 18 mL/min — ABNORMAL LOW (ref >90)
GFR calc non Af Amer: 15 mL/min — ABNORMAL LOW (ref >90)
Glucose, Bld: 187 mg/dL — ABNORMAL HIGH (ref 70–99)
Potassium: 4.7 mEq/L (ref 3.7–5.3)
SODIUM: 140 meq/L (ref 137–147)

## 2013-08-10 LAB — GLUCOSE, CAPILLARY
GLUCOSE-CAPILLARY: 179 mg/dL — AB (ref 70–99)
GLUCOSE-CAPILLARY: 188 mg/dL — AB (ref 70–99)
Glucose-Capillary: 182 mg/dL — ABNORMAL HIGH (ref 70–99)
Glucose-Capillary: 220 mg/dL — ABNORMAL HIGH (ref 70–99)
Glucose-Capillary: 198 mg/dL — ABNORMAL HIGH (ref 70–99)
Glucose-Capillary: 176 mg/dL — ABNORMAL HIGH (ref 70–99)

## 2013-08-10 LAB — HEMOGLOBIN A1C
HEMOGLOBIN A1C: 7.4 % — AB (ref <5.7)
Mean Plasma Glucose: 166 mg/dL — ABNORMAL HIGH (ref <117)

## 2013-08-10 LAB — LEGIONELLA ANTIGEN, URINE: Legionella Antigen, Urine: NEGATIVE

## 2013-08-10 LAB — HIV ANTIBODY (ROUTINE TESTING W REFLEX): HIV: NONREACTIVE

## 2013-08-10 LAB — CBC
HCT: 28.9 % — ABNORMAL LOW (ref 36.0–46.0)
Hemoglobin: 9.4 g/dL — ABNORMAL LOW (ref 12.0–15.0)
MCH: 26.9 pg (ref 26.0–34.0)
MCHC: 32.5 g/dL (ref 30.0–36.0)
MCV: 82.8 fL (ref 78.0–100.0)
PLATELETS: 187 10*3/uL (ref 150–400)
RBC: 3.49 MIL/uL — AB (ref 3.87–5.11)
RDW: 14.6 % (ref 11.5–15.5)
WBC: 14.2 10*3/uL — AB (ref 4.0–10.5)

## 2013-08-10 LAB — STREP PNEUMONIAE URINARY ANTIGEN: Strep Pneumo Urinary Antigen: NEGATIVE

## 2013-08-10 MED ORDER — ISOSORB DINITRATE-HYDRALAZINE 20-37.5 MG PO TABS
1.0000 | ORAL_TABLET | Freq: Two times a day (BID) | ORAL | Status: DC
  Administered 2013-08-10 – 2013-08-16 (×13): 1 via ORAL
  Filled 2013-08-10 (×15): qty 1

## 2013-08-10 MED ORDER — FUROSEMIDE 10 MG/ML IJ SOLN
40.0000 mg | Freq: Two times a day (BID) | INTRAMUSCULAR | Status: DC
  Administered 2013-08-10 – 2013-08-11 (×2): 40 mg via INTRAVENOUS
  Filled 2013-08-10 (×2): qty 4

## 2013-08-10 MED ORDER — CARVEDILOL 3.125 MG PO TABS
3.1250 mg | ORAL_TABLET | Freq: Two times a day (BID) | ORAL | Status: DC
  Filled 2013-08-10 (×3): qty 1

## 2013-08-10 NOTE — Progress Notes (Signed)
Pt. Arrived to unit alert and oriented. Pt. C/o pain to neck. Pt. Oriented to room and placed on telemetry. CMT notified. No distress noted. Call light placed within reach. RN will continue to monitor pt. For changes in condition. Veronica Copeland, Veronica Copeland

## 2013-08-10 NOTE — Progress Notes (Signed)
2D Echocardiogram has been performed.  Veronica Copeland 08/10/2013, 10:02 AM

## 2013-08-10 NOTE — Care Management Note (Signed)
    Page 1 of 1   08/10/2013     11:17:33 AM   CARE MANAGEMENT NOTE 08/10/2013  Patient:  YTZEL, GUBLER   Account Number:  [id number]  Date Initiated:  08/10/2013  Documentation initiated by:  [facility]  Subjective/Objective Assessment:   68 y.o. female hx of CAD s/p CABG, DVT, DM, who presented with concern of several days duration of progressively worsening SOB//Home alone     Action/Plan:   IV diureses//Home with HH   Anticipated DC Date:  08/13/2013   Anticipated DC Plan:  [place]  CM consult      Choice offered to / List presented to:          [facility] arranged  HH-1 RN  [place]      [place].   Status of service:   Medicare Important Message given?   (If response is "NO", the following Medicare IM given date fields will be blank) Date Medicare IM given:   Date Additional Medicare IM given:    Discharge Disposition:    Per UR Regulation:  Reviewed for med. necessity/level of care/duration of stay  If discussed at [place] of Stay Meetings, dates discussed:    Comments:  08/10/13 Josephine, RN, BSN, [place] [phone] Spoke with pt at bedside regarding discharge planning for [facility]. Offered pt list of home health agencies to choose from.  Pt chose [facility] to render services of RN/PT. Janae Sauce, RN of [facility] notified. No DME needs identified at this time.

## 2013-08-10 NOTE — Evaluation (Signed)
Physical Therapy Evaluation Patient Details Name: Veronica Copeland MRN: [id number] DOB: [birth year] Today's Date: 08/10/2013 Time: 1450-1506 PT Time Calculation (min): 16 min  PT Assessment / Plan / Recommendation History of Present Illness  68 yo female admitted with CHR, probable Pna. Hx of CAD, CABG, MI, DM DVT. Pt lives alone.   Clinical Impression  On eval, pt required Min assist for mobility-only able to take a few steps in room with walker. Demonstrates general weakness, decreased activity tolerance. Mobility also limited by R sided neck pain with activity. Recommend ST rehab at SNF unless pt's mobility improves significantly.     PT Assessment  Patient needs continued PT services    Follow Up Recommendations  SNF (unless pt's mobility improves significantly)    Does the patient have the potential to tolerate intense rehabilitation      Barriers to Discharge        Equipment Recommendations  None recommended by PT    Recommendations for Other Services OT consult   Frequency Min 3X/week    Precautions / Restrictions Precautions Precautions: Fall Precaution Comments: L drop foot. Pt states she wears brace but didn't bring it to hospital Restrictions Weight Bearing Restrictions: No   Pertinent Vitals/Pain R sided neck pain "okay" at rest; "it's hurting" with activity      Mobility  Transfers Overall transfer level: Needs assistance Transfers: Sit to/from Stand Sit to Stand: Min guard General transfer comment: close guard for safety. Increased time.  Ambulation/Gait Ambulation/Gait assistance: Min assist Ambulation Distance (Feet): 5 Feet Assistive device: Rolling walker (2 wheeled) Gait Pattern/deviations: Steppage;Decreased dorsiflexion - left;Step-to pattern General Gait Details: slow gait speed. Fatigues easily. Pt declined to ambulate any farther.     Exercises     PT Diagnosis: Difficulty walking;Abnormality of gait;Generalized weakness;Acute pain  PT  Problem List: Decreased strength;Decreased range of motion;Decreased activity tolerance;Decreased balance;Decreased mobility;Pain PT Treatment Interventions: DME instruction;Gait training;Functional mobility training;Therapeutic activities;Therapeutic exercise;Patient/family education;Balance training     PT Goals(Current goals can be found in the care plan section) Acute Rehab PT Goals Patient Stated Goal: less pain PT Goal Formulation: With patient Time For Goal Achievement: 08/10/2013 Potential to Achieve Goals: Good  Visit Information  Last PT Received On: 08/10/13 Assistance Needed: +1 History of Present Illness: 68 yo female admitted with CHR, probable Pna. Hx of CAD, CABG, MI, DM DVT. Pt lives alone.        Prior [place] expects to be discharged to:: Private residence Living Arrangements: Alone Type of Home: House Home Access: Stairs to enter [employer] of Steps: 6 Entrance Stairs-Rails: Right Home Layout: Two level Alternate Level Stairs-Number of Steps: 1 flight Home Equipment: Walker - 2 wheels;Cane - quad Prior Function Level of Independence: Independent with assistive device(s) Communication Communication: No difficulties    Cognition  Cognition Arousal/Alertness: Awake/alert Behavior During Therapy: WFL for tasks assessed/performed Overall Cognitive Status: Within Functional Limits for tasks assessed    Extremity/Trunk Assessment Upper Extremity Assessment Upper Extremity Assessment: Defer to OT evaluation Lower Extremity Assessment Lower Extremity Assessment: Generalized weakness Cervical / Trunk Assessment Cervical / Trunk Assessment: Other exceptions Cervical / Trunk Exceptions: pt c/o R sided neck pain with movement   Balance    End of Session PT - End of Session Equipment Utilized During Treatment: Gait belt Activity Tolerance: Patient limited by fatigue;Patient limited by pain Patient left: in chair;with  call bell/phone within reach  GP     Weston Anna, MPT Pager: [phone]

## 2013-08-10 NOTE — Progress Notes (Signed)
I visited Veronica Copeland this afternoon.  She says that she has no knowledge of Heart Failure.  She does own a scale and does not weigh at home.  We discussed the importance of daily weights, signs and symptoms of HF, when to call the physician, low sodium diet and fluid restriction.  [name] lives alone.  I will check on resources available to her at the time of discharge.  I will also plan to see her again before discharge to reinforce education.  Carole Binning RN, BSN, PCCN--Heart Failure Navigator

## 2013-08-10 NOTE — Progress Notes (Signed)
[facility] PROGRESS NOTE Interim History: 68 y.o. female hx of CAD s/p CABG, DVT not on coumadin, DM, who presented to [facility] ED with main concern of several days duration of progressively worsening shortness of breath, associated with neck pain, woke her up from sleep one night prior to this admission, requiring use of 2 pillows to improve dyspnea. This has also been associated with non productive cough, poor oral intake, malaise. Pt denies chest pain, no specific focal neurological symptoms, no fevers, chills, no recent sick contacts or exposures. She explains she has noted LE swelling worse in the LLE.   In ED, pt CXR with vascular congestion and presumed PNA, BNP > 22,000, BP 190/70.   Filed Weights   08/26/2013 1530 09/04/2013 2203  Weight: 58.06 kg (128 lb) 56.926 kg (125 lb 8 oz)        Intake/Output Summary (Last 24 hours) at 08/10/13 1251 Last data filed at 08/10/13 0900  Gross per 24 hour  Intake    240 ml  Output      0 ml  Net    240 ml     Assessment/Plan:  Acute respiratory failure with hypoxia - likely secondary to CHF exacerbation and presumed PNA  - continue IV lasix, daily weight, strict I's and O's, daily BMET and bidil -continue levaquin -CXR in am  -PRN oxygen supplementation -still with decrease BS at bases, 1-2++ LE edema and positive JVD -low dose b-blocker added to regimen  CHF, acute exacerbation  - will follow 2-D echo -treatment as above -daily BMET to follow electrolytes and renal function  Presumed PNA  - treat with Levaquin  -follow CXR in am and also follow results of sputum cx's -follow clinical response   HTN, urgency  - started lasix 40 mg IV BID  - Bidil BID -hydralazine PRN - stop Losartan due to acute renal failure   Diabetes Mellitus, uncontrolled and with complications of anemia of chronic disease and CKD stage II  - will continue Amaryl as per home medical regimen  - will continue SSI -A1C pending  Acute on chronic renal  failure  - will have to monitor renal function closely as pt now on IV Lasix  - stop Losartan   Leukocytosis  - could be related to PNA vs demargination from acute CHF exacerbation  - will continue ABX as mentioned above and will repeat CBC in AM to follow WBC's trend   Anemia of chronic disease  - no signs of active bleeding  - Hgb stable -will monitor   Code Status: full  Family Communication: no family at bedside  Disposition Plan: to be determine   Consultants:  None   Procedures: ECHO: pending   Antibiotics:  levaquin   HPI/Subjective: Feeling better; denies CP, fever, chills or any other complaints. Breathing improving   Objective: Filed Vitals:   08/31/2013 2203 08/10/13 0338 08/10/13 0647 08/10/13 0700  BP: 180/82 151/69 127/62 124/78  Pulse: 75 91 95 93  Temp: 98 F (36.7 C)  98.2 F (36.8 C) 97.9 F (36.6 C)  TempSrc: Oral  Oral Oral  Resp: 20   20  Height: 5' 3.75" (1.619 m)     Weight: 56.926 kg (125 lb 8 oz)     SpO2: 95%  100% 100%     Exam:  General: Alert, awake, oriented x3, in no acute distress; still with some SOB, but feeling better overall.  HEENT: No bruits, no goiter. Positive JVD Heart: Regular rate and rhythm, without  murmurs, rubs, gallops.  Lungs: decrease air movement bilaterally at bases, no wheezing Abdomen: Soft, nontender, nondistended, positive bowel sounds.  Neuro: Grossly intact, nonfocal.   Data Reviewed: Basic Metabolic Panel:  Recent Labs Lab 08/08/2013 1605 08/24/2013 2210 08/10/13 0423  NA 146  --  140  K 4.6  --  4.7  CL 110  --  105  CO2 18*  --  16*  GLUCOSE 186*  --  187*  BUN 53*  --  53*  CREATININE 2.90*  --  2.95*  CALCIUM 9.1  --  8.6  MG  --  2.1  --   PHOS  --  3.6  --    CBC:  Recent Labs Lab 08/20/2013 1605 08/10/13 0423  WBC 12.4* 14.2*  NEUTROABS 11.2*  --   HGB 10.4* 9.4*  HCT 32.3* 28.9*  MCV 82.8 82.8  PLT 187 187   BNP (last 3 results)  Recent Labs  08/08/2013 1609  PROBNP  22793.0*   CBG:  Recent Labs Lab 08/10/13 0341 08/10/13 0427 08/10/13 0808 08/10/13 1158  GLUCAP 188* 179* 182* 220*   Studies: Dg Chest 2 View  08/31/2013   CLINICAL DATA:  Shortness of breath for 2 days with cough.  EXAM: CHEST  2 VIEW  COMPARISON:  CT scan from 09/06/2010.  Chest x-ray from 04/27/2009.  FINDINGS: AP and lateral views of the chest show bibasilar collapse/ consolidation with probable small bilateral pleural effusions. Interstitial markings are diffusely coarsened with chronic features. The cardio pericardial silhouette is enlarged. Patient is status post CABG. Telemetry leads overlie the chest.  IMPRESSION: Bibasilar atelectasis or infiltrate with probable small bilateral pleural effusions.   Electronically Signed   By: Misty Stanley M.D.   On: 09/02/2013 18:47    Scheduled Meds: . antiseptic oral rinse  15 mL Mouth Rinse BID  . aspirin  81 mg Oral Daily  . [START ON 08/11/2013] calcitRIOL  0.25 mcg Oral 3 times weekly  . citalopram  10 mg Oral Daily  . furosemide  40 mg Intravenous Q12H  . glimepiride  0.5 mg Oral Q breakfast  . heparin  5,000 Units Subcutaneous Q8H  . insulin aspart  0-9 Units Subcutaneous TID WC  . isosorbide-hydrALAZINE  1 tablet Oral BID  . [START ON 08/11/2013] levofloxacin  500 mg Oral Q48H  . loratadine  10 mg Oral Daily  . simvastatin  10 mg Oral q1800  . sodium chloride  3 mL Intravenous Q12H  . sodium chloride  3 mL Intravenous Q12H   Continuous Infusions:   Time: > 30 minutes  Alliene Klugh  [facility] Pager [phone]. If 8PM-8AM, please contact night-coverage at [email or web address], password [facility] 08/10/2013, 12:51 PM  LOS: 1 day

## 2013-08-11 ENCOUNTER — Inpatient Hospital Stay (HOSPITAL_COMMUNITY): Payer: Medicare Other

## 2013-08-11 DIAGNOSIS — I5041 Acute combined systolic (congestive) and diastolic (congestive) heart failure: Secondary | ICD-10-CM | POA: Diagnosis present

## 2013-08-11 DIAGNOSIS — N179 Acute kidney failure, unspecified: Secondary | ICD-10-CM | POA: Diagnosis present

## 2013-08-11 DIAGNOSIS — D72829 Elevated white blood cell count, unspecified: Secondary | ICD-10-CM | POA: Diagnosis present

## 2013-08-11 DIAGNOSIS — J9600 Acute respiratory failure, unspecified whether with hypoxia or hypercapnia: Secondary | ICD-10-CM | POA: Diagnosis present

## 2013-08-11 DIAGNOSIS — I1310 Hypertensive heart and chronic kidney disease without heart failure, with stage 1 through stage 4 chronic kidney disease, or unspecified chronic kidney disease: Secondary | ICD-10-CM | POA: Diagnosis present

## 2013-08-11 LAB — BASIC METABOLIC PANEL
BUN: 63 mg/dL — ABNORMAL HIGH (ref 6–23)
CO2: 19 meq/L (ref 19–32)
Calcium: 8.5 mg/dL (ref 8.4–10.5)
Chloride: 104 mEq/L (ref 96–112)
Creatinine, Ser: 3.86 mg/dL — ABNORMAL HIGH (ref 0.50–1.10)
GFR calc Af Amer: 13 mL/min — ABNORMAL LOW (ref >90)
GFR calc non Af Amer: 11 mL/min — ABNORMAL LOW (ref >90)
GLUCOSE: 172 mg/dL — AB (ref 70–99)
Potassium: 4.8 mEq/L (ref 3.7–5.3)
SODIUM: 140 meq/L (ref 137–147)

## 2013-08-11 LAB — CBC
HCT: 26.5 % — ABNORMAL LOW (ref 36.0–46.0)
HEMOGLOBIN: 8.4 g/dL — AB (ref 12.0–15.0)
MCH: 26.4 pg (ref 26.0–34.0)
MCHC: 31.7 g/dL (ref 30.0–36.0)
MCV: 83.3 fL (ref 78.0–100.0)
Platelets: 171 10*3/uL (ref 150–400)
RBC: 3.18 MIL/uL — AB (ref 3.87–5.11)
RDW: 14.6 % (ref 11.5–15.5)
WBC: 10.8 10*3/uL — ABNORMAL HIGH (ref 4.0–10.5)

## 2013-08-11 LAB — GLUCOSE, CAPILLARY
GLUCOSE-CAPILLARY: 197 mg/dL — AB (ref 70–99)
GLUCOSE-CAPILLARY: 234 mg/dL — AB (ref 70–99)
Glucose-Capillary: 208 mg/dL — ABNORMAL HIGH (ref 70–99)
Glucose-Capillary: 155 mg/dL — ABNORMAL HIGH (ref 70–99)

## 2013-08-11 LAB — SODIUM, URINE, RANDOM: Sodium, Ur: 60 mEq/L

## 2013-08-11 LAB — CREATININE, URINE, RANDOM: Creatinine, Urine: 42.69 mg/dL

## 2013-08-11 MED ORDER — CARVEDILOL 6.25 MG PO TABS
6.2500 mg | ORAL_TABLET | Freq: Two times a day (BID) | ORAL | Status: DC
  Administered 2013-08-11: 6.25 mg via ORAL
  Filled 2013-08-11 (×2): qty 1

## 2013-08-11 MED ORDER — SODIUM CHLORIDE 0.9 % IV BOLUS (SEPSIS): 250.0000 mL | Freq: Once | INTRAVENOUS | Status: DC

## 2013-08-11 MED ORDER — MILRINONE IN DEXTROSE 20 MG/100ML IV SOLN
0.2500 ug/kg/min | INTRAVENOUS | Status: DC
  Administered 2013-08-11: 0.25 ug/kg/min via INTRAVENOUS
  Filled 2013-08-11 (×2): qty 100

## 2013-08-11 MED ORDER — FUROSEMIDE 10 MG/ML IJ SOLN
80.0000 mg | Freq: Two times a day (BID) | INTRAMUSCULAR | Status: DC
  Administered 2013-08-11 – 2013-08-12 (×2): 80 mg via INTRAVENOUS
  Filled 2013-08-11 (×4): qty 8

## 2013-08-11 NOTE — Progress Notes (Signed)
Pt HR 100-104 this AM per monitor. Pt with frequent PACs, pt pulse per dinamap 54. Radial pulse taken manually by 2 RN and pulse 54-56. Pt with coreg due this AM. Pt in no apparent distress. BP 128/76. K Schorr notified and ordered to hold Coreg this AM. Will continue to monitor. Ronnette Hila, RN

## 2013-08-11 NOTE — Progress Notes (Signed)
I cosign with Laura Caldwell Student RN on all assessments, medication administration, intake and output, etc for this shift. Autumnrose Yore A, RN  

## 2013-08-11 NOTE — Consult Note (Signed)
Advanced Heart Failure Team History and Physical Note   Primary Physician: Dr. Cathlean Cower Primary Cardiologist: Previous Dr. Lia Foyer pt Primary Nephrologist: Dr. Justin Mend  Reason for Admission: SOB  HPI:    Veronica Copeland is a 68 y.o. female hx of CAD s/p CABG (2010), CKD IV (baseline Cr 2.8/GFR 15-20), DVT not on coumadin, DM, HTN and newly diagnosed systolic HF. EF 2011 60-65%  Dr. Olevia Bowens asked [place] to consult.   She has been doing fairly well and reports that she was able to get around well with a walker and perfrom all her ADLs until last Thursday after she had her pneummonia vaccine. She reports that following vaccine that over the next couple of days she noticed that she had SOB and orthopnea. Denies any CP (no CP with prior MI). Lives alone. She reports she follows closely with Dr. Jenny Reichmann and takes her medications as prescribed. Reports that she follows with renal closely and they discussed starting HD or renal transplant however it was placed on hold because she had mass found in R breast and they were concerned cancer, however CT showed incidental mass.   Pertinent labs on admission were pro-BNP 22,000, Cr 2.9, HIV negative, TSH 1.11  SBPs 120-150  Echo 08/10/13: LVEF 20-25% with regional wall motion abnormalities. Moderate RV dysfunction. +restrictive physiology   Review of Systems: [y] = yes, [ ]  = no   General: Weight gain [ ] ; Weight loss [ ] ; Anorexia [ ] ; Fatigue [ Y]; Fever [ ] ; Chills [ ] ; Weakness [ ]   Cardiac: Chest pain/pressure [ ] ; Resting SOB [Y ]; Exertional SOB [Y ]; Orthopnea [ Y]; Pedal Edema [ ] ; Palpitations [ ] ; Syncope [ ] ; Presyncope [ ] ; Paroxysmal nocturnal dyspnea[ ]   Pulmonary: Cough [ ] ; Wheezing[ ] ; Hemoptysis[ ] ; Sputum [ ] ; Snoring [ ]   GI: Vomiting[ ] ; Dysphagia[ ] ; Melena[ ] ; Hematochezia [ ] ; Heartburn[ ] ; Abdominal pain [ ] ; Constipation [ ] ; Diarrhea [ ] ; BRBPR [ ]   GU: Hematuria[ ] ; Dysuria [ ] ; Nocturia[ ]   Vascular: Pain in legs with walking [ ] ; Pain in  feet with lying flat [ ] ; Non-healing sores [ ] ; Stroke [ ] ; TIA [ ] ; Slurred speech [ ] ;  Neuro: Headaches[ ] ; Vertigo[ ] ; Seizures[ ] ; Paresthesias[ ] ;Blurred vision [ ] ; Diplopia [ ] ; Vision changes [ ]   Ortho/Skin: Arthritis [ ] ; Joint pain [ ] ; Muscle pain [ ] ; Joint swelling [ ] ; Back Pain [ ] ; Rash [ ]   Psych: Depression[ ] ; Anxiety[ ]   Heme: Bleeding problems [ ] ; Clotting disorders [ ] ; Anemia [ ]   Endocrine: Diabetes [ ] ; Thyroid dysfunction[ ]   Home Medications Prior to Admission medications   Medication Sig Start Date End Date Taking? Authorizing Provider  aspirin 81 MG tablet Take 81 mg by mouth daily.     Yes Historical Provider, MD  calcitRIOL (ROCALTROL) 0.25 MCG capsule Take 0.25 mcg by mouth 3 (three) times a week. Take 1 by mouth on Monday, Wednesday and friday   Yes Historical Provider, MD  cetirizine (ZYRTEC) 10 MG tablet Take 10 mg by mouth at bedtime.    Yes Historical Provider, MD  fluticasone (FLONASE) 50 MCG/ACT nasal spray Place 2 sprays into both nostrils daily as needed for allergies or rhinitis.   Yes Historical Provider, MD  glimepiride (AMARYL) 1 MG tablet Take 0.5 mg by mouth daily with breakfast.   Yes Historical Provider, MD  HYDROcodone-acetaminophen (NORCO/VICODIN) 5-325 MG per tablet Take 1 tablet by mouth every 8 (eight)  hours as needed for pain. 04/20/13  Yes Carmin Muskrat, MD  insulin aspart (NOVOLOG) 100 UNIT/ML injection Inject 8-10 Units into the skin 2 (two) times daily.   Yes Historical Provider, MD  losartan (COZAAR) 50 MG tablet Take 50 mg by mouth daily.   Yes Historical Provider, MD  Multiple Vitamins-Minerals (CENTRUM SILVER ADULT 50+ PO) Take 1 tablet by mouth daily.   Yes Historical Provider, MD  nitroGLYCERIN (NITROSTAT) 0.4 MG SL tablet Place 0.4 mg under the tongue every 5 (five) minutes as needed for chest pain.    Yes Historical Provider, MD  pravastatin (PRAVACHOL) 10 MG tablet Take 10 mg by mouth daily.   Yes Historical Provider, MD   SYRINGE-NEEDLE, DISP, 3 ML (TERUMO SURGUARD2 SYRINGE) 25G X 1" 3 ML MISC Inject 1 application into the skin as directed. 06/15/12  Yes Biagio Borg, MD  traMADol (ULTRAM) 50 MG tablet Take 50 mg by mouth every 6 (six) hours as needed for moderate pain.   Yes Historical Provider, MD  citalopram (CELEXA) 10 MG tablet Take 10 mg by mouth daily.    Historical Provider, MD    Past Medical History: Past Medical History  Diagnosis Date  . CAD (coronary artery disease)     s/p CABG  . HLD (hyperlipidemia)   . HTN (hypertension)   . Acute MI   . Lung nodule   . Anemia   . Allergic rhinitis   . Osteopenia   . Vaginitis   . Ganglion of joint   . Depression   . DVT (deep venous thrombosis)   . Paraplegia 2010    "used walker since they took vein out of LLE for CABG" (08/24/2013)  . Anemia   . GERD (gastroesophageal reflux disease)   . Peripheral vascular disease 08/14/12    cancellrd scheduled appointment with Dr. Quay Burow  . Pneumonia     "once" (08/29/2013)  . Shortness of breath     "all the time today" (08/18/2013)  . Type II diabetes mellitus   . Stroke 2010  . Chronic kidney disease     "think dialysis is in my future; don't know exactly what the problem is" (08/19/2013)    Past Surgical History: Past Surgical History  Procedure Laterality Date  . Shoulder arthroscopy w/ rotator cuff repair Right   . Cesarean section    . Coronary artery bypass graft  03/2009  . Laminectomy  2010  . Tumor excision      in stomach  . Excisional hemorrhoidectomy  1980's  . Vaginal hysterectomy    . Dilation and curettage of uterus    . Back surgery    . Cataract extraction, bilateral Bilateral     Family History: Family History  Problem Relation Age of Onset  . Diabetes    . Hypertension    . Stroke    . Coronary artery disease      Social History: History   Social History  . Marital Status: Divorced    Spouse Name: N/A    Number of Children: N/A  . Years of Education: N/A    Social History Main Topics  . Smoking status: Never Smoker   . Smokeless tobacco: Never Used  . Alcohol Use: No  . Drug Use: No  . Sexual Activity: No   Other Topics Concern  . None   Social History Narrative  . None    Allergies:  Allergies  Allergen Reactions  . Penicillins Other (See Comments)    Yeast  Objective:    Vital Signs:   Temp:  [97 F (36.1 C)-98 F (36.7 C)] 97.6 F (36.4 C) (02/04 1336) Pulse Rate:  [51-107] 107 (02/04 1336) Resp:  [18] 18 (02/04 1336) BP: (112-150)/(66-91) 150/91 mmHg (02/04 1336) SpO2:  [99 %-100 %] 100 % (02/04 1336) Weight:  [123 lb 1.6 oz (55.838 kg)] 123 lb 1.6 oz (55.838 kg) (02/04 0619) Last BM Date: 08/08/13 Filed Weights   08/10/2013 1530 08/08/2013 2203 08/11/13 [id number]  Weight: 128 lb (58.06 kg) 125 lb 8 oz (56.926 kg) 123 lb 1.6 oz (55.838 kg)    Physical Exam: General:  Elderly appearing. No resp difficulty; sitting in recliner HEENT: normal Neck: supple. JVP 9-10r with prominent CV waves . Carotids 2+ bilat; no bruits. No lymphadenopathy or thryomegaly appreciated. Cor: PMI nondisplaced. Regular rate & rhythm. No rubs, gallops or murmurs. Lungs: clear Abdomen: soft, nontender, + distended. No hepatosplenomegaly. No bruits or masses. Good bowel sounds. Extremities: no cyanosis, clubbing, rash, 1+ LLE edema, no RLE  Neuro: alert & orientedx3, cranial nerves grossly intact. moves all 4 extremities w/o difficulty. Affect pleasant  Telemetry: SR/ST with bigeminy 90-100s ECG: SR 91. Frequent PACsSmall inf qs. Nonspecific ST-T wave abnormalities laterally.    Labs: Basic Metabolic Panel:  Recent Labs Lab 08/10/2013 1605 08/19/2013 2210 08/10/13 0423 08/11/13 0300  NA 146  --  140 140  K 4.6  --  4.7 4.8  CL 110  --  105 104  CO2 18*  --  16* 19  GLUCOSE 186*  --  187* 172*  BUN 53*  --  53* 63*  CREATININE 2.90*  --  2.95* 3.86*  CALCIUM 9.1  --  8.6 8.5  MG  --  2.1  --   --   PHOS  --  3.6  --   --      Liver Function Tests: No results found for this basename: AST, ALT, ALKPHOS, BILITOT, PROT, ALBUMIN,  in the last 168 hours No results found for this basename: LIPASE, AMYLASE,  in the last 168 hours No results found for this basename: AMMONIA,  in the last 168 hours  CBC:  Recent Labs Lab 09/04/2013 1605 08/10/13 0423 08/11/13 0300  WBC 12.4* 14.2* 10.8*  NEUTROABS 11.2*  --   --   HGB 10.4* 9.4* 8.4*  HCT 32.3* 28.9* 26.5*  MCV 82.8 82.8 83.3  PLT 187 187 171    Cardiac Enzymes: No results found for this basename: CKTOTAL, CKMB, CKMBINDEX, TROPONINI,  in the last 168 hours  BNP: BNP (last 3 results)  Recent Labs  08/31/2013 1609  PROBNP 22793.0*    CBG:  Recent Labs Lab 08/10/13 1158 08/10/13 1628 08/10/13 2147 08/11/13 0612 08/11/13 1104  GLUCAP 220* 198* 176* 197* 234*    Coagulation Studies: No results found for this basename: LABPROT, INR,  in the last 72 hours   Imaging: Dg Chest 2 View  08/11/2013   CLINICAL DATA:  Shortness of breath, infiltrates.  EXAM: CHEST  2 VIEW  COMPARISON:  DG CHEST 2 VIEW dated 08/21/2013; CT CHEST W/O CM dated 09/06/2010  FINDINGS: Trachea is midline. Heart size stable. Bibasilar airspace disease persists. Small bilateral pleural effusions.  IMPRESSION: Persistent bibasilar airspace disease and small bilateral pleural effusions. Findings may be due to pneumonia.   Electronically Signed   By: Lorin Picket M.D.   On: 08/11/2013 07:40   Dg Chest 2 View  08/16/2013   CLINICAL DATA:  Shortness of breath for 2 days  with cough.  EXAM: CHEST  2 VIEW  COMPARISON:  CT scan from 09/06/2010.  Chest x-ray from 04/27/2009.  FINDINGS: AP and lateral views of the chest show bibasilar collapse/ consolidation with probable small bilateral pleural effusions. Interstitial markings are diffusely coarsened with chronic features. The cardio pericardial silhouette is enlarged. Patient is status post CABG. Telemetry leads overlie the chest.  IMPRESSION:  Bibasilar atelectasis or infiltrate with probable small bilateral pleural effusions.   Electronically Signed   By: Misty Stanley M.D.   On: 08/26/2013 18:47         Assessment:   1) Acute combined systolic/diastolic HF - EF 44-81%, grade III DD, RV mildly dilated and sys fx mod reduced 2) Cardiorenal syndrome 3) DM 4) HTN 5) Acute/Chronic kidney failure, stage IV (2.8->3.9)  Plan/Discussion:    Ms. Osterberg is a pleasant 68 yo patient with a prior history of CAD s/p CABG, HTN, CKD stage IV and DM2. She was a previous patient of Dr. Maren Beach and was last seen in 2011, which at that time EF was nl 60-65%. She presented to the ED on 08/29/2013 for SOB with elevated pro-BNP 22,000. Repeat ECHO showed EF 20-25%, grade III DD, and RV mildly dilated with sys fx mod reduced. She is followed by rRnal and last Cr on chart prior to admission was from 10/14 and it was 2.8  She does appear to be volume overloaded with JVP to ear, however did not diurese with lasix 40 mg IV BID. Will hold diuretics currently with increase in Cr to 3.86. Concerned that it is going to be very difficult to manage fluid status with renal function, and likely is suffering from cardiorenal syndrome on top of chronic kidney failure. Recommend consulting Renal.   Will discuss with Dr. Haroldine Laws about RHC to assess hemodynamics. Will not be able to perform LHC with Cr 3.86. SBP 120-150s. Will increase coreg to 6.25 mg BID and continue current dose of  Bidil. May need to switch to hydralazine/nitrates on OP side d/t Bidil cost. Do not want to drop SBP too much for renal perfusion. No ACE-I or Arlyce Harman with CRI.  Follow weights daily and I/Os. HIV negative and TSH nl.   Length of Stay: 2  Rande Brunt NP-C 08/11/2013, 1:47 PM  Advanced Heart Failure Team Pager [phone] (M-F; 7a - 4p)  Please contact [facility] for night-coverage after hours (4p -7a ) and weekends on [email or web address]  Patient seen and examined with Junie Bame,  NP. We discussed all aspects of the encounter. I agree with the assessment and plan as stated above.   Difficult situation. She has new onset HF with severe biventricular dysfunction associated with volume overload and progressive renal failure. I suspect she is liking nearing ESRD due to severe CKD now with overlying cardiorenal syndrome. Several major questions persist including whether her cardiomyopathy is ischemic in nature (my suspicion is it isn't)?, can we improve her with inotropes? and whether she will be a candidate for HD in the face of her cardiomyopathy?  At this point,would start milrinone carefully and see if we can improve her renal perfusion and promote diuresis. No b-blocker, ACE/ARB due to low output and renal failure.   Agree with consulting Renal. She will likely need CVVHD and initiation of HD if felt to be appropriate candidate. Obviously not candidate for coronary angiography at this point 2/2 renal failure:  Plan: 1) start milrinone 2) increase lasix 3) Renal Consult - ?CVVHD/HD 4) Consider RHC  We  will follow.   Yuvraj Pfeifer,MD 7:02 PM

## 2013-08-11 NOTE — Evaluation (Signed)
Occupational Therapy Evaluation Patient Details Name: Veronica Copeland MRN: [id number] DOB: 1945-11-12 Today's Date: 08/11/2013 Time: 1022-1059 OT Time Calculation (min): 37 min  OT Assessment / Plan / Recommendation History of present illness 68 yo female admitted with CHR, probable Pna. Hx of CAD, CABG, MI, DM DVT. Pt lives alone.    Clinical Impression   Pt demos decline in function with ADLs and ADL mobility safety with decreased strength, balance and endurance. Pt would benefit from acute OT services to address impairments to increase level of function and safety. Pt lives at home alone and requires extensive assist for LB ADLs and mobility (decreased balance/safety). Pt's sister lives an hour away in [place]. Unless pt's balance/mobility significantly improves, SNF for short term rehab will be safest d/c option    OT Assessment  Patient needs continued OT Services    Follow Up Recommendations  SNF;Supervision/Assistance - 24 hour    Barriers to Discharge   Pt lives at home alone  Equipment Recommendations  None recommended by OT;Other (comment) (TBD)    Recommendations for Other Services    Frequency  Min 2X/week    Precautions / Restrictions Precautions Precautions: Fall Precaution Comments: L drop foot. Pt states she wears brace but didn't bring it to hospital Restrictions Weight Bearing Restrictions: No   Pertinent Vitals/Pain 10/10 neck before pain meds given 7/10 after pain meds C/o dizziness with standing    ADL  Grooming: Performed;Wash/dry hands;Wash/dry face;Supervision/safety;Set up Where Assessed - Grooming: Unsupported sitting Upper Body Bathing: Simulated;Supervision/safety;Set up Where Assessed - Upper Body Bathing: Unsupported sitting Lower Body Bathing: Simulated;Moderate assistance Upper Body Dressing: Performed;Supervision/safety;Set up Where Assessed - Upper Body Dressing: Unsupported sitting Lower Body Dressing: Performed;Maximal assistance Toilet  Transfer: Minimal assistance;Simulated Toilet Transfer Method: Sit to stand Toileting - Clothing Manipulation and Hygiene: Performed;Moderate assistance;Maximal assistance Where Assessed - Toileting Clothing Manipulation and Hygiene: Standing Tub/Shower Transfer Method: Not assessed Transfers/Ambulation Related to ADLs: increased time, pt unsteady    OT Diagnosis: Generalized weakness;Acute pain  OT Problem List: Decreased activity tolerance;Impaired balance (sitting and/or standing);Pain;Decreased knowledge of use of DME or AE;Decreased strength OT Treatment Interventions: Self-care/ADL training;Therapeutic exercise;Neuromuscular education;Balance training;Patient/family education;Therapeutic activities;DME and/or AE instruction   OT Goals(Current goals can be found in the care plan section) ADL Goals Pt Will Perform Grooming: with min guard assist;with supervision;with set-up;standing Pt Will Perform Lower Body Bathing: with min assist;sitting/lateral leans;sit to/from stand Pt Will Perform Lower Body Dressing: with mod assist;sitting/lateral leans;sit to/from stand Pt Will Transfer to Toilet: with min guard assist;with supervision;bedside commode;grab bars;ambulating;regular height toilet Pt Will Perform Toileting - Clothing Manipulation and hygiene: with mod assist;with min assist;sit to/from stand  Visit Information  Last OT Received On: 08/11/13 Assistance Needed: +1 History of Present Illness: 68 yo female admitted with CHR, probable Pna. Hx of CAD, CABG, MI, DM DVT. Pt lives alone.        Prior [place] expects to be discharged to:: Private residence Living Arrangements: Alone Type of Home: House Home Access: Stairs to enter [employer] of Steps: 6 Entrance Stairs-Rails: Right Home Layout: Multi-level Alternate Level Stairs-Number of Steps: 1 flight Home Equipment: Walker - 2 wheels;Cane - quad Prior Function Level of  Independence: Independent with assistive device(s) Communication Communication: No difficulties Dominant Hand: Right         Vision/Perception Vision - History Baseline Vision: Wears glasses all the time Patient Visual Report: No change from baseline Perception Perception: Within Functional Limits   Cognition  Cognition Arousal/Alertness: Awake/alert Behavior During Therapy: WFL for tasks assessed/performed Overall Cognitive Status: Within Functional Limits for tasks assessed    Extremity/Trunk Assessment Upper Extremity Assessment Upper Extremity Assessment: Overall WFL for tasks assessed;Generalized weakness Lower Extremity Assessment Lower Extremity Assessment: Defer to PT evaluation Cervical / Trunk Assessment Cervical / Trunk Exceptions: pt c/o R sided neck pain with movement     Mobility Bed Mobility Overal bed mobility: Modified Independent Transfers Overall transfer level: Needs assistance Equipment used: Rolling walker (2 wheeled) Transfers: Sit to/from Stand Sit to Stand: Min assist General transfer comment: increased time, pt unsteady          Balance Balance Overall balance assessment: Needs assistance Sitting-balance support: No upper extremity supported;Feet supported Sitting balance-Leahy Scale: Good Standing balance support: Single extremity supported;Bilateral upper extremity supported;During functional activity Standing balance-Leahy Scale: Poor   End of Session OT - End of Session Equipment Utilized During Treatment: Gait belt Activity Tolerance: Patient limited by fatigue;Patient limited by pain Patient left: in chair;with call bell/phone within reach;with family/visitor present  GO     Britt Bottom 08/11/2013, 1:24 PM

## 2013-08-11 NOTE — Progress Notes (Addendum)
[facility] PROGRESS NOTE Interim History: 68 y.o. female hx of CAD s/p CABG, DVT not on coumadin, DM, who presented to [facility] ED with main concern of several days duration of progressively worsening shortness of breath, associated with neck pain, woke her up from sleep one night prior to this admission, requiring use of 2 pillows to improve dyspnea. This has also been associated with non productive cough, poor oral intake, malaise. Pt denies chest pain, no specific focal neurological symptoms, no fevers, chills, no recent sick contacts or exposures. She explains she has noted LE swelling worse in the LLE.   Filed Weights   08/14/2013 1530 08/08/2013 2203 08/11/13 0619  Weight: 58.06 kg (128 lb) 56.926 kg (125 lb 8 oz) 55.838 kg (123 lb 1.6 oz)        Intake/Output Summary (Last 24 hours) at 08/11/13 1310 Last data filed at 08/11/13 0945  Gross per 24 hour  Intake    720 ml  Output   1000 ml  Net   -280 ml    Assessment/Plan: Acute respiratory failure due to  Acute combined systolic and diastolic congestive heart failure/PNA: - On IV lasix,  daily weight, strict I's and O's, daily B-met. - Cont Levaquin, on metoprolol. - Poor documentation of I and O's. - Weight cont to decrease. But creatinine is on the rise, will consult advance heart failure team. - Cont. on coreg and bidil  Cardiorenal syndrome AKI (acute kidney injury): - Due to over diuresis. Hold lasix cont to hold ARB. - NS bolus. b-met in am. - Check renal US  Type II or unspecified type diabetes mellitus without mention of complication, uncontrolled - HbgA1c 7.4, cont SSI and glipizide.  HYPERTENSION - BP stable.   Code Status: Full  Family Communication: Pt at bedside. Disposition Plan: Admit to telemetry bed.   Consultants:  Advance heart failure  Procedures: ECHO [id number]: Systolic function was severely reduced. The estimated ejection fraction was in the range of 20% to 25%. Doppler parameters are  consistent with a restrictive pattern, indicative of decreased left ventricular diastolic compliance and/or increased left atrial pressure (grade 3 diastolic dysfunction). Doppler parameters are consistent with both elevated ventricular end-diastolic filling pressure and elevated left atrial filling pressure.   Antibiotics:  None  HPI/Subjective: Not able to sleep flat  Objective: Filed Vitals:   08/10/13 2054 08/11/13 0122 08/11/13 0619 08/11/13 1020  BP:  132/66 128/76 126/88  Pulse: 97 87 54 94  Temp:  98 F (36.7 C) 97.9 F (36.6 C) 97 F (36.1 C)  TempSrc:  Oral Oral Oral  Resp:  _0 Height:      Weight:   55.838 kg (123 lb 1.6 oz)   SpO2:  100% 100% 100%     Exam: General: Alert, awake, oriented x3, in no acute distress.  HEENT: No bruits, no goiter. + JVD Heart: Regular rate and rhythm, without murmurs, rubs, gallops.  Lungs: Good air movement, crackles at bases bilateral. Lower extremity. Abdomen: Soft, nontender, nondistended, positive bowel sounds.    Data Reviewed: Basic Metabolic Panel:  Recent Labs Lab 08/31/2013 1605 08/10/2013 2210 08/10/13 0423 08/11/13 0300  NA 146  --  140 140  K 4.6  --  4.7 4.8  CL 110  --  105 104  CO2 18*  --  16* 19  GLUCOSE 186*  --  187* 172*  BUN 53*  --  53* 63*  CREATININE 2.90*  --  2.95* 3.86*  CALCIUM 9.1  --  8.6 8.5  MG  --  2.1  --   --   PHOS  --  3.6  --   --    Liver Function Tests: No results found for this basename: AST, ALT, ALKPHOS, BILITOT, PROT, ALBUMIN,  in the last 168 hours No results found for this basename: LIPASE, AMYLASE,  in the last 168 hours No results found for this basename: AMMONIA,  in the last 168 hours CBC:  Recent Labs Lab 08/26/2013 1605 08/10/13 0423 08/11/13 0300  WBC 12.4* 14.2* 10.8*  NEUTROABS 11.2*  --   --   HGB 10.4* 9.4* 8.4*  HCT 32.3* 28.9* 26.5*  MCV 82.8 82.8 83.3  PLT 187 187 171   Cardiac Enzymes: No results found for this basename: CKTOTAL, CKMB,  CKMBINDEX, TROPONINI,  in the last 168 hours BNP (last 3 results)  Recent Labs  08/18/2013 1609  PROBNP 22793.0*   CBG:  Recent Labs Lab 08/10/13 1158 08/10/13 1628 08/10/13 2147 08/11/13 0612 08/11/13 1104  GLUCAP 220* 198* 176* 197* 234*    Recent Results (from the past 240 hour(s))  CULTURE, BLOOD (ROUTINE X 2)     Status: None   Collection Time    09/04/2013 10:10 PM      Result Value Range Status   Specimen Description BLOOD RIGHT ARM   Final   Special Requests BOTTLES DRAWN AEROBIC AND ANAEROBIC 5CC   Final   Culture  Setup Time     Final   Value: 08/10/2013 03:19     Performed at [facility]   Culture     Final   Value:        BLOOD CULTURE RECEIVED NO GROWTH TO DATE CULTURE WILL BE HELD FOR 5 DAYS BEFORE ISSUING A FINAL NEGATIVE REPORT     Performed at [facility]   Report Status PENDING   Incomplete  CULTURE, BLOOD (ROUTINE X 2)     Status: None   Collection Time    09/01/2013 10:15 PM      Result Value Range Status   Specimen Description BLOOD LEFT ARM   Final   Special Requests BOTTLES DRAWN AEROBIC ONLY 10CC   Final   Culture  Setup Time     Final   Value: 08/10/2013 03:19     Performed at [facility]   Culture     Final   Value:        BLOOD CULTURE RECEIVED NO GROWTH TO DATE CULTURE WILL BE HELD FOR 5 DAYS BEFORE ISSUING A FINAL NEGATIVE REPORT     Performed at [facility]   Report Status PENDING   Incomplete     Studies: Dg Chest 2 View  08/11/2013   CLINICAL DATA:  Shortness of breath, infiltrates.  EXAM: CHEST  2 VIEW  COMPARISON:  DG CHEST 2 VIEW dated 08/26/2013; CT CHEST W/O CM dated 09/06/2010  FINDINGS: Trachea is midline. Heart size stable. Bibasilar airspace disease persists. Small bilateral pleural effusions.  IMPRESSION: Persistent bibasilar airspace disease and small bilateral pleural effusions. Findings may be due to pneumonia.   Electronically Signed   By: Lorin Picket M.D.   On: 08/11/2013 07:40   Dg Chest  2 View  08/13/2013   CLINICAL DATA:  Shortness of breath for 2 days with cough.  EXAM: CHEST  2 VIEW  COMPARISON:  CT scan from 09/06/2010.  Chest x-ray from 04/27/2009.  FINDINGS: AP and lateral views of the chest show bibasilar collapse/ consolidation with  probable small bilateral pleural effusions. Interstitial markings are diffusely coarsened with chronic features. The cardio pericardial silhouette is enlarged. Patient is status post CABG. Telemetry leads overlie the chest.  IMPRESSION: Bibasilar atelectasis or infiltrate with probable small bilateral pleural effusions.   Electronically Signed   By: Misty Stanley M.D.   On: 08/21/2013 18:47    Scheduled Meds: . antiseptic oral rinse  15 mL Mouth Rinse BID  . aspirin  81 mg Oral Daily  . calcitRIOL  0.25 mcg Oral 3 times weekly  . carvedilol  3.125 mg Oral BID WC  . citalopram  10 mg Oral Daily  . furosemide  40 mg Intravenous Q12H  . glimepiride  0.5 mg Oral Q breakfast  . heparin  5,000 Units Subcutaneous Q8H  . insulin aspart  0-9 Units Subcutaneous TID WC  . isosorbide-hydrALAZINE  1 tablet Oral BID  . levofloxacin  500 mg Oral Q48H  . loratadine  10 mg Oral Daily  . simvastatin  10 mg Oral q1800  . sodium chloride  3 mL Intravenous Q12H  . sodium chloride  3 mL Intravenous Q12H   Continuous Infusions:    Charlynne Cousins  [facility] Pager [phone]. If 8PM-8AM, please contact night-coverage at [email or web address], password [facility] 08/11/2013, 1:10 PM  LOS: 2 days

## 2013-08-12 DIAGNOSIS — I1310 Hypertensive heart and chronic kidney disease without heart failure, with stage 1 through stage 4 chronic kidney disease, or unspecified chronic kidney disease: Secondary | ICD-10-CM

## 2013-08-12 DIAGNOSIS — I5082 Biventricular heart failure: Secondary | ICD-10-CM | POA: Diagnosis present

## 2013-08-12 DIAGNOSIS — N184 Chronic kidney disease, stage 4 (severe): Secondary | ICD-10-CM | POA: Diagnosis present

## 2013-08-12 DIAGNOSIS — N19 Unspecified kidney failure: Secondary | ICD-10-CM

## 2013-08-12 LAB — GLUCOSE, CAPILLARY
GLUCOSE-CAPILLARY: 145 mg/dL — AB (ref 70–99)
GLUCOSE-CAPILLARY: 190 mg/dL — AB (ref 70–99)
Glucose-Capillary: 214 mg/dL — ABNORMAL HIGH (ref 70–99)
Glucose-Capillary: 248 mg/dL — ABNORMAL HIGH (ref 70–99)
Glucose-Capillary: 260 mg/dL — ABNORMAL HIGH (ref 70–99)

## 2013-08-12 LAB — BASIC METABOLIC PANEL
BUN: 71 mg/dL — AB (ref 6–23)
CHLORIDE: 104 meq/L (ref 96–112)
CO2: 21 meq/L (ref 19–32)
Calcium: 8.4 mg/dL (ref 8.4–10.5)
Creatinine, Ser: 4.34 mg/dL — ABNORMAL HIGH (ref 0.50–1.10)
GFR calc non Af Amer: 10 mL/min — ABNORMAL LOW (ref >90)
GFR, EST AFRICAN AMERICAN: 11 mL/min — AB (ref >90)
GLUCOSE: 231 mg/dL — AB (ref 70–99)
POTASSIUM: 4.9 meq/L (ref 3.7–5.3)
Sodium: 142 mEq/L (ref 137–147)

## 2013-08-12 LAB — IRON AND TIBC
Iron: 47 ug/dL (ref 42–135)
Saturation Ratios: 18 % — ABNORMAL LOW (ref 20–55)
TIBC: 259 ug/dL (ref 250–470)
UIBC: 212 ug/dL (ref 125–400)

## 2013-08-12 LAB — FERRITIN: Ferritin: 221 ng/mL (ref 10–291)

## 2013-08-12 MED ORDER — SENNOSIDES-DOCUSATE SODIUM 8.6-50 MG PO TABS
2.0000 | ORAL_TABLET | Freq: Once | ORAL | Status: AC
  Administered 2013-08-12: 2 via ORAL
  Filled 2013-08-12: qty 2

## 2013-08-12 MED ORDER — FUROSEMIDE 10 MG/ML IJ SOLN
160.0000 mg | Freq: Four times a day (QID) | INTRAVENOUS | Status: DC
  Administered 2013-08-12 – 2013-08-13 (×3): 160 mg via INTRAVENOUS
  Filled 2013-08-12 (×4): qty 16

## 2013-08-12 NOTE — Progress Notes (Signed)
I saw [name] again today.  We discussed briefly her understanding of the information reviewed previously.  She verbalizes understanding.  I reinforce again signs and symptoms of heart failure, when to call the physician, low sodium diet and importance of daily weights.  She is being followed by the heart failure team currently.  Carole Binning RN, BSN, PCCN--Heart Failure Art therapist

## 2013-08-12 NOTE — Consult Note (Signed)
Veronica Copeland is an 68 y.o. female referred by Dr Olevia Bowens   Chief Complaint: Acute on CKD 4 HPI: 68yo BF admitted 08/14/2013 for SOB and cough.  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  According to I/O's she is only negative 680cc.  Renal US unremarkable.  Echo shows EF of 20-30%. Was on losartan prior to admission but has been DC'd.  She denies any uremic Sxs  Past Medical History  Diagnosis Date  . CAD (coronary artery disease)     s/p CABG  . HLD (hyperlipidemia)   . HTN (hypertension)   . Acute MI   . Lung nodule   . Anemia   . Allergic rhinitis   . Osteopenia   . Vaginitis   . Ganglion of joint   . Depression   . DVT (deep venous thrombosis)   . Paraplegia 2010    "used walker since they took vein out of LLE for CABG" (08/08/2013)  . Anemia   . GERD (gastroesophageal reflux disease)   . Peripheral vascular disease 08/14/12    cancellrd scheduled appointment with Dr. Quay Burow  . Pneumonia     "once" (08/12/2013)  . Shortness of breath     "all the time today" (08/31/2013)  . Type II diabetes mellitus   . Stroke 2010  . Chronic kidney disease     "think dialysis is in my future; don't know exactly what the problem is" (08/21/2013)    Past Surgical History  Procedure Laterality Date  . Shoulder arthroscopy w/ rotator cuff repair Right   . Cesarean section    . Coronary artery bypass graft  03/2009  . Laminectomy  2010  . Tumor excision      in stomach  . Excisional hemorrhoidectomy  1980's  . Vaginal hysterectomy    . Dilation and curettage of uterus    . Back surgery    . Cataract extraction, bilateral Bilateral     Family History  Problem Relation Age of Onset  . Diabetes    . Hypertension    . Stroke    . Coronary artery disease    Brother with CKD sec DM  Social History:  reports that she has never smoked. She has never used smokeless tobacco. She reports that she does not drink alcohol or use illicit  drugs.  Allergies:  Allergies  Allergen Reactions  . Penicillins Other (See Comments)    Yeast     Medications Prior to Admission  Medication Sig Dispense Refill  . aspirin 81 MG tablet Take 81 mg by mouth daily.        . calcitRIOL (ROCALTROL) 0.25 MCG capsule Take 0.25 mcg by mouth 3 (three) times a week. Take 1 by mouth on Monday, Wednesday and friday      . cetirizine (ZYRTEC) 10 MG tablet Take 10 mg by mouth at bedtime.       . fluticasone (FLONASE) 50 MCG/ACT nasal spray Place 2 sprays into both nostrils daily as needed for allergies or rhinitis.      Marland Kitchen glimepiride (AMARYL) 1 MG tablet Take 0.5 mg by mouth daily with breakfast.      . HYDROcodone-acetaminophen (NORCO/VICODIN) 5-325 MG per tablet Take 1 tablet by mouth every 8 (eight) hours as needed for pain.  15 tablet  0  . insulin aspart (NOVOLOG) 100 UNIT/ML injection Inject 8-10 Units into the skin 2 (two) times daily.      Marland Kitchen  losartan (COZAAR) 50 MG tablet Take 50 mg by mouth daily.      . Multiple Vitamins-Minerals (CENTRUM SILVER ADULT 50+ PO) Take 1 tablet by mouth daily.      . nitroGLYCERIN (NITROSTAT) 0.4 MG SL tablet Place 0.4 mg under the tongue every 5 (five) minutes as needed for chest pain.       . pravastatin (PRAVACHOL) 10 MG tablet Take 10 mg by mouth daily.      . SYRINGE-NEEDLE, DISP, 3 ML (TERUMO SURGUARD2 SYRINGE) 25G X 1" 3 ML MISC Inject 1 application into the skin as directed.  100 each  1  . traMADol (ULTRAM) 50 MG tablet Take 50 mg by mouth every 6 (six) hours as needed for moderate pain.      . citalopram (CELEXA) 10 MG tablet Take 10 mg by mouth daily.         Lab Results: UA: ND   Recent Labs  08/20/2013 1605 08/10/13 0423 08/11/13 0300  WBC 12.4* 14.2* 10.8*  HGB 10.4* 9.4* 8.4*  HCT 32.3* 28.9* 26.5*  PLT 187 187 171   BMET  Recent Labs  09/01/2013 1605 08/13/2013 2210 08/10/13 0423 08/11/13 0300 08/12/13 1200  NA 146  --  140 140 142  K 4.6  --  4.7 4.8 4.9  CL 110  --  105 104 104   CO2 18*  --  16* 19 21  GLUCOSE 186*  --  187* 172* 231*  BUN 53*  --  53* 63* 71*  CREATININE 2.90*  --  2.95* 3.86* 4.34*  CALCIUM 9.1  --  8.6 8.5 8.4  PHOS  --  3.6  --   --   --    LFT No results found for this basename: PROT, ALBUMIN, AST, ALT, ALKPHOS, BILITOT, BILIDIR, IBILI,  in the last 72 hours Dg Chest 2 View  08/11/2013   CLINICAL DATA:  Shortness of breath, infiltrates.  EXAM: CHEST  2 VIEW  COMPARISON:  DG CHEST 2 VIEW dated 08/26/2013; CT CHEST W/O CM dated 09/06/2010  FINDINGS: Trachea is midline. Heart size stable. Bibasilar airspace disease persists. Small bilateral pleural effusions.  IMPRESSION: Persistent bibasilar airspace disease and small bilateral pleural effusions. Findings may be due to pneumonia.   Electronically Signed   By: Leanna Battles M.D.   On: 08/11/2013 07:40   US Renal  08/11/2013   CLINICAL DATA:  Decreased urine output  EXAM: RENAL/URINARY TRACT ULTRASOUND COMPLETE  COMPARISON:  None.  FINDINGS: Right Kidney:  Length: 9.5 cm. Echogenicity within normal limits. No mass or hydronephrosis visualized.  Left Kidney:  Length: 9.7 cm. Echogenicity within normal limits. No mass or hydronephrosis visualized.  Bladder:  Appears normal for degree of bladder distention.  Note is made of a left-sided pleural effusion.  IMPRESSION: Left pleural effusion.  Normal-appearing kidneys.   Electronically Signed   By: Alcide Clever M.D.   On: 08/11/2013 15:50    ROS: no change in vision No SOB currently Co int Rt neck pain No CP No abd pain No new arthritic or neuropathic CO   PHYSICAL EXAM: Blood pressure 139/67, pulse 54, temperature 97 F (36.1 C), temperature source Oral, resp. rate 20, height 5' 3.75" (1.619 m), weight 56.246 kg (124 lb), SpO2 97.00%. HEENT: PERRLA EOMI NECK:+ JVD No mass on Rt neck, no tenderness  FROM LUNGS:Decreased BS bases with bil crackles CARDIAC:RRR wo MRG ABD:+ BS NTND No HSM EXT:No edema  Brace on Lt leg NEURO:CNI Ox3 No asterixis.  Decreased strength Lt leg and foot  Assessment: 1. Acute on CKD in setting of poor CO affecting renal perfusion 2. CHF 3. Anemia 4. Sec HPTH 5. DM 6. HTN 7. Reportedly benign Rt breast calcification PLAN: 1. Increase lasix to 160mg  q 6hr 2. Check iron studies, if NL then start aranesp 3. Check PTH 4. Renal diet 5. Daily Scr 6.  Hold losartan 7. Discussed possibility of needing HD and she would pursue it if/when needed 8. Check UA and urine studies   Marvene Strohm T 08/12/2013, 4:04 PM

## 2013-08-12 NOTE — Progress Notes (Signed)
Physical Therapy Treatment Patient Details Name: Veronica Copeland MRN: [id number] DOB: September 23, 1945 Today's Date: 08/12/2013 Time: 7425-9563 PT Time Calculation (min): 39 min  PT Assessment / Plan / Recommendation  History of Present Illness 68 yo female admitted with CHF, probable Pna. Hx of CAD, CABG, MI, DM DVT. Pt lives alone.    PT Comments   Pt progressing with gait and mobility and able to maintain 94-96% on RA throughout. Pt limited by right neck pain which she states comes and goes at home as well. Pt encouraged to continue HEP and ambulation daily and continue to recommend SNF as pt not yet able to care for herself and required assist to don AFO today.   Follow Up Recommendations  SNF     Does the patient have the potential to tolerate intense rehabilitation     Barriers to Discharge        Equipment Recommendations       Recommendations for Other Services    Frequency     Progress towards PT Goals Progress towards PT goals: Progressing toward goals  Plan Current plan remains appropriate    Precautions / Restrictions Precautions Precautions: Fall Precaution Comments: L drop foot. wears AFO   Pertinent Vitals/Pain 7/10 right neck pain 94% RA 102 HR   Mobility  Transfers Transfers: Sit to/from Stand Sit to Stand: Supervision General transfer comment: supervision for lines but using armrest for standing x1 from chair and x2 from [facility] Ambulation/Gait Ambulation/Gait assistance: Min guard Ambulation Distance (Feet): 60 Feet Assistive device: Rolling walker (2 wheeled) Gait Pattern/deviations: Step-through pattern;Decreased stride length Gait velocity interpretation: Below normal speed for age/gender General Gait Details: decreased speed with several standing rests related to right neck pain, cueing to step into RW    Exercises General Exercises - Lower Extremity Long Arc Quad: AROM;Seated;Both;20 reps Hip ABduction/ADduction: AROM;Seated;Right;20 reps Hip  Flexion/Marching: AROM;Seated;Both;20 reps   PT Diagnosis:    PT Problem List:   PT Treatment Interventions:     PT Goals (current goals can now be found in the care plan section)    Visit Information  Last PT Received On: 08/12/13 Assistance Needed: +1 History of Present Illness: 68 yo female admitted with CHF, probable Pna. Hx of CAD, CABG, MI, DM DVT. Pt lives alone.     Subjective Data      Cognition  Cognition Arousal/Alertness: Awake/alert Behavior During Therapy: WFL for tasks assessed/performed Overall Cognitive Status: Within Functional Limits for tasks assessed    Balance     End of Session PT - End of Session Equipment Utilized During Treatment: Gait belt;Other (comment) (L AFO) Activity Tolerance: Patient tolerated treatment well Patient left: in chair;with call bell/phone within reach   GP     Lanetta Inch [facility] 08/12/2013, 10:33 AM [name], [place]

## 2013-08-12 NOTE — Progress Notes (Signed)
Patient evaluated for community based chronic disease management services with [place] [facility] as a benefit of patient's [employer]. Spoke with patient at bedside to explain [place] [facility] services.  Patient will receive a post discharge transition of care call and will be evaluated for monthly home visits for assessments and CHF disease process education.    Written consents and alternate contact obtained.  Left contact information and [facility] literature at bedside. Made Inpatient Case Manager aware that [place] [facility] following. Of note, [facility] Care Management services does not replace or interfere with any services that are arranged by inpatient case management or social work.  For additional questions or referrals please contact Corliss Blacker BSN RN [facility] Liaison at [phone].

## 2013-08-12 NOTE — Progress Notes (Signed)
Advanced Heart Failure Rounding Note   Subjective:    Veronica Copeland is a 68 y.o. female hx of CAD s/p CABG (2010), CKD IV (baseline Cr 2.8/GFR 15-20), DVT not on coumadin, DM, HTN and newly diagnosed systolic HF. EF 2011 60-65%   Dr. Olevia Bowens asked [place] to consult.   She has been doing fairly well and reports that she was able to get around well with a walker and perfrom all her ADLs until last Thursday after she had her pneummonia vaccine. She reports that following vaccine that over the next couple of days she noticed that she had SOB and orthopnea. Denies any CP (no CP with prior MI). Lives alone. She reports she follows closely with Dr. Jenny Reichmann and takes her medications as prescribed. Reports that she follows with renal closely and they discussed starting HD or renal transplant however it was placed on hold because she had mass found in R breast and they were concerned cancer, however CT showed incidental mass.   Pertinent labs on admission were pro-BNP 22,000, Cr 2.9, HIV negative, TSH 1.11 SBPs 120-150   Echo 08/10/13:  LVEF 20-25% with regional wall motion abnormalities. Moderate RV dysfunction. +restrictive physiology   Yesterday started on milrinone 0.25. BMET pending. Denies SOB, orthopnea, or CP.    Objective:   Weight Range:  Vital Signs:   Temp:  [97.6 F (36.4 C)-98.7 F (37.1 C)] 97.8 F (36.6 C) (02/05 0542) Pulse Rate:  [82-107] 104 (02/05 0542) Resp:  [18-20] 20 (02/05 0542) BP: (129-150)/(45-91) 147/55 mmHg (02/05 0542) SpO2:  [100 %] 100 % (02/05 0542) Weight:  [124 lb (56.246 kg)] 124 lb (56.246 kg) (02/05 0542) Last BM Date: 08/08/13  Weight change: Filed Weights   08/18/2013 2203 08/11/13 0619 08/12/13 0542  Weight: 125 lb 8 oz (56.926 kg) 123 lb 1.6 oz (55.838 kg) 124 lb (56.246 kg)    Intake/Output:   Intake/Output Summary (Last 24 hours) at 08/12/13 1026 Last data filed at 08/12/13 0951  Gross per 24 hour  Intake    960 ml  Output   1300 ml  Net   -340 ml      Physical Exam: General: Elderly appearing. No resp difficulty; sitting in recliner  HEENT: normal  Neck: supple. JVP 7 with prominent CV waves . Carotids 2+ bilat; no bruits. No lymphadenopathy or thryomegaly appreciated.  Cor: PMI nondisplaced. Regular rate & rhythm. No rubs, gallops or murmurs.  Lungs: clear  Abdomen: soft, nontender,. No hepatosplenomegaly. No bruits or masses. Good bowel sounds.  Extremities: no cyanosis, clubbing, rash, no edema. Brace on LLE edema Neuro: alert & orientedx3, cranial nerves grossly intact. moves all 4 extremities w/o difficulty. Affect pleasant  Telemetry: SR/ST with atrial bigeminy 90-100s   Labs: Basic Metabolic Panel:  Recent Labs Lab 08/26/2013 1605 08/16/2013 2210 08/10/13 0423 08/11/13 0300  NA 146  --  140 140  K 4.6  --  4.7 4.8  CL 110  --  105 104  CO2 18*  --  16* 19  GLUCOSE 186*  --  187* 172*  BUN 53*  --  53* 63*  CREATININE 2.90*  --  2.95* 3.86*  CALCIUM 9.1  --  8.6 8.5  MG  --  2.1  --   --   PHOS  --  3.6  --   --     Liver Function Tests: No results found for this basename: AST, ALT, ALKPHOS, BILITOT, PROT, ALBUMIN,  in the last 168 hours No results found  for this basename: LIPASE, AMYLASE,  in the last 168 hours No results found for this basename: AMMONIA,  in the last 168 hours  CBC:  Recent Labs Lab 08/14/2013 1605 08/10/13 0423 08/11/13 0300  WBC 12.4* 14.2* 10.8*  NEUTROABS 11.2*  --   --   HGB 10.4* 9.4* 8.4*  HCT 32.3* 28.9* 26.5*  MCV 82.8 82.8 83.3  PLT 187 187 171    Cardiac Enzymes: No results found for this basename: CKTOTAL, CKMB, CKMBINDEX, TROPONINI,  in the last 168 hours  BNP: BNP (last 3 results)  Recent Labs  09/01/2013 1609  PROBNP 22793.0*     Other results:  Imaging: Dg Chest 2 View  08/11/2013   CLINICAL DATA:  Shortness of breath, infiltrates.  EXAM: CHEST  2 VIEW  COMPARISON:  DG CHEST 2 VIEW dated 08/22/2013; CT CHEST W/O CM dated 09/06/2010  FINDINGS: Trachea is  midline. Heart size stable. Bibasilar airspace disease persists. Small bilateral pleural effusions.  IMPRESSION: Persistent bibasilar airspace disease and small bilateral pleural effusions. Findings may be due to pneumonia.   Electronically Signed   By: Lorin Picket M.D.   On: 08/11/2013 07:40   US Renal  08/11/2013   CLINICAL DATA:  Decreased urine output  EXAM: RENAL/URINARY TRACT ULTRASOUND COMPLETE  COMPARISON:  None.  FINDINGS: Right Kidney:  Length: 9.5 cm. Echogenicity within normal limits. No mass or hydronephrosis visualized.  Left Kidney:  Length: 9.7 cm. Echogenicity within normal limits. No mass or hydronephrosis visualized.  Bladder:  Appears normal for degree of bladder distention.  Note is made of a left-sided pleural effusion.  IMPRESSION: Left pleural effusion.  Normal-appearing kidneys.   Electronically Signed   By: Inez Catalina M.D.   On: 08/11/2013 15:50      Medications:     Scheduled Medications: . antiseptic oral rinse  15 mL Mouth Rinse BID  . aspirin  81 mg Oral Daily  . calcitRIOL  0.25 mcg Oral 3 times weekly  . citalopram  10 mg Oral Daily  . furosemide  80 mg Intravenous BID  . glimepiride  0.5 mg Oral Q breakfast  . heparin  5,000 Units Subcutaneous Q8H  . insulin aspart  0-9 Units Subcutaneous TID WC  . isosorbide-hydrALAZINE  1 tablet Oral BID  . levofloxacin  500 mg Oral Q48H  . loratadine  10 mg Oral Daily  . simvastatin  10 mg Oral q1800     Infusions: . milrinone 0.25 mcg/kg/min (08/11/13 2240)     PRN Medications:  fluticasone, hydrALAZINE, HYDROcodone-acetaminophen, nitroGLYCERIN, ondansetron (ZOFRAN) IV, ondansetron, traMADol   Assessment:   1) Acute combined systolic/diastolic HF  - EF 01-60%, grade III DD, RV mildly dilated and sys fx mod reduced  2) Cardiorenal syndrome  3) DM  4) HTN  5) Acute/Chronic kidney failure, stage IV (2.8->3.9)   Plan/Discussion:    Length of Stay: 3 Rande Brunt 08/12/2013, 10:26 AM  Advanced  Heart Failure Team Pager [phone] (M-F; 7a - 4p)  Please contact [facility] for night-coverage after hours (4p -7a ) and weekends on [email or web address]  Patient seen and examined with Junie Bame, NP. We discussed all aspects of the encounter. I agree with the assessment and plan as stated above.   Feeling slightly better on milrinone. Weight stable. Await BMET. Suspect she will need initiation of HD soon as tolerated.  We will follow.   Nzinga Ferran,MD 11:02 AM

## 2013-08-12 NOTE — Progress Notes (Signed)
[facility] PROGRESS NOTE Interim History: 67 y.o. female hx of CAD s/p CABG, DVT not on coumadin, DM, who presented to [facility] ED with main concern of several days duration of progressively worsening shortness of breath, associated with neck pain, woke her up from sleep one night prior to this admission, requiring use of 2 pillows to improve dyspnea. This has also been associated with non productive cough, poor oral intake, malaise. Pt denies chest pain, no specific focal neurological symptoms, no fevers, chills, no recent sick contacts or exposures. She explains she has noted LE swelling worse in the LLE.   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 - Cont Levaquin, on metoprolol. - Poor documentation of I and O's. - Appriciate advance heart failure team assitance. - Consult renal for possible HD.  Cardiorenal syndrome CKD IV: - Consult renal   Type II or unspecified type diabetes mellitus without mention of complication, uncontrolled - HbgA1c 7.4, cont SSI and glipizide.  HYPERTENSION - BP stable.   Code Status: Full  Family Communication: Pt at bedside. Disposition Plan: Admit to telemetry bed.   Consultants:  Advance heart failure  Procedures: ECHO 0.1.0932: Systolic function was severely reduced. The estimated ejection fraction was in the range of 20% to 25%. Doppler parameters are consistent with a restrictive pattern, indicative of decreased left ventricular diastolic compliance and/or increased left atrial  pressure (grade 3 diastolic dysfunction). Doppler parameters are consistent with both elevated ventricular end-diastolic filling pressure and elevated left atrial filling pressure.   Antibiotics:  None  HPI/Subjective: Not able to sleep flat  Objective: Filed Vitals:   08/11/13 1336 08/11/13 1954 08/12/13 0141 08/12/13 0542  BP: 150/91 129/60 132/45 147/55  Pulse: 107 82 86 104  Temp: 97.6 F (36.4 C) 98 F (36.7 C) 98.7 F (37.1 C) 97.8 F (36.6 C)  TempSrc: Oral Oral Oral Oral  Resp: $Remo'18 18 18 20  'tVgQX$ Height:      Weight:    56.246 kg (124 lb)  SpO2: 100% 100% 100% 100%     Exam: General: Alert, awake, oriented x3, in no acute distress.  HEENT: No bruits, no goiter. + JVD Heart: Regular rate and rhythm, without murmurs, rubs, gallops.  Lungs: Good air movement, crackles at bases bilateral. Lower extremity. Abdomen: Soft, nontender, nondistended, positive bowel sounds.    Data Reviewed: Basic Metabolic Panel:  Recent Labs Lab 08/12/2013 1605 09/03/2013 2210 08/10/13 0423 08/11/13 0300  NA 146  --  140 140  K 4.6  --  4.7 4.8  CL 110  --  105 104  CO2 18*  --  16* 19  GLUCOSE 186*  --  187* 172*  BUN 53*  --  53* 63*  CREATININE 2.90*  --  2.95* 3.86*  CALCIUM 9.1  --  8.6 8.5  MG  --  2.1  --   --   PHOS  --  3.6  --   --  Liver Function Tests: No results found for this basename: AST, ALT, ALKPHOS, BILITOT, PROT, ALBUMIN,  in the last 168 hours No results found for this basename: LIPASE, AMYLASE,  in the last 168 hours No results found for this basename: AMMONIA,  in the last 168 hours CBC:  Recent Labs Lab 08/28/2013 1605 08/10/13 0423 08/11/13 0300  WBC 12.4* 14.2* 10.8*  NEUTROABS 11.2*  --   --   HGB 10.4* 9.4* 8.4*  HCT 32.3* 28.9* 26.5*  MCV 82.8 82.8 83.3  PLT 187 187 171   Cardiac Enzymes: No results found for this basename: CKTOTAL, CKMB, CKMBINDEX, TROPONINI,  in the last 168 hours BNP (last 3 results)  Recent Labs  08/30/2013 1609   PROBNP 22793.0*   CBG:  Recent Labs Lab 08/11/13 1621 08/11/13 1957 08/12/13 0011 08/12/13 0550 08/12/13 1123  GLUCAP 208* 155* 145* 214* 248*    Recent Results (from the past 240 hour(s))  CULTURE, BLOOD (ROUTINE X 2)     Status: None   Collection Time    08/16/2013 10:10 PM      Result Value Range Status   Specimen Description BLOOD RIGHT ARM   Final   Special Requests BOTTLES DRAWN AEROBIC AND ANAEROBIC 5CC   Final   Culture  Setup Time     Final   Value: 08/10/2013 03:19     Performed at [facility]   Culture     Final   Value:        BLOOD CULTURE RECEIVED NO GROWTH TO DATE CULTURE WILL BE HELD FOR 5 DAYS BEFORE ISSUING A FINAL NEGATIVE REPORT     Performed at [facility]   Report Status PENDING   Incomplete  CULTURE, BLOOD (ROUTINE X 2)     Status: None   Collection Time    09/04/2013 10:15 PM      Result Value Range Status   Specimen Description BLOOD LEFT ARM   Final   Special Requests BOTTLES DRAWN AEROBIC ONLY 10CC   Final   Culture  Setup Time     Final   Value: 08/10/2013 03:19     Performed at [facility]   Culture     Final   Value:        BLOOD CULTURE RECEIVED NO GROWTH TO DATE CULTURE WILL BE HELD FOR 5 DAYS BEFORE ISSUING A FINAL NEGATIVE REPORT     Performed at [facility]   Report Status PENDING   Incomplete     Studies: Dg Chest 2 View  08/11/2013   CLINICAL DATA:  Shortness of breath, infiltrates.  EXAM: CHEST  2 VIEW  COMPARISON:  DG CHEST 2 VIEW dated 09/03/2013; CT CHEST W/O CM dated 09/06/2010  FINDINGS: Trachea is midline. Heart size stable. Bibasilar airspace disease persists. Small bilateral pleural effusions.  IMPRESSION: Persistent bibasilar airspace disease and small bilateral pleural effusions. Findings may be due to pneumonia.   Electronically Signed   By: Leanna Battles M.D.   On: 08/11/2013 07:40   US Renal  08/11/2013   CLINICAL DATA:  Decreased urine output  EXAM: RENAL/URINARY TRACT ULTRASOUND COMPLETE   COMPARISON:  None.  FINDINGS: Right Kidney:  Length: 9.5 cm. Echogenicity within normal limits. No mass or hydronephrosis visualized.  Left Kidney:  Length: 9.7 cm. Echogenicity within normal limits. No mass or hydronephrosis visualized.  Bladder:  Appears normal for degree of bladder distention.  Note is made of a left-sided pleural effusion.  IMPRESSION: Left pleural effusion.  Normal-appearing kidneys.   Electronically Signed   By: Inez Catalina M.D.   On: 08/11/2013 15:50    Scheduled Meds: . antiseptic oral rinse  15 mL Mouth Rinse BID  . aspirin  81 mg Oral Daily  . calcitRIOL  0.25 mcg Oral 3 times weekly  . citalopram  10 mg Oral Daily  . furosemide  80 mg Intravenous BID  . glimepiride  0.5 mg Oral Q breakfast  . heparin  5,000 Units Subcutaneous Q8H  . insulin aspart  0-9 Units Subcutaneous TID WC  . isosorbide-hydrALAZINE  1 tablet Oral BID  . levofloxacin  500 mg Oral Q48H  . loratadine  10 mg Oral Daily  . simvastatin  10 mg Oral q1800   Continuous Infusions: . milrinone 0.25 mcg/kg/min (08/11/13 2240)     Charlynne Cousins  [facility] Pager [phone]. If 8PM-8AM, please contact night-coverage at [email or web address], password [facility] 08/12/2013, 12:31 PM  LOS: 3 days

## 2013-08-12 NOTE — Progress Notes (Signed)
Inpatient Diabetes Program Recommendations  AACE/ADA: New Consensus Statement on Inpatient Glycemic Control (2013)  Target Ranges:  Prepandial:   less than 140 mg/dL      Peak postprandial:   less than 180 mg/dL (1-2 hours)      Critically ill patients:  140 - 180 mg/dL   Post-prandial hyperglycemia (CKD stage IV) Outpatient Diabetes medications:Amaryl 1 mg daily  Current orders for Inpatient glycemic control: Amaryl 0.5 mg and sensitive correction tidwc  Inpatient Diabetes Program Recommendations Insulin - Meal Coverage: May want to add 2 units meal coverage, given only if pt eats >/= 50 % and glucose greater than 120.  Pt take 8-10 unit novolog bid at home. (please d/c Amaryl while here) Oral Agents: Best not to use Amaryl at all while in the hospital as po intake is variable.

## 2013-08-13 DIAGNOSIS — I2510 Atherosclerotic heart disease of native coronary artery without angina pectoris: Secondary | ICD-10-CM

## 2013-08-13 LAB — URINE MICROSCOPIC-ADD ON

## 2013-08-13 LAB — RENAL FUNCTION PANEL
ALBUMIN: 3 g/dL — AB (ref 3.5–5.2)
Albumin: 2.7 g/dL — ABNORMAL LOW (ref 3.5–5.2)
BUN: 73 mg/dL — ABNORMAL HIGH (ref 6–23)
BUN: 74 mg/dL — ABNORMAL HIGH (ref 6–23)
CHLORIDE: 99 meq/L (ref 96–112)
CO2: 22 mEq/L (ref 19–32)
CO2: 22 meq/L (ref 19–32)
CREATININE: 4.39 mg/dL — AB (ref 0.50–1.10)
Calcium: 8.5 mg/dL (ref 8.4–10.5)
Calcium: 9.8 mg/dL (ref 8.4–10.5)
Chloride: 100 mEq/L (ref 96–112)
Creatinine, Ser: 4.57 mg/dL — ABNORMAL HIGH (ref 0.50–1.10)
GFR calc non Af Amer: 9 mL/min — ABNORMAL LOW (ref >90)
GFR, EST AFRICAN AMERICAN: 10 mL/min — AB (ref >90)
GFR, EST AFRICAN AMERICAN: 11 mL/min — AB (ref >90)
GFR, EST NON AFRICAN AMERICAN: 9 mL/min — AB (ref >90)
GLUCOSE: 236 mg/dL — AB (ref 70–99)
Glucose, Bld: 152 mg/dL — ABNORMAL HIGH (ref 70–99)
POTASSIUM: 4.3 meq/L (ref 3.7–5.3)
POTASSIUM: 4.6 meq/L (ref 3.7–5.3)
Phosphorus: 5.5 mg/dL — ABNORMAL HIGH (ref 2.3–4.6)
Phosphorus: 5.8 mg/dL — ABNORMAL HIGH (ref 2.3–4.6)
SODIUM: 140 meq/L (ref 137–147)
Sodium: 139 mEq/L (ref 137–147)

## 2013-08-13 LAB — GLUCOSE, CAPILLARY
GLUCOSE-CAPILLARY: 216 mg/dL — AB (ref 70–99)
GLUCOSE-CAPILLARY: 227 mg/dL — AB (ref 70–99)
Glucose-Capillary: 175 mg/dL — ABNORMAL HIGH (ref 70–99)
Glucose-Capillary: 167 mg/dL — ABNORMAL HIGH (ref 70–99)

## 2013-08-13 LAB — SODIUM, URINE, RANDOM: Sodium, Ur: 91 mEq/L

## 2013-08-13 LAB — URINALYSIS, ROUTINE W REFLEX MICROSCOPIC
Bilirubin Urine: NEGATIVE
Glucose, UA: NEGATIVE mg/dL
Hgb urine dipstick: NEGATIVE
KETONES UR: NEGATIVE mg/dL
Nitrite: NEGATIVE
PH: 5 (ref 5.0–8.0)
Protein, ur: 30 mg/dL — AB
Specific Gravity, Urine: 1.01 (ref 1.005–1.030)
Urobilinogen, UA: 0.2 mg/dL (ref 0.0–1.0)

## 2013-08-13 LAB — PARATHYROID HORMONE, INTACT (NO CA): PTH: 537.8 pg/mL — ABNORMAL HIGH (ref 14.0–72.0)

## 2013-08-13 LAB — HEPATITIS B SURFACE ANTIGEN: HEP B S AG: NEGATIVE

## 2013-08-13 LAB — CREATININE, URINE, RANDOM: Creatinine, Urine: 31.36 mg/dL

## 2013-08-13 MED ORDER — DEXTROSE 5 % IV SOLN
160.0000 mg | Freq: Three times a day (TID) | INTRAVENOUS | Status: DC
  Administered 2013-08-13 – 2013-08-15 (×6): 160 mg via INTRAVENOUS
  Filled 2013-08-13 (×8): qty 16

## 2013-08-13 MED ORDER — SODIUM CHLORIDE 0.9 % IV SOLN
1020.0000 mg | Freq: Once | INTRAVENOUS | Status: AC
  Administered 2013-08-13: 1020 mg via INTRAVENOUS
  Filled 2013-08-13: qty 34

## 2013-08-13 MED ORDER — POLYETHYLENE GLYCOL 3350 17 G PO PACK: 17.0000 g | PACK | Freq: Every day | ORAL | Status: DC

## 2013-08-13 MED ORDER — FUROSEMIDE 10 MG/ML IJ SOLN
160.0000 mg | Freq: Four times a day (QID) | INTRAMUSCULAR | Status: DC
  Filled 2013-08-13: qty 16

## 2013-08-13 MED ORDER — METOLAZONE 5 MG PO TABS
5.0000 mg | ORAL_TABLET | Freq: Every day | ORAL | Status: DC
  Administered 2013-08-13 – 2013-08-16 (×4): 5 mg via ORAL
  Filled 2013-08-13 (×4): qty 1

## 2013-08-13 MED ORDER — POLYETHYLENE GLYCOL 3350 17 G PO PACK
17.0000 g | PACK | Freq: Every day | ORAL | Status: DC | PRN
  Administered 2013-08-13 – 2013-08-20 (×4): 17 g via ORAL
  Filled 2013-08-13 (×3): qty 1

## 2013-08-13 MED ORDER — CALCIUM ACETATE 667 MG PO CAPS
1334.0000 mg | ORAL_CAPSULE | Freq: Three times a day (TID) | ORAL | Status: DC
  Administered 2013-08-13 – 2013-08-24 (×27): 1334 mg via ORAL
  Filled 2013-08-13 (×35): qty 2

## 2013-08-13 NOTE — Progress Notes (Signed)
Medicare Important Message given. Demorris Choyce J. Adarryl Goldammer, RN, BSN, NCM [phone].   

## 2013-08-13 NOTE — Clinical Social Work Psychosocial (Addendum)
Clinical Social Work Department BRIEF PSYCHOSOCIAL ASSESSMENT 08/13/2013  Patient:  Veronica Copeland, Veronica Copeland     Account Number:  [id number]     Admit date:  08/22/2013  Clinical Social Worker:  Iona Coach  Date/Time:  08/12/2013 03:59 PM  Referred by:  Physician  Date Referred:  08/12/2013 Referred for  SNF Placement   Other Referral:   Interview type:  Patient Other interview type:    PSYCHOSOCIAL DATA Living Status:  ALONE Admitted from facility:   Level of care:   Primary support name:  Jaysha Lasure [phone]) Linden Dolin [phone]) Primary support relationship to patient:  CHILD, ADULT Degree of support available:   Kessie Croston, Son [phone])  Emalie Mcwethy, Son [phone])  Woodroe Mode, Sister [phone])    CURRENT CONCERNS Current Concerns  Post-Acute Placement   Other Concerns:    SOCIAL WORK ASSESSMENT / PLAN CSW met with pt at bedside. pt was alert and oriented. CSW explained physical therapies recommendation for SNF. pt stated that she uses a walker to get around her home. Pt also stated that she had been to [facility] before and does not wish to return. Pt was agreeable to the bed search process. CSW placed FL2 on the chart for MD's signature. CSW available for any further questions.   Assessment/plan status:  Psychosocial Support/Ongoing Assessment of Needs Other assessment/ plan:   Information/referral to community resources:    PATIENT'S/FAMILY'S RESPONSE TO PLAN OF CARE: CSW met with pt at bedside. pt was alert and oriented. pt was agreeable to SNF placement and was very thankful for CSWs visit.    Donnella Sham, [place] Intern  [phone]  08/12/13  I have reviewed and agree with above assessment. CSW will follow up with patient and family to assist with SNF placement.  Lorie Phenix. Pauline Good, [place]  SW Transport planner

## 2013-08-13 NOTE — Progress Notes (Signed)
Admit: 08/29/2013 LOS: 4  54F AoCKD in setting of biventricular S/D HF, volume overload with SOB, orthopnea and cough.    Subjective:  Lasix 160 QID started yesterday  HBV sAg neg, HIV neg Weight largely unchanged since admission Sleeping upright in chair, had been on 2pillows for several years but endorses orthopnea Exertional symptoms improved No N/V, itching, hiccus No LEE Feels overall "100% better"   02/05 0701 - 02/06 0700 In: 918.8 [P.O.:790; I.V.:62.8; IV Piggyback:66] Out: 1800 [Urine:1800]  Filed Weights   08/11/13 0619 08/12/13 0542 08/13/13 0649  Weight: 55.838 kg (123 lb 1.6 oz) 56.246 kg (124 lb) 57.335 kg (126 lb 6.4 oz)    Current meds: reviewed  Current Labs: reviewed    Physical Exam:  Blood pressure 143/67, pulse 88, temperature 97.6 F (36.4 C), temperature source Oral, resp. rate 18, height 5' 3.75" (1.619 m), weight 57.335 kg (126 lb 6.4 oz), SpO2 0.00%. NAD, sitting in chair, eating lunch RRR, no s3 or s4 Faint bibasilar crackles, L>R, nl wob No LEE S/nt/nd nabs No asterixus, aaox3 No rashes/lesions  Assessment 1. AoCKD (BL SCr 2s; but was 3.1 07/2012 at [place]) 2/2 cardiorenal syndrome 2. Anemia 3. Biventricular S/D CHF, acute on chronic; LVEF 20-25% + DD 4. Anemia, TSAT 18% 5. 2HTPH,  6. HTN 7. CAD s/p CABG 8. DM  Plan 1. Change lasix frequency to q8h, keep current dose 2. Add metolozone 5mg  PO qAM 3. If fails diuretics (inadequate response -- could try gtt) or further loss of GFR, plan for RRT) 4. Iron load w/ feraheme 1020 IV, was being scheduled as outpt 5. Start PhosLo 2qAC, on calcitriol 0.25 TIW  Pearson Grippe MD 08/13/2013, 12:36 PM   Recent Labs Lab 08/14/2013 1605 08/10/2013 2210  08/11/13 0300 08/12/13 1200 08/13/13 0258  NA 146  --   < > 140 142 140  K 4.6  --   < > 4.8 4.9 4.3  CL 110  --   < > 104 104 100  CO2 18*  --   < > 19 21 22   GLUCOSE 186*  --   < > 172* 231* 236*  BUN 53*  --   < > 63* 71* 73*  CREATININE  2.90*  --   < > 3.86* 4.34* 4.57*  CALCIUM 9.1  --   < > 8.5 8.4 8.5  PHOS  --  3.6  --   --   --  5.5*  < > = values in this interval not displayed.  Recent Labs Lab 08/18/2013 1605 08/10/13 0423 08/11/13 0300  WBC 12.4* 14.2* 10.8*  NEUTROABS 11.2*  --   --   HGB 10.4* 9.4* 8.4*  HCT 32.3* 28.9* 26.5*  MCV 82.8 82.8 83.3  PLT 187 187 171

## 2013-08-13 NOTE — Progress Notes (Signed)
Physical Therapy Treatment Patient Details Name: Veronica Copeland MRN: [id number] DOB: [birth year] Today's Date: 08/13/2013 Time: 3086-5784 PT Time Calculation (min): 23 min  PT Assessment / Plan / Recommendation  History of Present Illness 68 yo female admitted with CHF, probable Pna. Hx of CAD, CABG, MI, DM DVT. Pt lives alone.    PT Comments   Pt progressing with gait but continues to be limited with activity due to right sided neck pain. Pt with several standing rest breaks with gait and denied HEP end of session due to pain. Repositioned and Rn medicated. Will continue to follow and encouraged ambulation with nursing.    Follow Up Recommendations  SNF     Does the patient have the potential to tolerate intense rehabilitation     Barriers to Discharge        Equipment Recommendations       Recommendations for Other Services    Frequency     Progress towards PT Goals Progress towards PT goals: Progressing toward goals (slowly limited by chronic neck pain)  Plan Current plan remains appropriate    Precautions / Restrictions Precautions Precautions: Fall Precaution Comments: L drop foot. wears AFO   Pertinent Vitals/Pain 10/10 right neck pain    Mobility  Transfers Overall transfer level: Modified independent Transfers: Sit to/from Stand Sit to Stand: Modified independent (Device/Increase time) Ambulation/Gait Ambulation/Gait assistance: Supervision Ambulation Distance (Feet): 80 Feet Assistive device: Rolling walker (2 wheeled) Gait Pattern/deviations: Step-through pattern;Decreased stride length General Gait Details: decreased speed with several standing rests related to right neck pain, cueing to step into RW and for upright posture    Exercises     PT Diagnosis:    PT Problem List:   PT Treatment Interventions:     PT Goals (current goals can now be found in the care plan section)    Visit Information  Last PT Received On: 08/13/13 Assistance Needed:  +1 History of Present Illness: 68 yo female admitted with CHF, probable Pna. Hx of CAD, CABG, MI, DM DVT. Pt lives alone.     Subjective Data      Cognition  Cognition Arousal/Alertness: Awake/alert Behavior During Therapy: WFL for tasks assessed/performed Overall Cognitive Status: Within Functional Limits for tasks assessed    Balance     End of Session PT - End of Session Activity Tolerance: Patient limited by pain Patient left: in chair;with call bell/phone within reach;with nursing/sitter in room;with family/visitor present Nurse Communication: Patient requests pain meds;Mobility status   GP     [name] 08/13/2013, 12:27 PM [name], [place]

## 2013-08-13 NOTE — Progress Notes (Signed)
Patient alert and oriented x4 throughout shift.  Patient administered PRN for right neck pain this shift, rated pain a 0 at reassessment.  Patient denying pain or shortness of breath at this time.  Order for PRN miralax obtained as patient states she is feeling constipated.  Patient denying any questions or concerns at this time.  Will continue to monitor.

## 2013-08-13 NOTE — Progress Notes (Signed)
Advanced Heart Failure Rounding Note   Subjective:    Veronica Copeland is a 68 y.o. female hx of CAD s/p CABG (2010), CKD IV (baseline Cr 2.8/GFR 15-20), DVT not on coumadin, DM, HTN and newly diagnosed systolic HF. EF 2011 60-65%   Dr. Olevia Bowens asked [place] to consult.   She has been doing fairly well and reports that she was able to get around well with a walker and perfrom all her ADLs until last Thursday after she had her pneummonia vaccine. She reports that following vaccine that over the next couple of days she noticed that she had SOB and orthopnea. Denies any CP (no CP with prior MI). Lives alone. She reports she follows closely with Dr. Jenny Reichmann and takes her medications as prescribed. Reports that she follows with renal closely and they discussed starting HD or renal transplant however it was placed on hold because she had mass found in R breast and they were concerned cancer, however CT showed incidental mass.   Pertinent labs on admission were pro-BNP 22,000, Cr 2.9, HIV negative, TSH 1.11 SBPs 120-150   Echo 08/10/13:  LVEF 20-25% with regional wall motion abnormalities. Moderate RV dysfunction. +restrictive physiology   Started on milrinone with no improvement. Milrinone stopped.  Denies dyspnea. Renal function continues to deteriorate. Cr 2.9->4.6. Renal managing diuretics.    Objective:   Weight Range:  Vital Signs:   Temp:  [97 F (36.1 C)-98.4 F (36.9 C)] 97.6 F (36.4 C) (02/06 0649) Pulse Rate:  [54-102] 88 (02/06 0938) Resp:  [18-20] 18 (02/06 0938) BP: (139-161)/(56-85) 143/67 mmHg (02/06 0938) SpO2:  [0 %-100 %] 0 % (02/06 0938) Weight:  [57.335 kg (126 lb 6.4 oz)] 57.335 kg (126 lb 6.4 oz) (02/06 0649) Last BM Date: 08/08/13  Weight change: Filed Weights   08/11/13 0619 08/12/13 0542 08/13/13 0649  Weight: 55.838 kg (123 lb 1.6 oz) 56.246 kg (124 lb) 57.335 kg (126 lb 6.4 oz)    Intake/Output:   Intake/Output Summary (Last 24 hours) at 08/13/13 1014 Last data filed at  08/13/13 0942  Gross per 24 hour  Intake 968.79 ml  Output   1825 ml  Net -856.21 ml     Physical Exam: General: Elderly appearing. No resp difficulty; sitting in recliner  HEENT: normal  Neck: supple. JVP 7 with prominent CV waves . Carotids 2+ bilat; no bruits. No lymphadenopathy or thryomegaly appreciated.  Cor: PMI nondisplaced. Regular rate & rhythm. No rubs, gallops or murmurs.  Lungs: clear  Abdomen: soft, nontender,. No hepatosplenomegaly. No bruits or masses. Good bowel sounds.  Extremities: no cyanosis, clubbing, rash, no edema. Brace on LLE edema Neuro: alert & orientedx3, cranial nerves grossly intact. moves all 4 extremities w/o difficulty. Affect pleasant  Telemetry: SR/ST with atrial bigeminy 90-100s   Labs: Basic Metabolic Panel:  Recent Labs Lab 08/30/2013 1605 08/17/2013 2210 08/10/13 0423 08/11/13 0300 08/12/13 1200 08/13/13 0258  NA 146  --  140 140 142 140  K 4.6  --  4.7 4.8 4.9 4.3  CL 110  --  105 104 104 100  CO2 18*  --  16* 19 21 22   GLUCOSE 186*  --  187* 172* 231* 236*  BUN 53*  --  53* 63* 71* 73*  CREATININE 2.90*  --  2.95* 3.86* 4.34* 4.57*  CALCIUM 9.1  --  8.6 8.5 8.4 8.5  MG  --  2.1  --   --   --   --   PHOS  --  3.6  --   --   --  5.5*    Liver Function Tests:  Recent Labs Lab 08/13/13 0258  ALBUMIN 2.7*   No results found for this basename: LIPASE, AMYLASE,  in the last 168 hours No results found for this basename: AMMONIA,  in the last 168 hours  CBC:  Recent Labs Lab 09/03/2013 1605 08/10/13 0423 08/11/13 0300  WBC 12.4* 14.2* 10.8*  NEUTROABS 11.2*  --   --   HGB 10.4* 9.4* 8.4*  HCT 32.3* 28.9* 26.5*  MCV 82.8 82.8 83.3  PLT 187 187 171    Cardiac Enzymes: No results found for this basename: CKTOTAL, CKMB, CKMBINDEX, TROPONINI,  in the last 168 hours  BNP: BNP (last 3 results)  Recent Labs  08/31/2013 1609  PROBNP 22793.0*     Other results:  Imaging: US Renal  08/11/2013   CLINICAL DATA:  Decreased  urine output  EXAM: RENAL/URINARY TRACT ULTRASOUND COMPLETE  COMPARISON:  None.  FINDINGS: Right Kidney:  Length: 9.5 cm. Echogenicity within normal limits. No mass or hydronephrosis visualized.  Left Kidney:  Length: 9.7 cm. Echogenicity within normal limits. No mass or hydronephrosis visualized.  Bladder:  Appears normal for degree of bladder distention.  Note is made of a left-sided pleural effusion.  IMPRESSION: Left pleural effusion.  Normal-appearing kidneys.   Electronically Signed   By: Inez Catalina M.D.   On: 08/11/2013 15:50     Medications:     Scheduled Medications: . antiseptic oral rinse  15 mL Mouth Rinse BID  . aspirin  81 mg Oral Daily  . calcitRIOL  0.25 mcg Oral 3 times weekly  . citalopram  10 mg Oral Daily  . furosemide  160 mg Intravenous Q6H  . glimepiride  0.5 mg Oral Q breakfast  . heparin  5,000 Units Subcutaneous Q8H  . insulin aspart  0-9 Units Subcutaneous TID WC  . isosorbide-hydrALAZINE  1 tablet Oral BID  . levofloxacin  500 mg Oral Q48H  . loratadine  10 mg Oral Daily  . simvastatin  10 mg Oral q1800    Infusions:    PRN Medications: fluticasone, hydrALAZINE, HYDROcodone-acetaminophen, nitroGLYCERIN, ondansetron (ZOFRAN) IV, ondansetron, traMADol   Assessment:   1) Acute combined systolic/diastolic HF  - EF 62-83%, grade III DD, RV mildly dilated and sys fx mod reduced  2) Cardiorenal syndrome  3) DM  4) HTN  5) Acute/Chronic kidney failure, stage IV (2.8->3.9) 6) CAD s/p CABG 2010  Plan/Discussion:    Renal function continues to worsen. Volume status to be managed by Renal. She will likely need coronary angio next week once committed to HD to make sure CAD hasn't progressed. We will see again on Monday. Please call with questions.   Length of Stay: 4 [place] 08/13/2013, 10:14 AM  Advanced Heart Failure Team Pager [phone] (M-F; 7a - 4p)  Please contact [facility] for night-coverage after hours (4p -7a ) and weekends on  [email or web address]

## 2013-08-13 NOTE — Progress Notes (Signed)
Paged nephrologist for clarification about Lasix 160mg  IVPB order as creatinine is 4.57.  Per MD, okay to administer.  Will continue to monitor.

## 2013-08-13 NOTE — Progress Notes (Signed)
Results for CAMIELLE, SIZER (MRN [id number]) as of 08/13/2013 13:17  Ref. Range 08/12/2013 11:23 08/12/2013 16:11 08/12/2013 21:06 08/13/2013 06:31 08/13/2013 10:49  Glucose-Capillary Latest Range: 70-99 mg/dL 248 (H) 260 (H) 190 (H) 175 (H) 216 (H)   Recommend increasing Novolog correction scale to MODERATE AC & HS if CBGs continue greater than 180 mg/dl.  Patient takes Novolog 8-10 units BID at home.   Harvel Ricks RN BSN CDE

## 2013-08-13 NOTE — Progress Notes (Signed)
[facility] PROGRESS NOTE Interim History: 68 y.o. female hx of CAD s/p CABG, DVT not on coumadin, DM, who presented to [facility] ED with main concern of several days duration of progressively worsening shortness of breath, associated with neck pain, woke her up from sleep one night prior to this admission, requiring use of 2 pillows to improve dyspnea. This has also been associated with non productive cough, poor oral intake, malaise. Pt denies chest pain, no specific focal neurological symptoms, no fevers, chills, no recent sick contacts or exposures. She explains she has noted LE swelling worse in the LLE.   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 - Cont metoprolol. Will finish course of antibiotics on 2.7.2014. - Poor documentation of I and O's. - Appriciate advance heart failure team and nephrologyassitance. - Consult renal for possible HD.  Cardiorenal syndrome CKD IV: - Consult renal   Type II or unspecified type diabetes mellitus without mention of complication, uncontrolled - HbgA1c 7.4, cont SSI and glipizide. - well control.  HYPERTENSION - BP stable.   Code Status: Full  Family Communication: Pt at bedside. Disposition Plan: Admit to telemetry bed.   Consultants:  Advance heart failure  Procedures: ECHO 4.1.7408: Systolic function was severely reduced. The estimated ejection fraction was in the range of 20% to 25%. Doppler parameters are consistent with a restrictive  pattern, indicative of decreased left ventricular diastolic compliance and/or increased left atrial pressure (grade 3 diastolic dysfunction). Doppler parameters are consistent with both elevated ventricular end-diastolic filling pressure and elevated left atrial filling pressure.   Antibiotics:  None  HPI/Subjective: Feels better.  Objective: Filed Vitals:   08/12/13 1949 08/12/13 2220 08/13/13 0649 08/13/13 0938  BP: 161/56 144/60 146/85 143/67  Pulse: 102 95 90 88  Temp: 98.4 F (36.9 C)  97.6 F (36.4 C)   TempSrc: Oral  Oral   Resp:   18 18  Height:      Weight:   57.335 kg (126 lb 6.4 oz)   SpO2: 99%  100% 0%     Exam: General: Alert, awake, oriented x3, in no acute distress.  HEENT: No bruits, no goiter. + JVD Heart: Regular rate and rhythm, without murmurs, rubs, gallops.  Lungs: Good air movement, clear to auscultation. Abdomen: Soft, nontender, nondistended, positive bowel sounds.    Data Reviewed: Basic Metabolic Panel:  Recent Labs Lab 08/11/2013 1605 08/14/2013 2210 08/10/13 0423 08/11/13 0300 08/12/13 1200 08/13/13 0258  NA 146  --  140 140 142 140  K 4.6  --  4.7 4.8 4.9 4.3  CL 110  --  105 104 104 100  CO2 18*  --  16* $Re'19 21 22  'owk$ GLUCOSE 186*  --  187* 172* 231* 236*  BUN 53*  --  53* 63* 71* 73*  CREATININE 2.90*  --  2.95* 3.86* 4.34* 4.57*  CALCIUM 9.1  --  8.6 8.5 8.4 8.5  MG  --  2.1  --   --   --   --   PHOS  --  3.6  --   --   --  5.5*   Liver Function Tests:  Recent Labs Lab 08/13/13 0258  ALBUMIN 2.7*   No results found for this basename: LIPASE, AMYLASE,  in the last 168 hours No results found for this basename: AMMONIA,  in the last 168 hours CBC:  Recent Labs Lab 08/18/2013 1605 08/10/13 0423 08/11/13 0300  WBC 12.4* 14.2* 10.8*  NEUTROABS 11.2*  --   --   HGB 10.4* 9.4* 8.4*  HCT 32.3* 28.9* 26.5*  MCV 82.8 82.8 83.3  PLT 187 187 171   Cardiac Enzymes: No results found for this basename: CKTOTAL, CKMB, CKMBINDEX,  TROPONINI,  in the last 168 hours BNP (last 3 results)  Recent Labs  08/08/2013 1609  PROBNP 22793.0*   CBG:  Recent Labs Lab 08/12/13 0550 08/12/13 1123 08/12/13 1611 08/12/13 2106 08/13/13 0631  GLUCAP 214* 248* 260* 190* 175*    Recent Results (from the past 240 hour(s))  CULTURE, BLOOD (ROUTINE X 2)     Status: None   Collection Time    08/24/2013 10:10 PM      Result Value Range Status   Specimen Description BLOOD RIGHT ARM   Final   Special Requests BOTTLES DRAWN AEROBIC AND ANAEROBIC 5CC   Final   Culture  Setup Time     Final   Value: 08/10/2013 03:19     Performed at [facility]   Culture     Final   Value:        BLOOD CULTURE RECEIVED NO GROWTH TO DATE CULTURE WILL BE HELD FOR 5 DAYS BEFORE ISSUING A FINAL NEGATIVE REPORT     Performed at [facility]   Report Status PENDING   Incomplete  CULTURE, BLOOD (ROUTINE X 2)     Status: None   Collection Time    08/21/2013 10:15 PM      Result Value Range Status   Specimen Description BLOOD LEFT ARM   Final   Special Requests BOTTLES DRAWN AEROBIC ONLY 10CC   Final   Culture  Setup Time     Final   Value: 08/10/2013 03:19     Performed at [facility]   Culture     Final   Value:        BLOOD CULTURE RECEIVED NO GROWTH TO DATE CULTURE WILL BE HELD FOR 5 DAYS BEFORE ISSUING A FINAL NEGATIVE REPORT     Performed at [facility]   Report Status PENDING   Incomplete     Studies: US Renal  08/11/2013   CLINICAL DATA:  Decreased urine output  EXAM: RENAL/URINARY TRACT ULTRASOUND COMPLETE  COMPARISON:  None.  FINDINGS: Right Kidney:  Length: 9.5 cm. Echogenicity within normal limits. No mass or hydronephrosis visualized.  Left Kidney:  Length: 9.7 cm. Echogenicity within normal limits. No mass or hydronephrosis visualized.  Bladder:  Appears normal for degree of bladder distention.  Note is made of a left-sided pleural effusion.  IMPRESSION: Left pleural effusion.  Normal-appearing kidneys.    Electronically Signed   By: Inez Catalina M.D.   On: 08/11/2013 15:50    Scheduled Meds: . antiseptic oral rinse  15 mL Mouth Rinse BID  . aspirin  81 mg Oral Daily  . calcitRIOL  0.25 mcg Oral 3 times weekly  . citalopram  10 mg Oral Daily  . furosemide  160 mg Intravenous Q6H  . glimepiride  0.5 mg Oral Q breakfast  . heparin  5,000 Units Subcutaneous Q8H  . insulin aspart  0-9 Units Subcutaneous TID WC  . isosorbide-hydrALAZINE  1 tablet Oral BID  . levofloxacin  500 mg Oral Q48H  . loratadine  10 mg Oral Daily  . simvastatin  10 mg Oral q1800   Continuous Infusions:     Charlynne Cousins  [facility] Pager [phone]. If 8PM-8AM, please contact night-coverage at [email or web address], password [facility] 08/13/2013, 10:47 AM  LOS: 4 days

## 2013-08-14 LAB — RENAL FUNCTION PANEL
ALBUMIN: 3.1 g/dL — AB (ref 3.5–5.2)
BUN: 74 mg/dL — ABNORMAL HIGH (ref 6–23)
CALCIUM: 9.9 mg/dL (ref 8.4–10.5)
CO2: 27 mEq/L (ref 19–32)
CREATININE: 4.49 mg/dL — AB (ref 0.50–1.10)
Chloride: 92 mEq/L — ABNORMAL LOW (ref 96–112)
GFR calc Af Amer: 11 mL/min — ABNORMAL LOW (ref >90)
GFR calc non Af Amer: 9 mL/min — ABNORMAL LOW (ref >90)
GLUCOSE: 193 mg/dL — AB (ref 70–99)
POTASSIUM: 4.4 meq/L (ref 3.7–5.3)
Phosphorus: 5.3 mg/dL — ABNORMAL HIGH (ref 2.3–4.6)
Sodium: 137 mEq/L (ref 137–147)

## 2013-08-14 LAB — GLUCOSE, CAPILLARY
GLUCOSE-CAPILLARY: 222 mg/dL — AB (ref 70–99)
Glucose-Capillary: 209 mg/dL — ABNORMAL HIGH (ref 70–99)
Glucose-Capillary: 170 mg/dL — ABNORMAL HIGH (ref 70–99)
Glucose-Capillary: 97 mg/dL (ref 70–99)

## 2013-08-14 LAB — URINE CULTURE
Colony Count: NO GROWTH
Culture: NO GROWTH

## 2013-08-14 NOTE — Progress Notes (Signed)
Pt. Alert and stable this am. No s/s of distress noted. Pt. C/o of neck pain during the night. PRN pain medication administered. Pt. Resting in bed quietly. Call light within reach. RN will continue to monitor pt. For changes in condition. Veronica Copeland, [name]

## 2013-08-14 NOTE — Progress Notes (Signed)
Patient having c/o chronic right neck pain, PRN Tramadol administered as ordered. Patient currently resting comfortably, will continue to monitor. Tresa Endo

## 2013-08-14 NOTE — Progress Notes (Signed)
Nutrition Education Note  RD consulted for nutrition education regarding a low sodium diet.  RD provided "Low Sodium Nutrition Therapy" handout from the [employer]. Reviewed patient's dietary recall. Provided examples on ways to decrease sodium intake in diet. Discouraged intake of processed foods and use of salt shaker. Encouraged fresh fruits and vegetables as well as whole grain sources of carbohydrates to maximize fiber intake.   RD discussed why it is important for patient to adhere to diet recommendations, and emphasized the role of fluids, foods to avoid, and importance of weighing self daily. Teach back method used. Pt states she cooks at home and already uses [employer]. She will try to limit added salt and will read nutrition labels to choose lower sodium foods.   Expect good compliance.  Body mass index is 20.72 kg/(m^2). Pt meets criteria for Normal Weight based on current BMI. Pt states her usual weight is between 121 lbs and 125 lbs.   Current diet order is Renal 80/90, patient is consuming approximately 50-100% of meals at this time. Labs and medications reviewed. No further nutrition interventions warranted at this time. RD contact information provided. If additional nutrition issues arise, please re-consult RD.   Pryor Ochoa RD, LDN Inpatient Clinical Dietitian Pager: [phone] After Hours Pager: [phone]

## 2013-08-14 NOTE — Progress Notes (Signed)
Admit: 09/04/2013 LOS: 5  20F AoCKD in setting of biventricular S/D HF, volume overload with SOB, orthopnea and cough.    Subjective:  Excellent UOP on TID lasix + Metolazone No SOB, orthopean Good appetitie Slept in bed w/ HOB elevated Weight trending down SCr stable/slightly improved  02/06 0701 - 02/07 0700 In: 8295 [P.O.:1280; IV Piggyback:250] Out: 3750 [AOZHY:8657]  Filed Weights   08/12/13 0542 08/13/13 0649 08/14/13 0506  Weight: 56.246 kg (124 lb) 57.335 kg (126 lb 6.4 oz) 54.3 kg (119 lb 11.4 oz)    Current meds: reviewed  Current Labs: reviewed    Physical Exam:  Blood pressure 152/55, pulse 96, temperature 98 F (36.7 C), temperature source Oral, resp. rate 20, height 5' 3.75" (1.619 m), weight 54.3 kg (119 lb 11.4 oz), SpO2 100.00%. NAD, sitting in chair, eating lunch RRR, no s3 or s4 Faint bibasilar crackles, L>R, nl wob No LEE S/nt/nd nabs No asterixus, aaox3 No rashes/lesions  Assessment 1. AoCKD (BL SCr 2s; but was 3.1 07/2012 at [place]) 2/2 cardiorenal syndrome 2. Anemia. S/p feraheme 1020 08/13/13 3. Biventricular S/D CHF, acute on chronic; LVEF 20-25% + DD 4. 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 HTN, stable 7. CAD s/p CABG 8. DM  Plan 1. Cont TID lasix 160 IV TID + 5mg  PO metolaozone qAM 2. Consider transition to oral regimen tomorrow 3. Dietician to see pt re: Low Na Diet 4. BID Renal panel 5. CBC in AM 6. Hopefully can avoid RRT in near future; will need access planning.  Was interested in PD as outpt.   7. PhosLo 2qAC, on calcitriol 0.25 TIW, watch Ca 8. Might need ESA post IV Fe.   Pearson Grippe MD 08/14/2013, 9:59 AM   Recent Labs Lab 08/20/2013 2210  08/12/13 1200 08/13/13 0258 08/13/13 1958  NA  --   < > 142 140 139  K  --   < > 4.9 4.3 4.6  CL  --   < > 104 100 99  CO2  --   < > 21 22 22   GLUCOSE  --   < > 231* 236* 152*  BUN  --   < > 71* 73* 74*  CREATININE  --   < > 4.34* 4.57*  4.39*  CALCIUM  --   < > 8.4 8.5 9.8  PHOS 3.6  --   --  5.5* 5.8*  < > = values in this interval not displayed.  Recent Labs Lab 08/24/2013 1605 08/10/13 0423 08/11/13 0300  WBC 12.4* 14.2* 10.8*  NEUTROABS 11.2*  --   --   HGB 10.4* 9.4* 8.4*  HCT 32.3* 28.9* 26.5*  MCV 82.8 82.8 83.3  PLT 187 187 171

## 2013-08-14 NOTE — Progress Notes (Signed)
[facility] PROGRESS NOTE Interim History: 68 y.o. female hx of CAD s/p CABG, DVT not on coumadin, DM, who presented to [facility] ED with main concern of several days duration of progressively worsening shortness of breath, associated with neck pain, woke her up from sleep one night prior to this admission, requiring use of 2 pillows to improve dyspnea. This has also been associated with non productive cough, poor oral intake, malaise. Pt denies chest pain, no specific focal neurological symptoms, no fevers, chills, no recent sick contacts or exposures. She explains she has noted LE swelling worse in the LLE.   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 - Cont metoprolol. Will finish course of antibiotics on 2.7.2014. - Poor documentation of I and O's. - Appriciate advance heart failure team and nephrologyassitance. - Consulted renal.  Cardiorenal syndrome CKD IV: - Consult renal.   Type II or unspecified type diabetes mellitus without mention of complication, uncontrolled - HbgA1c 7.4, cont SSI and glipizide. - well control.  HYPERTENSION - BP stable.   Code Status: Full  Family Communication: Pt at bedside. Disposition Plan: Admit to telemetry bed.   Consultants:  Advance heart failure  Procedures: ECHO 5.4.2706: Systolic function was severely reduced. The estimated ejection fraction was in the range of 20% to 25%. Doppler parameters are consistent with a restrictive pattern, indicative of  decreased left ventricular diastolic compliance and/or increased left atrial pressure (grade 3 diastolic dysfunction). Doppler parameters are consistent with both elevated ventricular end-diastolic filling pressure and elevated left atrial filling pressure.   Antibiotics:  None  HPI/Subjective: Feels better.  Objective: Filed Vitals:   08/13/13 1517 08/13/13 2130 08/14/13 0009 08/14/13 0506  BP: 156/85 174/73 153/70 153/46  Pulse: 89 92 94 52  Temp: 97.5 F (36.4 C) 98.2 F (36.8 C)  97.7 F (36.5 C)  TempSrc: Oral Oral  Oral  Resp: $Remo'18 19  17  'fpYaE$ Height:      Weight:    54.3 kg (119 lb 11.4 oz)  SpO2: 99% 100%  100%     Exam: General: Alert, awake, oriented x3, in no acute distress.  HEENT: No bruits, no goiter. + JVD Heart: Regular rate and rhythm, without murmurs, rubs, gallops.  Lungs: Good air movement, clear to auscultation. Abdomen: Soft, nontender, nondistended, positive bowel sounds.    Data Reviewed: Basic Metabolic Panel:  Recent Labs Lab 08/26/2013 1605 08/26/2013 2210 08/10/13 0423 08/11/13 0300 08/12/13 1200 08/13/13 0258 08/13/13 1958  NA 146  --  140 140 142 140 139  K 4.6  --  4.7 4.8 4.9 4.3 4.6  CL 110  --  105 104 104 100 99  CO2 18*  --  16* $Re'19 21 22 22  'NXr$ GLUCOSE 186*  --  187* 172* 231* 236* 152*  BUN 53*  --  53* 63* 71* 73* 74*  CREATININE 2.90*  --  2.95* 3.86* 4.34* 4.57* 4.39*  CALCIUM  9.1  --  8.6 8.5 8.4 8.5 9.8  MG  --  2.1  --   --   --   --   --   PHOS  --  3.6  --   --   --  5.5* 5.8*   Liver Function Tests:  Recent Labs Lab 08/13/13 0258 08/13/13 1958  ALBUMIN 2.7* 3.0*   No results found for this basename: LIPASE, AMYLASE,  in the last 168 hours No results found for this basename: AMMONIA,  in the last 168 hours CBC:  Recent Labs Lab 08/20/2013 1605 08/10/13 0423 08/11/13 0300  WBC 12.4* 14.2* 10.8*  NEUTROABS 11.2*  --   --   HGB 10.4* 9.4* 8.4*  HCT 32.3* 28.9* 26.5*  MCV 82.8 82.8 83.3  PLT 187 187 171    Cardiac Enzymes: No results found for this basename: CKTOTAL, CKMB, CKMBINDEX, TROPONINI,  in the last 168 hours BNP (last 3 results)  Recent Labs  08/15/2013 1609  PROBNP 22793.0*   CBG:  Recent Labs Lab 08/13/13 0631 08/13/13 1049 08/13/13 1620 08/13/13 2128 08/14/13 0643  GLUCAP 175* 216* 227* 167* 209*    Recent Results (from the past 240 hour(s))  CULTURE, BLOOD (ROUTINE X 2)     Status: None   Collection Time    08/22/2013 10:10 PM      Result Value Range Status   Specimen Description BLOOD RIGHT ARM   Final   Special Requests BOTTLES DRAWN AEROBIC AND ANAEROBIC 5CC   Final   Culture  Setup Time     Final   Value: 08/10/2013 03:19     Performed at [facility]   Culture     Final   Value:        BLOOD CULTURE RECEIVED NO GROWTH TO DATE CULTURE WILL BE HELD FOR 5 DAYS BEFORE ISSUING A FINAL NEGATIVE REPORT     Performed at [facility]   Report Status PENDING   Incomplete  CULTURE, BLOOD (ROUTINE X 2)     Status: None   Collection Time    08/31/2013 10:15 PM      Result Value Range Status   Specimen Description BLOOD LEFT ARM   Final   Special Requests BOTTLES DRAWN AEROBIC ONLY 10CC   Final   Culture  Setup Time     Final   Value: 08/10/2013 03:19     Performed at [facility]   Culture     Final   Value:        BLOOD CULTURE RECEIVED NO GROWTH TO DATE CULTURE WILL BE HELD FOR 5 DAYS BEFORE ISSUING A FINAL NEGATIVE REPORT     Performed at [facility]   Report Status PENDING   Incomplete     Studies: No results found.  Scheduled Meds: . antiseptic oral rinse  15 mL Mouth Rinse BID  . aspirin  81 mg Oral Daily  . calcitRIOL  0.25 mcg Oral 3 times weekly  . calcium acetate  1,334 mg Oral TID WC  . citalopram  10 mg Oral Daily  . furosemide  160 mg Intravenous Q8H  . glimepiride  0.5 mg Oral Q breakfast  . heparin  5,000 Units Subcutaneous Q8H  . insulin aspart  0-9 Units Subcutaneous TID WC  . isosorbide-hydrALAZINE  1  tablet Oral BID  . levofloxacin  500 mg Oral Q48H  . loratadine  10 mg Oral Daily  . metolazone  5 mg Oral Daily  .  simvastatin  10 mg Oral q1800   Continuous Infusions:     Charlynne Cousins  [facility] Pager [phone]. If 8PM-8AM, please contact night-coverage at [email or web address], password [facility] 08/14/2013, 9:22 AM  LOS: 5 days

## 2013-08-15 LAB — RENAL FUNCTION PANEL
Albumin: 3 g/dL — ABNORMAL LOW (ref 3.5–5.2)
Albumin: 3.1 g/dL — ABNORMAL LOW (ref 3.5–5.2)
BUN: 78 mg/dL — AB (ref 6–23)
BUN: 83 mg/dL — ABNORMAL HIGH (ref 6–23)
CHLORIDE: 92 meq/L — AB (ref 96–112)
CO2: 25 meq/L (ref 19–32)
CO2: 28 meq/L (ref 19–32)
CREATININE: 4.75 mg/dL — AB (ref 0.50–1.10)
Calcium: 10 mg/dL (ref 8.4–10.5)
Calcium: 10.6 mg/dL — ABNORMAL HIGH (ref 8.4–10.5)
Chloride: 87 mEq/L — ABNORMAL LOW (ref 96–112)
Creatinine, Ser: 4.72 mg/dL — ABNORMAL HIGH (ref 0.50–1.10)
GFR calc Af Amer: 10 mL/min — ABNORMAL LOW (ref >90)
GFR calc Af Amer: 10 mL/min — ABNORMAL LOW (ref >90)
GFR calc non Af Amer: 9 mL/min — ABNORMAL LOW (ref >90)
GFR, EST NON AFRICAN AMERICAN: 9 mL/min — AB (ref >90)
GLUCOSE: 188 mg/dL — AB (ref 70–99)
GLUCOSE: 133 mg/dL — AB (ref 70–99)
Phosphorus: 5.7 mg/dL — ABNORMAL HIGH (ref 2.3–4.6)
Phosphorus: 6.1 mg/dL — ABNORMAL HIGH (ref 2.3–4.6)
Potassium: 4.2 mEq/L (ref 3.7–5.3)
Potassium: 4.1 mEq/L (ref 3.7–5.3)
SODIUM: 137 meq/L (ref 137–147)
Sodium: 132 mEq/L — ABNORMAL LOW (ref 137–147)

## 2013-08-15 LAB — CBC
HEMATOCRIT: 28.9 % — AB (ref 36.0–46.0)
Hemoglobin: 9.5 g/dL — ABNORMAL LOW (ref 12.0–15.0)
MCH: 27 pg (ref 26.0–34.0)
MCHC: 32.9 g/dL (ref 30.0–36.0)
MCV: 82.1 fL (ref 78.0–100.0)
Platelets: 240 10*3/uL (ref 150–400)
RBC: 3.52 MIL/uL — AB (ref 3.87–5.11)
RDW: 14.7 % (ref 11.5–15.5)
WBC: 10.4 10*3/uL (ref 4.0–10.5)

## 2013-08-15 LAB — GLUCOSE, CAPILLARY
GLUCOSE-CAPILLARY: 159 mg/dL — AB (ref 70–99)
GLUCOSE-CAPILLARY: 148 mg/dL — AB (ref 70–99)
Glucose-Capillary: 128 mg/dL — ABNORMAL HIGH (ref 70–99)
Glucose-Capillary: 199 mg/dL — ABNORMAL HIGH (ref 70–99)

## 2013-08-15 MED ORDER — FUROSEMIDE 80 MG PO TABS
160.0000 mg | ORAL_TABLET | Freq: Two times a day (BID) | ORAL | Status: DC
  Filled 2013-08-15 (×2): qty 2

## 2013-08-15 MED ORDER — FUROSEMIDE 80 MG PO TABS
160.0000 mg | ORAL_TABLET | Freq: Two times a day (BID) | ORAL | Status: DC
  Administered 2013-08-16: 160 mg via ORAL
  Filled 2013-08-15 (×3): qty 2

## 2013-08-15 NOTE — Progress Notes (Signed)
Admit: 08/26/2013 LOS: 6  53F AoCKD in setting of biventricular S/D HF, volume overload with SOB, orthopnea and cough.    Subjective:  Excellent UOP on TID lasix + Metolazone SCr stable No SOB, orthopean Good appetitie Slept in bed w/ HOB elevated Weight trending down SCr stable/slightly improved  02/07 0701 - 02/08 0700 In: 906 [P.O.:840; IV Piggyback:66] Out: 0109 [Urine:3025]  Filed Weights   08/13/13 0649 08/14/13 0506 08/15/13 0609  Weight: 57.335 kg (126 lb 6.4 oz) 54.3 kg (119 lb 11.4 oz) 51.574 kg (113 lb 11.2 oz)    Current meds: reviewed  Current Labs: reviewed    Physical Exam:  Blood pressure 132/45, pulse 88, temperature 98.1 F (36.7 C), temperature source Oral, resp. rate 18, height 5' 3.75" (1.619 m), weight 51.574 kg (113 lb 11.2 oz), SpO2 94.00%. NAD, sitting in chair, eating lunch RRR, no s3 or s4 Faint bibasilar crackles, L>R, nl wob No LEE S/nt/nd nabs No asterixus, aaox3 No rashes/lesions  Assessment 1. AoCKD (BL SCr 2s; but was 3.1 07/2012 at [place]) 2/2 cardiorenal syndrome 2. Anemia. S/p feraheme 1020 08/13/13 3. Biventricular S/D CHF, acute on chronic; LVEF 20-25% + DD 4. 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 HTN, stable 7. CAD s/p CABG 8. DM  Plan 1. Switch to PO 160 BID Lasix + Metolazone.  Next doses tomorrow AM (hold evening dose) 2. If GFR remains stable, pt euvolemic on this regimen, can safely dc w/ close f/u from renal standpoint 3. BID Renal panel 4. Hb improved 5. Hopefully can avoid RRT in near future; will need access planning.  Was interested in PD as outpt.  Ideally we can manage this as outpt and avoid TDC or HD 6. Started PhosLo 2qAC, on calcitriol 0.25 TIW, watch Ca 7. Might need ESA post IV Fe, outpt eval  Pearson Grippe MD 08/15/2013, 11:26 AM   Recent Labs Lab 08/13/13 1958 08/14/13 1540 08/15/13 0411  NA 139 137 137  K 4.6 4.4 4.2  CL 99  92* 92*  CO2 22 27 25   GLUCOSE 152* 193* 188*  BUN 74* 74* 78*  CREATININE 4.39* 4.49* 4.72*  CALCIUM 9.8 9.9 10.0  PHOS 5.8* 5.3* 5.7*    Recent Labs Lab 08/26/2013 1605 08/10/13 0423 08/11/13 0300 08/15/13 0411  WBC 12.4* 14.2* 10.8* 10.4  NEUTROABS 11.2*  --   --   --   HGB 10.4* 9.4* 8.4* 9.5*  HCT 32.3* 28.9* 26.5* 28.9*  MCV 82.8 82.8 83.3 82.1  PLT 187 187 171 240

## 2013-08-15 NOTE — Progress Notes (Signed)
[facility] PROGRESS NOTE Interim History: 68 y.o. female hx of CAD s/p CABG, DVT not on coumadin, DM, who presented to [facility] ED with main concern of several days duration of progressively worsening shortness of breath, associated with neck pain, woke her up from sleep one night prior to this admission, requiring use of 2 pillows to improve dyspnea. This has also been associated with non productive cough, poor oral intake, malaise. Pt denies chest pain, no specific focal neurological symptoms, no fevers, chills, no recent sick contacts or exposures. She explains she has noted LE swelling worse in the LLE.   Filed Weights   08/13/13 0649 08/14/13 0506 08/15/13 0609  Weight: 57.335 kg (126 lb 6.4 oz) 54.3 kg (119 lb 11.4 oz) 51.574 kg (113 lb 11.2 oz)        Intake/Output Summary (Last 24 hours) at 08/15/13 1211 Last data filed at 08/15/13 0900  Gross per 24 hour  Intake    906 ml  Output   3625 ml  Net  -2719 ml    Assessment/Plan: Acute respiratory failure due to  Acute combined systolic and diastolic congestive heart failure and right sided/PNA: - Cont IV lasix and metolazone, Cr has stall.daily weight, strict I's and O's, daily B-met. - Cont metoprolol. Will finish course of antibiotics on 2.7.2014. - Poor documentation of I and O's. 56.2 ->51.5 kg - Appriciate advance heart failure team and nephrology assitance. - Consulted renal.  Cardiorenal syndrome CKD IV: - Consult renal.   Type II or unspecified type diabetes mellitus without mention of complication, uncontrolled - HbgA1c 7.4, cont SSI and glipizide. - well control.  HYPERTENSION - BP stable. - no further changes.   Code Status: Full  Family Communication: Pt at bedside. Disposition Plan: Admit to telemetry bed.   Consultants:  Advance heart failure  Procedures: ECHO 8.5.6314: Systolic function was severely reduced. The estimated ejection fraction was in the range of 20% to 25%. Doppler parameters are  consistent with a restrictive pattern, indicative of decreased left ventricular diastolic compliance and/or increased left atrial pressure (grade 3 diastolic dysfunction). Doppler parameters are consistent with both elevated ventricular end-diastolic filling pressure and elevated left atrial filling pressure.   Antibiotics:  None  HPI/Subjective: No complains  Objective: Filed Vitals:   08/14/13 0942 08/14/13 1300 08/14/13 2021 08/15/13 0609  BP: 152/55 152/80 144/78 132/45  Pulse: 96 77 73 88  Temp: 98 F (36.7 C) 98.2 F (36.8 C) 98.8 F (37.1 C) 98.1 F (36.7 C)  TempSrc: Oral Oral Oral Oral  Resp: $Remo'20 20 18 18  'kuLpc$ Height:      Weight:    51.574 kg (113 lb 11.2 oz)  SpO2: 100% 99% 100% 94%     Exam: General: Alert, awake, oriented x3, in no acute distress.  HEENT: No bruits, no goiter. + JVD Heart: Regular rate and rhythm, without murmurs, rubs, gallops.  Lungs: Good air movement, clear to auscultation. Abdomen: Soft, nontender, nondistended, positive bowel sounds.    Data Reviewed: Basic Metabolic Panel:  Recent Labs Lab 08/12/2013 2210  08/12/13 1200 08/13/13 0258 08/13/13 1958 08/14/13 1540 08/15/13 0411  NA  --   < > 142 140 139 137 137  K  --   < > 4.9 4.3 4.6 4.4 4.2  CL  --   < > 104 100 99 92* 92*  CO2  --   < > $R'21 22 22 27 25  'JL$ GLUCOSE  --   < > 231* 236* 152* 193* 188*  BUN  --   < > 71* 73* 74* 74* 78*  CREATININE  --   < > 4.34* 4.57* 4.39* 4.49* 4.72*  CALCIUM  --   < > 8.4 8.5 9.8 9.9 10.0  MG 2.1  --   --   --   --   --   --   PHOS 3.6  --   --  5.5* 5.8* 5.3* 5.7*  < > = values in this interval not displayed. Liver Function Tests:  Recent Labs Lab 08/13/13 0258 08/13/13 1958 08/14/13 1540 08/15/13 0411  ALBUMIN 2.7* 3.0* 3.1* 3.0*   No results found for this basename: LIPASE, AMYLASE,  in the last 168 hours No results found for this basename: AMMONIA,  in the last 168 hours CBC:  Recent Labs Lab 08/29/2013 1605 08/10/13 0423  08/11/13 0300 08/15/13 0411  WBC 12.4* 14.2* 10.8* 10.4  NEUTROABS 11.2*  --   --   --   HGB 10.4* 9.4* 8.4* 9.5*  HCT 32.3* 28.9* 26.5* 28.9*  MCV 82.8 82.8 83.3 82.1  PLT 187 187 171 240   Cardiac Enzymes: No results found for this basename: CKTOTAL, CKMB, CKMBINDEX, TROPONINI,  in the last 168 hours BNP (last 3 results)  Recent Labs  08/24/2013 1609  PROBNP 22793.0*   CBG:  Recent Labs Lab 08/14/13 1147 08/14/13 1638 08/14/13 2129 08/15/13 0620 08/15/13 1143  GLUCAP 222* 170* 97 128* 159*    Recent Results (from the past 240 hour(s))  CULTURE, BLOOD (ROUTINE X 2)     Status: None   Collection Time    08/17/2013 10:10 PM      Result Value Range Status   Specimen Description BLOOD RIGHT ARM   Final   Special Requests BOTTLES DRAWN AEROBIC AND ANAEROBIC 5CC   Final   Culture  Setup Time     Final   Value: 08/10/2013 03:19     Performed at [facility]   Culture     Final   Value:        BLOOD CULTURE RECEIVED NO GROWTH TO DATE CULTURE WILL BE HELD FOR 5 DAYS BEFORE ISSUING A FINAL NEGATIVE REPORT     Performed at [facility]   Report Status PENDING   Incomplete  CULTURE, BLOOD (ROUTINE X 2)     Status: None   Collection Time    09/04/2013 10:15 PM      Result Value Range Status   Specimen Description BLOOD LEFT ARM   Final   Special Requests BOTTLES DRAWN AEROBIC ONLY 10CC   Final   Culture  Setup Time     Final   Value: 08/10/2013 03:19     Performed at [facility]   Culture     Final   Value:        BLOOD CULTURE RECEIVED NO GROWTH TO DATE CULTURE WILL BE HELD FOR 5 DAYS BEFORE ISSUING A FINAL NEGATIVE REPORT     Performed at [facility]   Report Status PENDING   Incomplete  URINE CULTURE     Status: None   Collection Time    08/13/13 12:26 AM      Result Value Range Status   Specimen Description URINE, RANDOM   Final   Special Requests NONE   Final   Culture  Setup Time     Final   Value: 08/13/2013 08:48     Performed  at [place]  Final   Value: NO GROWTH     Performed at [facility]   Culture     Final   Value: NO GROWTH     Performed at [facility]   Report Status 08/14/2013 FINAL   Final     Studies: No results found.  Scheduled Meds: . antiseptic oral rinse  15 mL Mouth Rinse BID  . aspirin  81 mg Oral Daily  . calcitRIOL  0.25 mcg Oral 3 times weekly  . calcium acetate  1,334 mg Oral TID WC  . citalopram  10 mg Oral Daily  . furosemide  160 mg Oral BID  . glimepiride  0.5 mg Oral Q breakfast  . heparin  5,000 Units Subcutaneous Q8H  . insulin aspart  0-9 Units Subcutaneous TID WC  . isosorbide-hydrALAZINE  1 tablet Oral BID  . loratadine  10 mg Oral Daily  . metolazone  5 mg Oral Daily  . simvastatin  10 mg Oral q1800   Continuous Infusions:     Charlynne Cousins  [facility] Pager [phone]. If 8PM-8AM, please contact night-coverage at [email or web address], password [facility] 08/15/2013, 12:11 PM  LOS: 6 days

## 2013-08-16 LAB — GLUCOSE, CAPILLARY
GLUCOSE-CAPILLARY: 259 mg/dL — AB (ref 70–99)
GLUCOSE-CAPILLARY: 97 mg/dL (ref 70–99)
GLUCOSE-CAPILLARY: 122 mg/dL — AB (ref 70–99)
Glucose-Capillary: 140 mg/dL — ABNORMAL HIGH (ref 70–99)

## 2013-08-16 LAB — RENAL FUNCTION PANEL
ALBUMIN: 3.1 g/dL — AB (ref 3.5–5.2)
Albumin: 3.1 g/dL — ABNORMAL LOW (ref 3.5–5.2)
BUN: 87 mg/dL — ABNORMAL HIGH (ref 6–23)
BUN: 92 mg/dL — AB (ref 6–23)
CHLORIDE: 93 meq/L — AB (ref 96–112)
CHLORIDE: 89 meq/L — AB (ref 96–112)
CO2: 23 mEq/L (ref 19–32)
CO2: 29 meq/L (ref 19–32)
CREATININE: 5.57 mg/dL — AB (ref 0.50–1.10)
Calcium: 10 mg/dL (ref 8.4–10.5)
Calcium: 10.3 mg/dL (ref 8.4–10.5)
Creatinine, Ser: 5.11 mg/dL — ABNORMAL HIGH (ref 0.50–1.10)
GFR calc Af Amer: 8 mL/min — ABNORMAL LOW (ref >90)
GFR calc non Af Amer: 7 mL/min — ABNORMAL LOW (ref >90)
GFR, EST AFRICAN AMERICAN: 9 mL/min — AB (ref >90)
GFR, EST NON AFRICAN AMERICAN: 8 mL/min — AB (ref >90)
Glucose, Bld: 141 mg/dL — ABNORMAL HIGH (ref 70–99)
Glucose, Bld: 88 mg/dL (ref 70–99)
PHOSPHORUS: 6.1 mg/dL — AB (ref 2.3–4.6)
POTASSIUM: 4.3 meq/L (ref 3.7–5.3)
Phosphorus: 6.6 mg/dL — ABNORMAL HIGH (ref 2.3–4.6)
Potassium: 5 mEq/L (ref 3.7–5.3)
SODIUM: 136 meq/L — AB (ref 137–147)
Sodium: 138 mEq/L (ref 137–147)

## 2013-08-16 LAB — CULTURE, BLOOD (ROUTINE X 2)
Culture: NO GROWTH
Culture: NO GROWTH

## 2013-08-16 MED ORDER — ISOSORB DINITRATE-HYDRALAZINE 20-37.5 MG PO TABS
1.0000 | ORAL_TABLET | Freq: Three times a day (TID) | ORAL | Status: DC
  Administered 2013-08-16 – 2013-08-20 (×9): 1 via ORAL
  Filled 2013-08-16 (×14): qty 1

## 2013-08-16 MED ORDER — CARVEDILOL 3.125 MG PO TABS
3.1250 mg | ORAL_TABLET | Freq: Two times a day (BID) | ORAL | Status: DC
  Administered 2013-08-16 – 2013-08-24 (×15): 3.125 mg via ORAL
  Filled 2013-08-16 (×18): qty 1

## 2013-08-16 MED ORDER — FUROSEMIDE 80 MG PO TABS
80.0000 mg | ORAL_TABLET | Freq: Two times a day (BID) | ORAL | Status: DC
  Administered 2013-08-16 – 2013-08-18 (×4): 80 mg via ORAL
  Filled 2013-08-16 (×6): qty 1

## 2013-08-16 NOTE — Progress Notes (Signed)
BP 91/5. Isosorbide held per MD order. RN will continue to monitor. Shellee Milo, RN

## 2013-08-16 NOTE — Progress Notes (Signed)
Advanced Heart Failure Rounding Note   Subjective:    Veronica Copeland is a 68 y.o. female hx of CAD s/p CABG (2010), CKD IV (baseline Cr 2.8/GFR 15-20), DVT not on coumadin, DM, HTN and newly diagnosed systolic HF. EF 2011 60-65%   She has been doing fairly well and reports that she was able to get around well with a walker and perfrom all her ADLs until last Thursday after she had her pneummonia vaccine. She reports that following vaccine that over the next couple of days she noticed that she had SOB and orthopnea. Denies any CP (no CP with prior MI). Lives alone. She reports she follows closely with Dr. Jenny Reichmann and takes her medications as prescribed. Reports that she follows with renal closely and they discussed starting HD or renal transplant however it was placed on hold because she had mass found in R breast and they were concerned cancer, however CT showed incidental mass.   Pertinent labs on admission were pro-BNP 22,000, Cr 2.9, HIV negative, TSH 1.11 SBPs 120-150   Echo 08/10/13:  LVEF 20-25% with regional wall motion abnormalities. Moderate RV dysfunction. +restrictive physiology   Started on milrinone with no improvement. Milrinone stopped.   Renal function continues to deteriorate. Cr 2.9->4.6> 5.1 . Renal managing diuretics. Overall weight down 16 pounds.   Wants to go home. Denies SOB.    Objective:   Weight Range:  Vital Signs:   Temp:  [98.1 F (36.7 C)-98.5 F (36.9 C)] 98.3 F (36.8 C) (02/09 0642) Pulse Rate:  [62-94] 90 (02/09 0642) Resp:  [20] 20 (02/09 0642) BP: (136-150)/(45-57) 136/57 mmHg (02/09 0642) SpO2:  [96 %-100 %] 96 % (02/09 0642) Weight:  [112 lb 11.2 oz (51.12 kg)] 112 lb 11.2 oz (51.12 kg) (02/09 0642) Last BM Date: 08/15/13  Weight change: Filed Weights   08/14/13 0506 08/15/13 0609 08/16/13 0642  Weight: 119 lb 11.4 oz (54.3 kg) 113 lb 11.2 oz (51.574 kg) 112 lb 11.2 oz (51.12 kg)    Intake/Output:   Intake/Output Summary (Last 24 hours) at  08/16/13 0925 Last data filed at 08/16/13 1443  Gross per 24 hour  Intake   1200 ml  Output   2000 ml  Net   -800 ml     Physical Exam: General: Elderly appearing. No resp difficulty; sitting in recliner  HEENT: normal  Neck: supple. JVP 6-7 with prominent CV waves . Carotids 2+ bilat; no bruits. No lymphadenopathy or thryomegaly appreciated.  Cor: PMI nondisplaced. Regular rate & rhythm. No rubs, gallops or murmurs.  Lungs: clear  Abdomen: soft, nontender,. No hepatosplenomegaly. No bruits or masses. Good bowel sounds.  Extremities: no cyanosis, clubbing, rash, no edema. Neuro: alert & orientedx3, cranial nerves grossly intact. moves all 4 extremities w/o difficulty. Affect pleasant  Telemetry: SR 90s    Labs: Basic Metabolic Panel:  Recent Labs Lab 08/08/2013 2210  08/13/13 1958 08/14/13 1540 08/15/13 0411 08/15/13 1539 08/16/13 0557  NA  --   < > 139 137 137 132* 136*  K  --   < > 4.6 4.4 4.2 4.1 5.0  CL  --   < > 99 92* 92* 87* 93*  CO2  --   < > 22 27 25 28 23   GLUCOSE  --   < > 152* 193* 188* 133* 141*  BUN  --   < > 74* 74* 78* 83* 87*  CREATININE  --   < > 4.39* 4.49* 4.72* 4.75* 5.11*  CALCIUM  --   < >  9.8 9.9 10.0 10.6* 10.0  MG 2.1  --   --   --   --   --   --   PHOS 3.6  < > 5.8* 5.3* 5.7* 6.1* 6.1*  < > = values in this interval not displayed.  Liver Function Tests:  Recent Labs Lab 08/13/13 1958 08/14/13 1540 08/15/13 0411 08/15/13 1539 08/16/13 0557  ALBUMIN 3.0* 3.1* 3.0* 3.1* 3.1*   No results found for this basename: LIPASE, AMYLASE,  in the last 168 hours No results found for this basename: AMMONIA,  in the last 168 hours  CBC:  Recent Labs Lab 08/21/2013 1605 08/10/13 0423 08/11/13 0300 08/15/13 0411  WBC 12.4* 14.2* 10.8* 10.4  NEUTROABS 11.2*  --   --   --   HGB 10.4* 9.4* 8.4* 9.5*  HCT 32.3* 28.9* 26.5* 28.9*  MCV 82.8 82.8 83.3 82.1  PLT 187 187 171 240    Cardiac Enzymes: No results found for this basename: CKTOTAL,  CKMB, CKMBINDEX, TROPONINI,  in the last 168 hours  BNP: BNP (last 3 results)  Recent Labs  08/12/2013 1609  PROBNP 22793.0*     Other results:  Imaging: No results found.   Medications:     Scheduled Medications: . antiseptic oral rinse  15 mL Mouth Rinse BID  . aspirin  81 mg Oral Daily  . calcitRIOL  0.25 mcg Oral 3 times weekly  . calcium acetate  1,334 mg Oral TID WC  . citalopram  10 mg Oral Daily  . furosemide  160 mg Oral BID  . glimepiride  0.5 mg Oral Q breakfast  . heparin  5,000 Units Subcutaneous Q8H  . insulin aspart  0-9 Units Subcutaneous TID WC  . isosorbide-hydrALAZINE  1 tablet Oral BID  . loratadine  10 mg Oral Daily  . metolazone  5 mg Oral Daily  . simvastatin  10 mg Oral q1800    Infusions:    PRN Medications: fluticasone, hydrALAZINE, HYDROcodone-acetaminophen, nitroGLYCERIN, ondansetron (ZOFRAN) IV, ondansetron, polyethylene glycol, traMADol   Assessment:   1) Acute combined systolic/diastolic HF  - EF 73-41%, grade III DD, RV mildly dilated and sys fx mod reduced  2) Cardiorenal syndrome  3) DM  4) HTN  5) Acute/Chronic kidney failure, stage IV 2.8->3.9> 5.1  6) CAD s/p CABG 2010  Plan/Discussion:   Renal function continues decline but she has had good UOP on current diuretics. Nephrology following and adjusting diuretics. Volume status appears stable.  Possible PD as outpatient.   Hold off on ischemic work up for now.   Will sign off for now.      Length of Stay: 7 CLEGG,AMY NP-C  08/16/2013, 9:25 AM  Advanced Heart Failure Team Pager [phone] (M-F; 7a - 4p)  Please contact [facility] for night-coverage after hours (4p -7a ) and weekends on [email or web address]  Patient seen with NP, agree with the above note.  Patient is stable today.  Creatinine worsening slowly but UOP still quite good on high dose diuretics.   - Will make Bidil tid and will add Coreg 3.125 mg bid.   - Continue ASA and statin.  - She will need ischemic  workup with LHC at some point given h/o CABG and worsening EF.  However, will need to wait until she has been definitively committed to dialysis. Not urgent, no chest pain etc.   Loralie Champagne 08/16/2013 10:55 AM

## 2013-08-16 NOTE — Progress Notes (Addendum)
[facility] PROGRESS NOTE Interim History: 68 y.o. female hx of CAD s/p CABG, DVT not on coumadin, DM, who presented to [facility] ED with main concern of several days duration of progressively worsening shortness of breath, associated with neck pain, woke her up from sleep one night prior to this admission, requiring use of 2 pillows to improve dyspnea. This has also been associated with non productive cough, poor oral intake, malaise. Pt denies chest pain, no specific focal neurological symptoms, no fevers, chills, no recent sick contacts or exposures. She explains she has noted LE swelling worse in the LLE.   Filed Weights   08/14/13 0506 08/15/13 0609 08/16/13 0642  Weight: 54.3 kg (119 lb 11.4 oz) 51.574 kg (113 lb 11.2 oz) 51.12 kg (112 lb 11.2 oz)        Intake/Output Summary (Last 24 hours) at 08/16/13 1036 Last data filed at 08/16/13 1035  Gross per 24 hour  Intake   1200 ml  Output   2200 ml  Net  -1000 ml    Assessment/Plan: Acute respiratory failure due to  Acute combined systolic and diastolic congestive heart failure and right sided/PNA: - Cont IV lasix and metolazone, Worsening Cr. - Cont metoprolol. Will finish course of antibiotics on 2.7.2014. - Poor documentation of I and O's. 56.2 ->51.5 kg weight has halted. - Appriciate advance heart failure team and nephrology assitance. - Consulted renal.  Cardiorenal syndrome CKD IV: - Consult renal. - worsening Cr, with lasix and metolazone. - will probably need PD.  Type II or unspecified type diabetes mellitus without mention of complication, uncontrolled - HbgA1c 7.4, cont SSI and glipizide. - well control.  HYPERTENSION - BP stable. - no further changes.   Code Status: Full  Family Communication: Pt at bedside. Disposition Plan: Admit to telemetry bed.   Consultants:  Advance heart failure  renal  Procedures: ECHO 123XX123: Systolic function was severely reduced. The estimated ejection fraction was in  the range of 20% to 25%. Doppler parameters are consistent with a restrictive pattern, indicative of decreased left ventricular diastolic compliance and/or increased left atrial pressure (grade 3 diastolic dysfunction). Doppler parameters are consistent with both elevated ventricular end-diastolic filling pressure and elevated left atrial filling pressure.   Antibiotics:  None  HPI/Subjective: No complains  Objective: Filed Vitals:   08/15/13 1430 08/15/13 2108 08/16/13 0642 08/16/13 1004  BP: 141/46 150/45 136/57 136/56  Pulse: 94 62 90   Temp: 98.1 F (36.7 C) 98.5 F (36.9 C) 98.3 F (36.8 C)   TempSrc: Oral Oral Oral   Resp: 20 20 20 18   Height:      Weight:   51.12 kg (112 lb 11.2 oz)   SpO2: 100% 98% 96% 97%     Exam: General: Alert, awake, oriented x3, in no acute distress.  HEENT: No bruits, no goiter. + JVD Heart: Regular rate and rhythm, without murmurs, rubs, gallops.  Lungs: Good air movement, clear to auscultation. Abdomen: Soft, nontender, nondistended, positive bowel sounds.    Data Reviewed: Basic Metabolic Panel:  Recent Labs Lab 08/23/2013 2210  08/13/13 1958 08/14/13 1540 08/15/13 0411 08/15/13 1539 08/16/13 0557  NA  --   < > 139 137 137 132* 136*  K  --   < > 4.6 4.4 4.2 4.1 5.0  CL  --   < > 99 92* 92* 87* 93*  CO2  --   < > 22 27 25 28 23   GLUCOSE  --   < > 152* 193*  188* 133* 141*  BUN  --   < > 74* 74* 78* 83* 87*  CREATININE  --   < > 4.39* 4.49* 4.72* 4.75* 5.11*  CALCIUM  --   < > 9.8 9.9 10.0 10.6* 10.0  MG 2.1  --   --   --   --   --   --   PHOS 3.6  < > 5.8* 5.3* 5.7* 6.1* 6.1*  < > = values in this interval not displayed. Liver Function Tests:  Recent Labs Lab 08/13/13 1958 08/14/13 1540 08/15/13 0411 08/15/13 1539 08/16/13 0557  ALBUMIN 3.0* 3.1* 3.0* 3.1* 3.1*   No results found for this basename: LIPASE, AMYLASE,  in the last 168 hours No results found for this basename: AMMONIA,  in the last 168  hours CBC:  Recent Labs Lab 08/29/2013 1605 08/10/13 0423 08/11/13 0300 08/15/13 0411  WBC 12.4* 14.2* 10.8* 10.4  NEUTROABS 11.2*  --   --   --   HGB 10.4* 9.4* 8.4* 9.5*  HCT 32.3* 28.9* 26.5* 28.9*  MCV 82.8 82.8 83.3 82.1  PLT 187 187 171 240   Cardiac Enzymes: No results found for this basename: CKTOTAL, CKMB, CKMBINDEX, TROPONINI,  in the last 168 hours BNP (last 3 results)  Recent Labs  08/08/2013 1609  PROBNP 22793.0*   CBG:  Recent Labs Lab 08/15/13 0620 08/15/13 1143 08/15/13 1616 08/15/13 2127 08/16/13 0617  GLUCAP 128* 159* 148* 199* 140*    Recent Results (from the past 240 hour(s))  CULTURE, BLOOD (ROUTINE X 2)     Status: None   Collection Time    08/12/2013 10:10 PM      Result Value Range Status   Specimen Description BLOOD RIGHT ARM   Final   Special Requests BOTTLES DRAWN AEROBIC AND ANAEROBIC 5CC   Final   Culture  Setup Time     Final   Value: 08/10/2013 03:19     Performed at [facility]   Culture     Final   Value: NO GROWTH 5 DAYS     Performed at [facility]   Report Status 08/16/2013 FINAL   Final  CULTURE, BLOOD (ROUTINE X 2)     Status: None   Collection Time    08/30/2013 10:15 PM      Result Value Range Status   Specimen Description BLOOD LEFT ARM   Final   Special Requests BOTTLES DRAWN AEROBIC ONLY 10CC   Final   Culture  Setup Time     Final   Value: 08/10/2013 03:19     Performed at [facility]   Culture     Final   Value: NO GROWTH 5 DAYS     Performed at [facility]   Report Status 08/16/2013 FINAL   Final  URINE CULTURE     Status: None   Collection Time    08/13/13 12:26 AM      Result Value Range Status   Specimen Description URINE, RANDOM   Final   Special Requests NONE   Final   Culture  Setup Time     Final   Value: 08/13/2013 08:48     Performed at [place]     Final   Value: NO GROWTH     Performed at [facility]   Culture     Final    Value: NO GROWTH     Performed at [facility]  Report Status 08/14/2013 FINAL   Final     Studies: No results found.  Scheduled Meds: . antiseptic oral rinse  15 mL Mouth Rinse BID  . aspirin  81 mg Oral Daily  . calcitRIOL  0.25 mcg Oral 3 times weekly  . calcium acetate  1,334 mg Oral TID WC  . citalopram  10 mg Oral Daily  . furosemide  160 mg Oral BID  . glimepiride  0.5 mg Oral Q breakfast  . heparin  5,000 Units Subcutaneous Q8H  . insulin aspart  0-9 Units Subcutaneous TID WC  . isosorbide-hydrALAZINE  1 tablet Oral BID  . loratadine  10 mg Oral Daily  . metolazone  5 mg Oral Daily  . simvastatin  10 mg Oral q1800   Continuous Infusions:     Charlynne Cousins  [facility] Pager [phone]. If 8PM-8AM, please contact night-coverage at [email or web address], password [facility] 08/16/2013, 10:36 AM  LOS: 7 days

## 2013-08-16 NOTE — Progress Notes (Signed)
Occupational Therapy Treatment Patient Details Name: Veronica Copeland MRN: [id number] DOB: 1945/08/31 Today's Date: 08/16/2013 Time: 0160-1093 OT Time Calculation (min): 17 min  OT Assessment / Plan / Recommendation  History of present illness 68 yo female admitted with CHF, probable Pna. Hx of CAD, CABG, MI, DM DVT. Pt lives alone.    OT comments  Pt making progress with functional goals ans should continue with acute OT services to increase level of functional and safety. Pt now has L LE AFO. Depending on continued progress, may not need SNF, may just need HH  Follow Up Recommendations  Home health OT;SNF;Supervision/Assistance - 24 hour    Barriers to Discharge   Pt lives at home alone    Equipment Recommendations  None recommended by OT    Recommendations for Other Services    Frequency Min 2X/week   Progress towards OT Goals Progress towards OT goals: Progressing toward goals  Plan Discharge plan remains appropriate    Precautions / Restrictions Precautions Precautions: Fall Precaution Comments: L drop foot. wears AFO Restrictions Weight Bearing Restrictions: No   Pertinent Vitals/Pain No c/o pain    ADL  Grooming: Performed;Wash/dry hands;Wash/dry face;Supervision/safety;Set up;Min guard Where Assessed - Grooming: Supported standing Lower Body Bathing: Simulated;Minimal assistance Where Assessed - Lower Body Bathing: Unsupported standing Lower Body Dressing: Performed;Minimal assistance Where Assessed - Lower Body Dressing: Supported sit to stand Toilet Transfer: Performed;Supervision/safety;Modified independent Armed forces technical officer Method: Sit to Loss adjuster, chartered: Regular height toilet;Grab bars Toileting - Water quality scientist and Hygiene: Performed;Minimal assistance Where Assessed - Best boy and Hygiene: Standing Tub/Shower Transfer: Simulated;Minimal assistance (simulated for tub shower) Tub/Shower Transfer Method:  Therapist, art: Grab bars Transfers/Ambulation Related to ADLs: increased time, pt unsteady    OT Diagnosis:    OT Problem List:   OT Treatment Interventions:     OT Goals(current goals can now be found in the care plan section) Acute Rehab OT Goals Patient Stated Goal: go home  Visit Information  Last OT Received On: 08/16/13 History of Present Illness: 68 yo female admitted with CHF, probable Pna. Hx of CAD, CABG, MI, DM DVT. Pt lives alone.     Subjective Data      Prior Functioning       Cognition  Cognition Arousal/Alertness: Awake/alert Behavior During Therapy: WFL for tasks assessed/performed Overall Cognitive Status: Within Functional Limits for tasks assessed    Mobility  Bed Mobility General bed mobility comments: pt up in recliner Transfers Overall transfer level: Modified independent Equipment used: Rolling walker (2 wheeled) Transfers: Sit to/from Stand Sit to Stand: Modified independent (Device/Increase time);Supervision          Balance Balance Sitting-balance support: No upper extremity supported;Feet supported Sitting balance-Leahy Scale: Good Standing balance support: Single extremity supported;No upper extremity supported;During functional activity Standing balance-Leahy Scale: Fair  End of Session OT - End of Session Equipment Utilized During Treatment: Gait belt;Rolling walker Activity Tolerance: Patient tolerated treatment well Patient left: in chair;with call bell/phone within reach  GO     Britt Bottom 08/16/2013, 2:39 PM

## 2013-08-16 NOTE — Progress Notes (Signed)
Patient alert and oriented x4 throughout shift.  Patient states she has intermittent pain in her abdomen "from shots" (SQ heparin).  Ice pack did not relieve pain, so patient was administered PRN for pain.  Patient states PRN relieves pain completely.  Patient denies any questions or concerns at this time.  Will continue to monitor.

## 2013-08-16 NOTE — Progress Notes (Signed)
Admit: 2013-09-04 LOS: 7  55F AoCKD in setting of biventricular S/D HF, volume overload with SOB, orthopnea and cough.    Subjective:  Good UOP on TID lasix + Metolazone- now with absolutely no edema and no O2 req SCr climbing slowly daily No SOB, orthopean Good appetite- not uremic in the least Slept in bed w/ HOB elevated Weight trending down   02/08 0701 - 02/09 0700 In: 960 [P.O.:960] Out: 2400 [Urine:2400]  Filed Weights   08/14/13 0506 08/15/13 0609 08/16/13 0642  Weight: 54.3 kg (119 lb 11.4 oz) 51.574 kg (113 lb 11.2 oz) 51.12 kg (112 lb 11.2 oz)    Current meds: reviewed  Current Labs: reviewed    Physical Exam:  Blood pressure 136/56, pulse 90, temperature 98.3 F (36.8 C), temperature source Oral, resp. rate 18, height 5' 3.75" (1.619 m), weight 51.12 kg (112 lb 11.2 oz), SpO2 97.00%. NAD, sitting in chair, eating lunch RRR, no s3 or s4 Faint bibasilar crackles, L>R, nl wob No LEE S/nt/nd nabs No asterixus, aaox3 No rashes/lesions  Assessment 1. AoCKD (BL SCr 2s; but was 3.1 07/2012 at [facility]) 2/2 cardiorenal syndrome 2. Anemia. S/p feraheme 1020 08/13/13 3. Biventricular S/D CHF, acute on chronic; LVEF 20-25% + DD 4. 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 HTN, stable 7. CAD s/p CABG 8. DM  Plan 1. Creatinine climbing daily but does not appear uremic.  I am going to stop metolazone and decrease lasix some in an effort to get creatinine to stabilize or improve.   2. If GFR becomes stable, pt euvolemic on this regimen, can safely dc w/ close f/u from renal standpoint 3.  4. Hb improved 5. Hopefully can avoid RRT in near future; if needed it, it would need to be HD.  Was interested in PD as outpt.  Ideally we can manage this as outpt and avoid TDC or HD 6. Started PhosLo 2qAC, on calcitriol 0.25 TIW, watch Ca 7. Might need ESA post IV Fe, outpt eval  Elmond Poehlman A  08/16/2013,  12:44 PM   Recent Labs Lab 08/15/13 0411 08/15/13 1539 08/16/13 0557  NA 137 132* 136*  K 4.2 4.1 5.0  CL 92* 87* 93*  CO2 25 28 23   GLUCOSE 188* 133* 141*  BUN 78* 83* 87*  CREATININE 4.72* 4.75* 5.11*  CALCIUM 10.0 10.6* 10.0  PHOS 5.7* 6.1* 6.1*    Recent Labs Lab 2013/09/04 1605 08/10/13 0423 08/11/13 0300 08/15/13 0411  WBC 12.4* 14.2* 10.8* 10.4  NEUTROABS 11.2*  --   --   --   HGB 10.4* 9.4* 8.4* 9.5*  HCT 32.3* 28.9* 26.5* 28.9*  MCV 82.8 82.8 83.3 82.1  PLT 187 187 171 240

## 2013-08-17 ENCOUNTER — Other Ambulatory Visit (HOSPITAL_COMMUNITY): Payer: Self-pay | Admitting: *Deleted

## 2013-08-17 DIAGNOSIS — N179 Acute kidney failure, unspecified: Secondary | ICD-10-CM

## 2013-08-17 DIAGNOSIS — I5041 Acute combined systolic (congestive) and diastolic (congestive) heart failure: Secondary | ICD-10-CM

## 2013-08-17 DIAGNOSIS — I509 Heart failure, unspecified: Secondary | ICD-10-CM

## 2013-08-17 DIAGNOSIS — N19 Unspecified kidney failure: Secondary | ICD-10-CM

## 2013-08-17 DIAGNOSIS — I1310 Hypertensive heart and chronic kidney disease without heart failure, with stage 1 through stage 4 chronic kidney disease, or unspecified chronic kidney disease: Secondary | ICD-10-CM

## 2013-08-17 LAB — GLUCOSE, CAPILLARY
GLUCOSE-CAPILLARY: 282 mg/dL — AB (ref 70–99)
GLUCOSE-CAPILLARY: 200 mg/dL — AB (ref 70–99)
Glucose-Capillary: 129 mg/dL — ABNORMAL HIGH (ref 70–99)
Glucose-Capillary: 194 mg/dL — ABNORMAL HIGH (ref 70–99)

## 2013-08-17 LAB — RENAL FUNCTION PANEL
ALBUMIN: 3 g/dL — AB (ref 3.5–5.2)
Albumin: 3.1 g/dL — ABNORMAL LOW (ref 3.5–5.2)
BUN: 93 mg/dL — ABNORMAL HIGH (ref 6–23)
BUN: 98 mg/dL — AB (ref 6–23)
CHLORIDE: 89 meq/L — AB (ref 96–112)
CO2: 25 mEq/L (ref 19–32)
CO2: 28 mEq/L (ref 19–32)
Calcium: 9.4 mg/dL (ref 8.4–10.5)
Calcium: 9.6 mg/dL (ref 8.4–10.5)
Chloride: 89 mEq/L — ABNORMAL LOW (ref 96–112)
Creatinine, Ser: 5.46 mg/dL — ABNORMAL HIGH (ref 0.50–1.10)
Creatinine, Ser: 6.5 mg/dL — ABNORMAL HIGH (ref 0.50–1.10)
GFR calc Af Amer: 8 mL/min — ABNORMAL LOW (ref >90)
GFR calc Af Amer: 7 mL/min — ABNORMAL LOW (ref >90)
GFR calc non Af Amer: 6 mL/min — ABNORMAL LOW (ref >90)
GFR, EST NON AFRICAN AMERICAN: 7 mL/min — AB (ref >90)
GLUCOSE: 174 mg/dL — AB (ref 70–99)
Glucose, Bld: 164 mg/dL — ABNORMAL HIGH (ref 70–99)
PHOSPHORUS: 7.1 mg/dL — AB (ref 2.3–4.6)
POTASSIUM: 4.2 meq/L (ref 3.7–5.3)
Phosphorus: 7 mg/dL — ABNORMAL HIGH (ref 2.3–4.6)
Potassium: 4.7 mEq/L (ref 3.7–5.3)
Sodium: 134 mEq/L — ABNORMAL LOW (ref 137–147)
Sodium: 136 mEq/L — ABNORMAL LOW (ref 137–147)

## 2013-08-17 NOTE — Progress Notes (Signed)
Admit: 08/21/2013 LOS: 8  89F AoCKD in setting of biventricular S/D HF, volume overload with SOB, orthopnea and cough.    Subjective:  Resonable UOP on oral lasix SCr stable not uremic in the least   02/09 0701 - 02/10 0700 In: 1320 [P.O.:1320] Out: 950 [Urine:950]  Filed Weights   08/15/13 0609 08/16/13 0642 08/17/13 0634  Weight: 51.574 kg (113 lb 11.2 oz) 51.12 kg (112 lb 11.2 oz) 51.2 kg (112 lb 14 oz)    Current meds: reviewed  Current Labs: reviewed    Physical Exam:  Blood pressure 116/70, pulse 85, temperature 97.8 F (36.6 C), temperature source Oral, resp. rate 19, height 5' 3.75" (1.619 m), weight 51.2 kg (112 lb 14 oz), SpO2 97.00%. NAD, sitting in chair, eating lunch RRR, no s3 or s4 Faint bibasilar crackles, L>R, nl wob No LEE S/nt/nd nabs No asterixus, aaox3 No rashes/lesions  Assessment 1. AoCKD (BL SCr 2s; but was 3.1 07/2012 at [facility]) 2/2 cardiorenal syndrome 2. Anemia. S/p feraheme 1020 08/13/13 3. Biventricular S/D CHF, acute on chronic; LVEF 20-25% + DD 4. 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 HTN, stable 7. CAD s/p CABG 8. DM  Plan 1. Creatinine up but does not appear uremic. Creatinine stable on decreased dose of lasix and she is not uremic in the least.     2. If GFR becomes stable, pt euvolemic on this regimen, can safely dc w/ close f/u from renal standpoint- have discussed with Dr. Venetia Constable- if creatinine stable tomorrow I am OK with discharge 3.  4. Hb improved 5. Hopefully can avoid RRT in near future; if needed it, it would need to be HD.  Was interested in PD as outpt.  Ideally we can manage this as outpt and avoid TDC or HD 6. Started PhosLo 2qAC, on calcitriol 0.25 TIW, watch Ca 7. Might need ESA post IV Fe, outpt eval  Lindsay Straka A  08/17/2013, 11:48 AM   Recent Labs Lab 08/16/13 0557 08/16/13 1555 08/17/13 0310  NA 136* 138 134*  K 5.0 4.3 4.2   CL 93* 89* 89*  CO2 23 29 25   GLUCOSE 141* 88 164*  BUN 87* 92* 93*  CREATININE 5.11* 5.57* 5.46*  CALCIUM 10.0 10.3 9.4  PHOS 6.1* 6.6* 7.0*    Recent Labs Lab 08/11/13 0300 08/15/13 0411  WBC 10.8* 10.4  HGB 8.4* 9.5*  HCT 26.5* 28.9*  MCV 83.3 82.1  PLT 171 240

## 2013-08-17 NOTE — Progress Notes (Signed)
CSW met with patient today. At first she stated that she planned to return home at d/c for a little while and then go to SNF but after talking to her for a while she agreed to go to short term SNF first.  She prefers [facility] if possible. SW will follow up with [facility] tomorrow to determine if they have any openings. CSW will monitor for possible date of d/c.  Lorie Phenix. [place], [place]

## 2013-08-17 NOTE — Progress Notes (Signed)
Patient alert and oriented x4 throughout shift.  Patient states she continues to have sharp pain in her abdomen (she thinks from heparin shots) with movement.  MD aware.  Patient received PRN pain medicine twice throughout shift, which completely resolves pain.  Patient noted itching on arms that was not resolved with body lotion.  MD notified; no additional orders at this time.  Patient anticipating discharge tomorrow.  Patient states she has no questions or concerns at this time.  Will continue to monitor.

## 2013-08-17 NOTE — Progress Notes (Signed)
Physical Therapy Treatment Patient Details Name: Veronica Copeland MRN: [id number] DOB: [birth year] Today's Date: 08/17/2013 Time: 4818-5631 PT Time Calculation (min): 26 min  PT Assessment / Plan / Recommendation  History of Present Illness 68 yo female admitted with CHF, probable Pna. Hx of CAD, CABG, MI, DM DVT. Pt lives alone.    PT Comments   Pt ambulating at S level with L AFO for 95 feet.  Pt wants to go home, but has 14 steps.  Pt declined to attempt stairs due to fatigue.  Pt needed A with donning L AFO today as well.  Follow Up Recommendations  SNF;Other (comment) (if pt refuses, then recommend HHPT)     Does the patient have the potential to tolerate intense rehabilitation     Barriers to Discharge        Equipment Recommendations  None recommended by PT    Recommendations for Other Services    Frequency     Progress towards PT Goals Progress towards PT goals: Progressing toward goals  Plan Current plan remains appropriate    Precautions / Restrictions Precautions Precautions: Fall Precaution Comments: L drop foot. wears AFO Restrictions Weight Bearing Restrictions: No   Pertinent Vitals/Pain Reports neck pain is better.    Mobility  Bed Mobility General bed mobility comments: pt up in recliner Transfers Overall transfer level: Modified independent Sit to Stand: Modified independent (Device/Increase time) (poorly controlled descent) General transfer comment:  (from recliner and from [facility]) Ambulation/Gait Ambulation/Gait assistance: Supervision Ambulation Distance (Feet): 95 Feet Assistive device: Rolling walker (2 wheeled) Gait Pattern/deviations: Decreased step length - right Gait velocity: decreased General Gait Details:  (Pt took 3 standing rest breaks.) Stairs:  (Pt declined to attempt stairs.)    Exercises General Exercises - Lower Extremity Long Arc Quad: Strengthening;10 reps;Seated   PT Diagnosis:    PT Problem List:   PT Treatment Interventions:      PT Goals (current goals can now be found in the care plan section) Acute Rehab PT Goals Patient Stated Goal: go home PT Goal Formulation: With patient Time For Goal Achievement: 08/22/2013 Potential to Achieve Goals: Good  Visit Information  Last PT Received On: 08/17/13 Assistance Needed: +1 History of Present Illness: 68 yo female admitted with CHF, probable Pna. Hx of CAD, CABG, MI, DM DVT. Pt lives alone.     Subjective Data  Patient Stated Goal: go home   Cognition  Cognition Arousal/Alertness: Awake/alert Behavior During Therapy: WFL for tasks assessed/performed Overall Cognitive Status: Within Functional Limits for tasks assessed    Balance  Balance Standing balance-Leahy Scale: Fair General Comments General comments (skin integrity, edema, etc.): Pt needed A to don L LE AFO.   End of Session PT - End of Session Equipment Utilized During Treatment: Gait belt;Other (comment) (L AFO) Activity Tolerance: Patient tolerated treatment well Patient left: in chair;with call bell/phone within reach   GP     [place] 08/17/2013, 10:53 AM

## 2013-08-17 NOTE — Progress Notes (Addendum)
[facility] PROGRESS NOTE Interim History: 68 y.o. female hx of CAD s/p CABG, DVT not on coumadin, DM, who presented to [facility] ED with main concern of several days duration of progressively worsening shortness of breath, associated with neck pain, woke her up from sleep one night prior to this admission, requiring use of 2 pillows to improve dyspnea. This has also been associated with non productive cough, poor oral intake, malaise. Pt denies chest pain, no specific focal neurological symptoms, no fevers, chills, no recent sick contacts or exposures. She explains she has noted LE swelling worse in the LLE.   Filed Weights   08/15/13 0609 08/16/13 0642 08/17/13 0634  Weight: 51.574 kg (113 lb 11.2 oz) 51.12 kg (112 lb 11.2 oz) 51.2 kg (112 lb 14 oz)        Intake/Output Summary (Last 24 hours) at 08/17/13 1208 Last data filed at 08/17/13 0900  Gross per 24 hour  Intake   1320 ml  Output    775 ml  Net    545 ml    Assessment/Plan: Acute respiratory failure due to  Acute combined systolic and diastolic congestive heart failure and right sided/PNA: - Started on aggressive diuretic with lasix an metolazone, but poor diuresis. - consulted Heart failure team and diuretics increase. -  Due to rising cr consulted renal which cont diuretics. - On oral lasix off metolazone cr has stabilize. - Cont metoprolol. Finish course of antibiotics on 2.7.2014. - Appriciate advance heart failure team and nephrology assitance. - if Cr stable can be D/c in am. - will need ischemic work up with cardiology as an outpatient once dialysis started.  Cardiorenal syndrome CKD IV: - Consult renal. - Cr has stabilize, off metolazone, cont lasix at current dose for home. - will probably need PD. - If cr stable can be d/c in am.  Type II or unspecified type diabetes mellitus without mention of complication, uncontrolled - HbgA1c 7.4, cont SSI and glipizide. - well control.  HYPERTENSION - BP stable. - no  further changes.   Code Status: Full  Family Communication: Pt at bedside. Disposition Plan: Admit to telemetry bed.   Consultants:  Advance heart failure  renal  Procedures: ECHO [id number]: Systolic function was severely reduced. The estimated ejection fraction was in the range of 20% to 25%. Doppler parameters are consistent with a restrictive pattern, indicative of decreased left ventricular diastolic compliance and/or increased left atrial pressure (grade 3 diastolic dysfunction). Doppler parameters are consistent with both elevated ventricular end-diastolic filling pressure and elevated left atrial filling pressure.   Antibiotics:  None  HPI/Subjective: No complains  Objective: Filed Vitals:   08/16/13 2042 08/17/13 0611 08/17/13 0634 08/17/13 1101  BP: 91/51 123/67  116/70  Pulse: 61 87  85  Temp: 98.7 F (37.1 C) 97.8 F (36.6 C)    TempSrc: Oral Oral    Resp: 18 19    Height:      Weight:   51.2 kg (112 lb 14 oz)   SpO2: 97% 95%  97%     Exam: General: Alert, awake, oriented x3, in no acute distress.  HEENT: No bruits, no goiter. - JVD Heart: Regular rate and rhythm, without murmurs, rubs, gallops.  Lungs: Good air movement, clear to auscultation. Abdomen: Soft, nontender, nondistended, positive bowel sounds.    Data Reviewed: Basic Metabolic Panel:  Recent Labs Lab 08/15/13 0411 08/15/13 1539 08/16/13 0557 08/16/13 1555 08/17/13 0310  NA 137 132* 136* 138 134*  K 4.2 4.1 5.0  4.3 4.2  CL 92* 87* 93* 89* 89*  CO2 25 28 23 29 25   GLUCOSE 188* 133* 141* 88 164*  BUN 78* 83* 87* 92* 93*  CREATININE 4.72* 4.75* 5.11* 5.57* 5.46*  CALCIUM 10.0 10.6* 10.0 10.3 9.4  PHOS 5.7* 6.1* 6.1* 6.6* 7.0*   Liver Function Tests:  Recent Labs Lab 08/15/13 0411 08/15/13 1539 08/16/13 0557 08/16/13 1555 08/17/13 0310  ALBUMIN 3.0* 3.1* 3.1* 3.1* 3.0*   No results found for this basename: LIPASE, AMYLASE,  in the last 168 hours No results found for  this basename: AMMONIA,  in the last 168 hours CBC:  Recent Labs Lab 08/11/13 0300 08/15/13 0411  WBC 10.8* 10.4  HGB 8.4* 9.5*  HCT 26.5* 28.9*  MCV 83.3 82.1  PLT 171 240   Cardiac Enzymes: No results found for this basename: CKTOTAL, CKMB, CKMBINDEX, TROPONINI,  in the last 168 hours BNP (last 3 results)  Recent Labs  08-23-13 1609  PROBNP 22793.0*   CBG:  Recent Labs Lab 08/16/13 1102 08/16/13 1609 08/16/13 2121 08/17/13 0553 08/17/13 1048  GLUCAP 259* 97 122* 129* 282*    Recent Results (from the past 240 hour(s))  CULTURE, BLOOD (ROUTINE X 2)     Status: None   Collection Time    Aug 23, 2013 10:10 PM      Result Value Range Status   Specimen Description BLOOD RIGHT ARM   Final   Special Requests BOTTLES DRAWN AEROBIC AND ANAEROBIC 5CC   Final   Culture  Setup Time     Final   Value: 08/10/2013 03:19     Performed at [facility]   Culture     Final   Value: NO GROWTH 5 DAYS     Performed at [facility]   Report Status 08/16/2013 FINAL   Final  CULTURE, BLOOD (ROUTINE X 2)     Status: None   Collection Time    08/23/2013 10:15 PM      Result Value Range Status   Specimen Description BLOOD LEFT ARM   Final   Special Requests BOTTLES DRAWN AEROBIC ONLY 10CC   Final   Culture  Setup Time     Final   Value: 08/10/2013 03:19     Performed at [facility]   Culture     Final   Value: NO GROWTH 5 DAYS     Performed at [facility]   Report Status 08/16/2013 FINAL   Final  URINE CULTURE     Status: None   Collection Time    08/13/13 12:26 AM      Result Value Range Status   Specimen Description URINE, RANDOM   Final   Special Requests NONE   Final   Culture  Setup Time     Final   Value: 08/13/2013 08:48     Performed at [facility]     Final   Value: NO GROWTH     Performed at [facility]   Culture     Final   Value: NO GROWTH     Performed at [facility]   Report Status  08/14/2013 FINAL   Final     Studies: No results found.  Scheduled Meds: . antiseptic oral rinse  15 mL Mouth Rinse BID  . aspirin  81 mg Oral Daily  . calcitRIOL  0.25 mcg Oral 3 times weekly  . calcium acetate  1,334 mg Oral TID WC  .  carvedilol  3.125 mg Oral BID WC  . citalopram  10 mg Oral Daily  . furosemide  80 mg Oral BID  . glimepiride  0.5 mg Oral Q breakfast  . heparin  5,000 Units Subcutaneous Q8H  . insulin aspart  0-9 Units Subcutaneous TID WC  . isosorbide-hydrALAZINE  1 tablet Oral TID  . loratadine  10 mg Oral Daily  . simvastatin  10 mg Oral q1800   Continuous Infusions:     Charlynne Cousins  [facility] Pager [phone]. If 8PM-8AM, please contact night-coverage at [email or web address], password [facility] 08/17/2013, 12:08 PM  LOS: 8 days

## 2013-08-18 ENCOUNTER — Encounter (HOSPITAL_COMMUNITY): Payer: Self-pay | Admitting: General Surgery

## 2013-08-18 ENCOUNTER — Other Ambulatory Visit (HOSPITAL_COMMUNITY): Payer: Self-pay

## 2013-08-18 DIAGNOSIS — N186 End stage renal disease: Secondary | ICD-10-CM

## 2013-08-18 DIAGNOSIS — R5381 Other malaise: Secondary | ICD-10-CM

## 2013-08-18 DIAGNOSIS — Z01810 Encounter for preprocedural cardiovascular examination: Secondary | ICD-10-CM

## 2013-08-18 DIAGNOSIS — N184 Chronic kidney disease, stage 4 (severe): Secondary | ICD-10-CM

## 2013-08-18 LAB — GLUCOSE, CAPILLARY
GLUCOSE-CAPILLARY: 169 mg/dL — AB (ref 70–99)
Glucose-Capillary: 168 mg/dL — ABNORMAL HIGH (ref 70–99)
Glucose-Capillary: 227 mg/dL — ABNORMAL HIGH (ref 70–99)
Glucose-Capillary: 137 mg/dL — ABNORMAL HIGH (ref 70–99)

## 2013-08-18 LAB — RENAL FUNCTION PANEL
ALBUMIN: 3 g/dL — AB (ref 3.5–5.2)
BUN: 110 mg/dL — ABNORMAL HIGH (ref 6–23)
CO2: 26 mEq/L (ref 19–32)
CREATININE: 6.53 mg/dL — AB (ref 0.50–1.10)
Calcium: 9.2 mg/dL (ref 8.4–10.5)
Chloride: 89 mEq/L — ABNORMAL LOW (ref 96–112)
GFR calc Af Amer: 7 mL/min — ABNORMAL LOW (ref >90)
GFR, EST NON AFRICAN AMERICAN: 6 mL/min — AB (ref >90)
Glucose, Bld: 188 mg/dL — ABNORMAL HIGH (ref 70–99)
Phosphorus: 7 mg/dL — ABNORMAL HIGH (ref 2.3–4.6)
Potassium: 4.3 mEq/L (ref 3.7–5.3)
Sodium: 134 mEq/L — ABNORMAL LOW (ref 137–147)

## 2013-08-18 MED ORDER — SODIUM CHLORIDE 0.9 % IV SOLN
125.0000 mg | Freq: Every day | INTRAVENOUS | Status: AC
  Administered 2013-08-18: 125 mg via INTRAVENOUS
  Filled 2013-08-18 (×2): qty 10

## 2013-08-18 MED ORDER — DARBEPOETIN ALFA-POLYSORBATE 60 MCG/0.3ML IJ SOLN
60.0000 ug | INTRAMUSCULAR | Status: DC
  Administered 2013-08-18: 60 ug via SUBCUTANEOUS
  Filled 2013-08-18: qty 0.3

## 2013-08-18 NOTE — Consult Note (Signed)
Veronica Copeland October 23, 1945  [id number].   Requesting MD: Dr. Corliss Parish Chief Complaint/Reason for Consult: needs PD cath HPI: This is a 68 yo female with multiple medical problems who was admitted recently due to SOB.  She was found to have new onset biventricular heart failure.  He EF has decreased to 20-25% and she has some regional wall abnormalities with restrictive physiology noted.  During this admission her CKD worsened.  Her Creatinine is now up to 6; however, she is still making urine and is not uremic.  The patient can not get further ischemic workup due to contrast dye load causing likely permanent renal failure and definite dialysis.  Due to her increasing creatinine, prior to dc home the nephrologist have asked [place] to consider placement of PD cath incase she may eventually need this.  ROS: Please see HPI, otherwise all other systems are currently negative.  SOB controlled with medications and diuresis  Family History  Problem Relation Age of Onset  . Diabetes    . Hypertension    . Stroke    . Coronary artery disease      Past Medical History  Diagnosis Date  . CAD (coronary artery disease)     s/p CABG  . HLD (hyperlipidemia)   . HTN (hypertension)   . Acute MI   . Lung nodule   . Anemia   . Allergic rhinitis   . Osteopenia   . Vaginitis   . Ganglion of joint   . Depression   . DVT (deep venous thrombosis)   . Paraplegia 2010    "used walker since they took vein out of LLE for CABG" (08/24/2013)  . Anemia   . GERD (gastroesophageal reflux disease)   . Peripheral vascular disease 08/14/12    cancellrd scheduled appointment with Dr. Quay Burow  . Pneumonia     "once" (08/29/2013)  . Shortness of breath     "all the time today" (08/15/2013)  . Type II diabetes mellitus   . Stroke 2010  . Chronic kidney disease     "think dialysis is in my future; don't know exactly what the problem is" (08/16/2013)    Past Surgical History  Procedure Laterality Date  .  Shoulder arthroscopy w/ rotator cuff repair Right   . Cesarean section    . Coronary artery bypass graft  03/2009  . Laminectomy  2010  . Excisional hemorrhoidectomy  1980's  . Vaginal hysterectomy    . Dilation and curettage of uterus    . Back surgery    . Cataract extraction, bilateral Bilateral     Social History:  reports that she has never smoked. She has never used smokeless tobacco. She reports that she does not drink alcohol or use illicit drugs.  Allergies:  Allergies  Allergen Reactions  . Penicillins Other (See Comments)    Yeast     Medications Prior to Admission  Medication Sig Dispense Refill  . aspirin 81 MG tablet Take 81 mg by mouth daily.        . calcitRIOL (ROCALTROL) 0.25 MCG capsule Take 0.25 mcg by mouth 3 (three) times a week. Take 1 by mouth on Monday, Wednesday and friday      . cetirizine (ZYRTEC) 10 MG tablet Take 10 mg by mouth at bedtime.       . fluticasone (FLONASE) 50 MCG/ACT nasal spray Place 2 sprays into both nostrils daily as needed for allergies or rhinitis.      Marland Kitchen glimepiride (AMARYL) 1  MG tablet Take 0.5 mg by mouth daily with breakfast.      . HYDROcodone-acetaminophen (NORCO/VICODIN) 5-325 MG per tablet Take 1 tablet by mouth every 8 (eight) hours as needed for pain.  15 tablet  0  . insulin aspart (NOVOLOG) 100 UNIT/ML injection Inject 8-10 Units into the skin 2 (two) times daily.      Marland Kitchen losartan (COZAAR) 50 MG tablet Take 50 mg by mouth daily.      . Multiple Vitamins-Minerals (CENTRUM SILVER ADULT 50+ PO) Take 1 tablet by mouth daily.      . nitroGLYCERIN (NITROSTAT) 0.4 MG SL tablet Place 0.4 mg under the tongue every 5 (five) minutes as needed for chest pain.       . pravastatin (PRAVACHOL) 10 MG tablet Take 10 mg by mouth daily.      . SYRINGE-NEEDLE, DISP, 3 ML (TERUMO SURGUARD2 SYRINGE) 25G X 1" 3 ML MISC Inject 1 application into the skin as directed.  100 each  1  . traMADol (ULTRAM) 50 MG tablet Take 50 mg by mouth every 6 (six)  hours as needed for moderate pain.      . citalopram (CELEXA) 10 MG tablet Take 10 mg by mouth daily.        Blood pressure 129/45, pulse 92, temperature 97.9 F (36.6 C), temperature source Oral, resp. rate 18, height 5' 3.75" (1.619 m), weight 113 lb 4.8 oz (51.393 kg), SpO2 98.00%. Physical Exam: General: pleasant, WD, WN black female who is sitting up in her chair in NAD HEENT: head is normocephalic, atraumatic.  Sclera are noninjected.  PERRL.  Ears and nose without any masses or lesions.  Mouth is pink and moist Heart: regular, rate, and rhythm.  Normal s1,s2. No obvious murmurs, gallops, or rubs noted.  Palpable radial and pedal pulses bilaterally Lungs: CTAB, no wheezes, rhonchi, or rales noted.  Respiratory effort nonlabored Abd: soft, NT, ND, +BS, no masses, hernias, or organomegaly Skin: warm and dry with no masses, lesions, or rashes Psych: A&Ox3 with an appropriate affect.    Results for orders placed during the hospital encounter of 08/26/2013 (from the past 48 hour(s))  RENAL FUNCTION PANEL     Status: Abnormal   Collection Time    08/16/13  3:55 PM      Result Value Ref Range   Sodium 138  137 - 147 mEq/L   Potassium 4.3  3.7 - 5.3 mEq/L   Chloride 89 (*) 96 - 112 mEq/L   CO2 29  19 - 32 mEq/L   Glucose, Bld 88  70 - 99 mg/dL   BUN 92 (*) 6 - 23 mg/dL   Creatinine, Ser 5.57 (*) 0.50 - 1.10 mg/dL   Calcium 10.3  8.4 - 10.5 mg/dL   Phosphorus 6.6 (*) 2.3 - 4.6 mg/dL   Albumin 3.1 (*) 3.5 - 5.2 g/dL   GFR calc non Af Amer 7 (*) >90 mL/min   GFR calc Af Amer 8 (*) >90 mL/min   Comment: (NOTE)     The eGFR has been calculated using the CKD EPI equation.     This calculation has not been validated in all clinical situations.     eGFR's persistently <90 mL/min signify possible Chronic Kidney     Disease.  GLUCOSE, CAPILLARY     Status: None   Collection Time    08/16/13  4:09 PM      Result Value Ref Range   Glucose-Capillary 97  70 - 99 mg/dL  GLUCOSE,  CAPILLARY      Status: Abnormal   Collection Time    08/16/13  9:21 PM      Result Value Ref Range   Glucose-Capillary 122 (*) 70 - 99 mg/dL   Comment 1 Documented in Chart     Comment 2 Notify RN    RENAL FUNCTION PANEL     Status: Abnormal   Collection Time    08/17/13  3:10 AM      Result Value Ref Range   Sodium 134 (*) 137 - 147 mEq/L   Potassium 4.2  3.7 - 5.3 mEq/L   Chloride 89 (*) 96 - 112 mEq/L   CO2 25  19 - 32 mEq/L   Glucose, Bld 164 (*) 70 - 99 mg/dL   BUN 93 (*) 6 - 23 mg/dL   Creatinine, Ser 5.46 (*) 0.50 - 1.10 mg/dL   Calcium 9.4  8.4 - 10.5 mg/dL   Phosphorus 7.0 (*) 2.3 - 4.6 mg/dL   Albumin 3.0 (*) 3.5 - 5.2 g/dL   GFR calc non Af Amer 7 (*) >90 mL/min   GFR calc Af Amer 8 (*) >90 mL/min   Comment: (NOTE)     The eGFR has been calculated using the CKD EPI equation.     This calculation has not been validated in all clinical situations.     eGFR's persistently <90 mL/min signify possible Chronic Kidney     Disease.  GLUCOSE, CAPILLARY     Status: Abnormal   Collection Time    08/17/13  5:53 AM      Result Value Ref Range   Glucose-Capillary 129 (*) 70 - 99 mg/dL  GLUCOSE, CAPILLARY     Status: Abnormal   Collection Time    08/17/13 10:48 AM      Result Value Ref Range   Glucose-Capillary 282 (*) 70 - 99 mg/dL   Comment 1 Notify RN    GLUCOSE, CAPILLARY     Status: Abnormal   Collection Time    08/17/13  4:10 PM      Result Value Ref Range   Glucose-Capillary 200 (*) 70 - 99 mg/dL   Comment 1 Notify RN    RENAL FUNCTION PANEL     Status: Abnormal   Collection Time    08/17/13  4:30 PM      Result Value Ref Range   Sodium 136 (*) 137 - 147 mEq/L   Potassium 4.7  3.7 - 5.3 mEq/L   Chloride 89 (*) 96 - 112 mEq/L   CO2 28  19 - 32 mEq/L   Glucose, Bld 174 (*) 70 - 99 mg/dL   BUN 98 (*) 6 - 23 mg/dL   Creatinine, Ser 6.50 (*) 0.50 - 1.10 mg/dL   Calcium 9.6  8.4 - 10.5 mg/dL   Phosphorus 7.1 (*) 2.3 - 4.6 mg/dL   Albumin 3.1 (*) 3.5 - 5.2 g/dL   GFR calc non  Af Amer 6 (*) >90 mL/min   GFR calc Af Amer 7 (*) >90 mL/min   Comment: (NOTE)     The eGFR has been calculated using the CKD EPI equation.     This calculation has not been validated in all clinical situations.     eGFR's persistently <90 mL/min signify possible Chronic Kidney     Disease.  GLUCOSE, CAPILLARY     Status: Abnormal   Collection Time    08/17/13  9:22 PM      Result Value Ref Range   Glucose-Capillary  194 (*) 70 - 99 mg/dL   Comment 1 Notify RN     Comment 2 Documented in Chart    RENAL FUNCTION PANEL     Status: Abnormal   Collection Time    08/18/13  3:47 AM      Result Value Ref Range   Sodium 134 (*) 137 - 147 mEq/L   Potassium 4.3  3.7 - 5.3 mEq/L   Chloride 89 (*) 96 - 112 mEq/L   CO2 26  19 - 32 mEq/L   Glucose, Bld 188 (*) 70 - 99 mg/dL   BUN 110 (*) 6 - 23 mg/dL   Creatinine, Ser 6.53 (*) 0.50 - 1.10 mg/dL   Calcium 9.2  8.4 - 10.5 mg/dL   Phosphorus 7.0 (*) 2.3 - 4.6 mg/dL   Albumin 3.0 (*) 3.5 - 5.2 g/dL   GFR calc non Af Amer 6 (*) >90 mL/min   GFR calc Af Amer 7 (*) >90 mL/min   Comment: (NOTE)     The eGFR has been calculated using the CKD EPI equation.     This calculation has not been validated in all clinical situations.     eGFR's persistently <90 mL/min signify possible Chronic Kidney     Disease.  GLUCOSE, CAPILLARY     Status: Abnormal   Collection Time    08/18/13  6:22 AM      Result Value Ref Range   Glucose-Capillary 168 (*) 70 - 99 mg/dL   Comment 1 Documented in Chart     Comment 2 Notify RN    GLUCOSE, CAPILLARY     Status: Abnormal   Collection Time    08/18/13 11:24 AM      Result Value Ref Range   Glucose-Capillary 227 (*) 70 - 99 mg/dL   Comment 1 Notify RN     No results found.     Assessment/Plan 1. 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 Biventricular heart failure, NYHA class 3 08/12/2013  . Chronic kidney disease (CKD), stage IV (severe) 08/12/2013  . Acute  combined systolic and diastolic congestive heart failure 08/11/2013  . AKI (acute kidney injury) 08/11/2013  . Leukocytosis, unspecified 08/11/2013  . Respiratory failure, acute 08/11/2013  . Cardiorenal syndrome with renal failure 08/11/2013  . CHF (congestive heart failure) 08/11/2013  . Breast mass 04/23/2013  . Right foot pain 06/24/2012  . CKD (chronic kidney disease) 06/24/2012  . Muscle cramps 12/03/2011  . Type II or unspecified type diabetes mellitus without mention of complication, uncontrolled 12/03/2011  . Gait disorder 06/05/2011  . Preventative health care 06/03/2011  . GERD 08/15/2010  . FOOT PAIN, LEFT 08/15/2010  . B12 DEFICIENCY 02/14/2010  . 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 ANEMIA, IRON DEFICIENCY 02/01/2010  . BLOOD IN STOOL, OCCULT 02/01/2010  . Problems with smell and taste 01/31/2010  . CAD, ARTERY BYPASS GRAFT 09/18/2009  . SINUS TACHYCARDIA 09/18/2009  . PERIPHERAL EDEMA 07/13/2009  . NEOPLASM UNCERTAIN BEHAVIOR BRAIN&SPINAL CORD 05/25/2009  . Atrial fibrillation 05/15/2009  . Paraplegia 03/11/2009  . DEEP VENOUS THROMBOPHLEBITIS 03/11/2009  . LUNG NODULE 03/08/2009  . MYOCARDIAL INFARCTION, ACUTE 10/04/2008  . BACK PAIN 09/27/2008  . GOITER, MULTINODULAR 06/17/2008  . ANEMIA-NOS 11/04/2007  . ALLERGIC RHINITIS 11/04/2007  . VAGINITIS 11/04/2007  . OSTEOPENIA 11/04/2007  . HYPERLIPIDEMIA 04/12/2007  . ANXIETY 04/12/2007  . DEPRESSION 04/12/2007  . HYPERTENSION 04/12/2007  . CORONARY ARTERY DISEASE 04/12/2007  . GANGLION  CYST, WRIST, RIGHT 04/12/2007   Plan: 1. I will need to discuss this with Dr. Grandville Silos.  I have spoken to Dr. Haroldine Laws about this patient given her cardiac diagnoses.  He feels general anesthesia is not prohibited in this case, but she is at least a moderate risk for complications.  Right now, there is no definite guarantee the patient will need or have to use her PD cath.  Moderate risk is a somewhat unfavorable risk to proceed with  a procedure the patient may not eventually need.  Her risk for CVA, MI, death are higher than the average patient given her cardiac disease.  It may be reasonable to place a temporary catheter for HD now and follow her and see how she does.  If she converts to ESRD, then she can have her cardiac workup and based off of that if she is still felt to be ok for surgery, and the patient still expressed a desire for PD, then we could consider placement at that time.  I have to be concerned that her risks for this procedure may not outweigh her benefit at this time.  Further recommendations after discussion with Dr. Grandville Silos.   Melondy Blanchard E 08/18/2013, 2:10 PM Pager: [phone]

## 2013-08-18 NOTE — Consult Note (Signed)
I D/W Dr. Kellie Simmering from [facility] at the bedside. They plan HD cath insertion and possible fistula tomorrow. Agree that is the best approach. Patient examined and I agree with the assessment and plan  Georganna Skeans, MD, MPH, FACS Pager: [phone]  08/18/2013 4:41 PM

## 2013-08-18 NOTE — Progress Notes (Signed)
Admit: 09/01/2013 LOS: 9  70F AoCKD in setting of biventricular S/D HF, volume overload with SOB, orthopnea and cough.    Subjective:  good UOP on oral lasix SCr worsening as well as BUN Walking with PT-is fatigued but says that is not new Dispo is up in the air, now going to [facility]?    02/10 0701 - 02/11 0700 In: 1540 [P.O.:1540] Out: 1250 [Urine:1250]  Filed Weights   08/16/13 0642 08/17/13 0634 08/18/13 0602  Weight: 51.12 kg (112 lb 11.2 oz) 51.2 kg (112 lb 14 oz) 51.393 kg (113 lb 4.8 oz)    Current meds: reviewed  Current Labs: reviewed    Physical Exam:  Blood pressure 129/45, pulse 92, temperature 97.9 F (36.6 C), temperature source Oral, resp. rate 18, height 5' 3.75" (1.619 m), weight 51.393 kg (113 lb 4.8 oz), SpO2 98.00%. NAD, sitting in chair, eating lunch RRR, no s3 or s4 Faint bibasilar crackles, L>R, nl wob No LEE S/nt/nd nabs No asterixus, aaox3 No rashes/lesions  Assessment 1. AoCKD (BL SCr 2s; but was 3.1 07/2012 at [place]) 2/2 cardiorenal syndrome 2. Anemia. S/p feraheme 1020 08/13/13 3. Biventricular S/D CHF, acute on chronic; LVEF 20-25% + DD 4. 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 HTN, stable 7. CAD s/p CABG 8. DM  Plan 1. Creatinine up but does not appear overly uremic. Creatinine worsening on decreased dose of lasix and she is fatigued. I will stop the lasix for now.    2. Since renal function is worsening- I am not comfortable for her to be discharged just yet.  Patient ultimately wants to do PD so I think at least having PD catheter placed this admit would be a start, then she could initiate dialysis as an OP in the next 2-4  weeks.  In the event that she does not make it that long she would need PC and initiation of HD which I would like to try and not do at this time.  I have called surgery for consult to see if PD cath could be placed maybe tomorrow  or Friday?  Then if has not crossed the threshold to becoming dialysis requiring, she could go home with close renal follow up 3.  4. Hb improved- will give ferrlecit and aranesp here 5. Started PhosLo 2qAC, on calcitriol 0.25 TIW, watch Ca- phos is still up   Bearden A  08/18/2013, 11:21 AM   Recent Labs Lab 08/17/13 0310 08/17/13 1630 08/18/13 0347  NA 134* 136* 134*  K 4.2 4.7 4.3  CL 89* 89* 89*  CO2 25 28 26   GLUCOSE 164* 174* 188*  BUN 93* 98* 110*  CREATININE 5.46* 6.50* 6.53*  CALCIUM 9.4 9.6 9.2  PHOS 7.0* 7.1* 7.0*    Recent Labs Lab 08/15/13 0411  WBC 10.4  HGB 9.5*  HCT 28.9*  MCV 82.1  PLT 240

## 2013-08-18 NOTE — Progress Notes (Signed)
Physical Therapy Treatment Patient Details Name: Veronica Copeland MRN: [id number] DOB: 08/26/1945 Today's Date: 08/18/2013 Time: 1037-1100 PT Time Calculation (min): 23 min  PT Assessment / Plan / Recommendation  History of Present Illness 68 yo female admitted with CHF, probable Pna. Hx of CAD, CABG, MI, DM DVT. Pt lives alone.    PT Comments   Pt agreeable to attempt stairs today. Will need continued work with stairs as SNF due to 14 steps to enter home.  Follow Up Recommendations  SNF     Does the patient have the potential to tolerate intense rehabilitation     Barriers to Discharge        Equipment Recommendations  None recommended by PT    Recommendations for Other Services    Frequency Min 3X/week   Progress towards PT Goals Progress towards PT goals: Progressing toward goals  Plan Current plan remains appropriate    Precautions / Restrictions Precautions Precautions: Fall Precaution Comments: L drop foot. wears AFO   Pertinent Vitals/Pain 6/10 neck pain    Mobility  Transfers Overall transfer level: Modified independent Equipment used: Rolling walker (2 wheeled) Sit to Stand: Modified independent (Device/Increase time) General transfer comment:  (Pt donned L AFO/shoe and R shoe prior to standing.) Ambulation/Gait Ambulation/Gait assistance: Supervision Ambulation Distance (Feet): 130 Feet Assistive device: Rolling walker (2 wheeled) Gait Pattern/deviations: Decreased step length - right Gait velocity: decreased General Gait Details: 4 standing rest breaks. Stairs: Yes Stairs assistance: Mod assist (MOD A descnding due to having to go backwards, MIN ascending) Stair Management: One rail Right;Forwards;Backwards;Step to pattern Number of Stairs: 3 General stair comments: Pt went up 3 steps but due to narrowness of the step unable to turn around so went down backwards with MOD A.    Exercises     PT Diagnosis:    PT Problem List:   PT Treatment  Interventions:     PT Goals (current goals can now be found in the care plan section)    Visit Information  Last PT Received On: 08/18/13 Assistance Needed: +1 History of Present Illness: 68 yo female admitted with CHF, probable Pna. Hx of CAD, CABG, MI, DM DVT. Pt lives alone.     Subjective Data  Subjective: Pt may be going to [facility] for a short stay while pt begins dialysis per Nurse Tech.   Cognition  Cognition Arousal/Alertness: Awake/alert Behavior During Therapy: WFL for tasks assessed/performed Overall Cognitive Status: Within Functional Limits for tasks assessed    Balance  Balance Standing balance-Leahy Scale: Fair  End of Session PT - End of Session Equipment Utilized During Treatment: Gait belt;Other (comment) (L AFO) Activity Tolerance: Patient tolerated treatment well Patient left: in chair;with call bell/phone within reach;Other (comment) (MD in room with pt.) Nurse Communication: Patient requests pain meds;Mobility status   GP     Veronica Copeland 08/18/2013, 11:15 AM

## 2013-08-18 NOTE — Progress Notes (Signed)
Right  Upper Extremity Vein Map    Cephalic  Segment Diameter Depth Comment  1. Axilla 1.68mm    2. Mid upper arm 1.71mm    3. Above AC 0.89mm    4. In [facility]   Unable to visualize  5. Below AC   Unable to visualize  6. Mid forearm   Unable to visualize  7. Wrist   Unable to visualize                  Basilic  Segment Diameter Depth Comment  1. Axilla 5.50mm 21.22mm   2. Mid upper arm 3.61mm 24.41mm   3. Above AC 2.92mm 26.53mm   4. In [facility] 2.75mm 12.69mm   5. Below AC 2.21mm 18.83mm   6. Mid forearm   Unable to visualize  7. Wrist   Unable to visualize                   Left Upper Extremity Vein Map    Cephalic  Segment Diameter Depth Comment  1. Axilla 2.16mm    2. Mid upper arm 2.57mm    3. Above AC 2.67mm    4. In [facility] 3.20mm    5. Below AC 3.4mm    6. Mid forearm 2.55mm    7. Wrist 1.9mm                    Basilic  Segment Diameter Depth Comment  1. Axilla mm mm   2. Mid upper arm 3.46mm 24.9mm   3. Above [facility] 3.98mm 22.51mm   4. In [facility] 3.66mm 3.32mm   5. Below AC 3.68mm 2.56mm   6. Mid forearm 2.73mm 1.39mm Multiple branches  7. Wrist 1.87mm 1.42mm                    08/18/2013 7:04 PM Maudry Mayhew, RVT, RDCS, RDMS

## 2013-08-18 NOTE — Progress Notes (Signed)
SNF bed offers in place; patient has chosen [facility]- possible stability tomorrow per MD.  Bed will be available.  At [facility] per [facility]- Admissions Director. CSW will monitor.  Lorie Phenix. [place], [place]

## 2013-08-18 NOTE — Consult Note (Signed)
[facility] NOTE   MRN : [id number]  Reason for Consult: Dialysis access Referring Physician: Dr. Moshe Cipro   History of Present Illness: 68 y/o female who was admitted due to progressive shortness of breath requiring use of 2 pillows to improve dyspnea.  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  Echo shows EF of 20-30%.  hx of CAD s/p CABG, DVT not on coumadin, and DM.  We have been asked to provide permanent access and diatek.  Past medical history hypertension, hyperlipidemia, and DM.  She is on stain, insulin and Asprin daily.  She is on 5,000 SQ heparin TID.    Current Facility-Administered Medications  Medication Dose Route Frequency Provider Last Rate Last Dose  . antiseptic oral rinse (BIOTENE) solution 15 mL  15 mL Mouth Rinse BID Theodis Blaze, MD   15 mL at 08/18/13 0800  . aspirin chewable tablet 81 mg  81 mg Oral Daily Theodis Blaze, MD   81 mg at 08/18/13 1154  . calcitRIOL (ROCALTROL) capsule 0.25 mcg  0.25 mcg Oral Once per day on Mon Wed Fri Theodis Blaze, MD   0.25 mcg at 08/18/13 1458  . calcium acetate (PHOSLO) capsule 1,334 mg  1,334 mg Oral TID WC Rexene Agent, MD   1,334 mg at 08/18/13 1153  . carvedilol (COREG) tablet 3.125 mg  3.125 mg Oral BID WC Charlynne Cousins, MD   3.125 mg at 08/18/13 7371  . citalopram (CELEXA) tablet 10 mg  10 mg Oral Daily Theodis Blaze, MD   10 mg at 08/18/13 1154  . darbepoetin (ARANESP) injection 60 mcg  60 mcg Subcutaneous Q Wed-1800 Louis Meckel, MD      . ferric gluconate (NULECIT) 125 mg in sodium chloride 0.9 % 100 mL IVPB  125 mg Intravenous Daily Louis Meckel, MD   125 mg at 08/18/13 1459  . fluticasone (FLONASE) 50 MCG/ACT nasal spray 2 spray  2 spray Each Nare Daily PRN Theodis Blaze, MD      . glimepiride (AMARYL) tablet 0.5 mg  0.5 mg Oral Q breakfast Theodis Blaze, MD   0.5 mg at 08/18/13 1026  . heparin  injection 5,000 Units  5,000 Units Subcutaneous 3 times per day Theodis Blaze, MD   5,000 Units at 08/18/13 1501  . hydrALAZINE (APRESOLINE) injection 5 mg  5 mg Intravenous Q6H PRN Theodis Blaze, MD      . HYDROcodone-acetaminophen (NORCO/VICODIN) 5-325 MG per tablet 1 tablet  1 tablet Oral Q6H PRN Theodis Blaze, MD   1 tablet at 08/17/13 2201  . insulin aspart (novoLOG) injection 0-9 Units  0-9 Units Subcutaneous TID WC Theodis Blaze, MD   3 Units at 08/18/13 1200  . isosorbide-hydrALAZINE (BIDIL) 20-37.5 MG per tablet 1 tablet  1 tablet Oral TID Charlynne Cousins, MD   1 tablet at 08/18/13 1153  . loratadine (CLARITIN) tablet 10 mg  10 mg Oral Daily Theodis Blaze, MD   10 mg at 08/18/13 1154  . nitroGLYCERIN (NITROSTAT) SL tablet 0.4 mg  0.4 mg Sublingual Q5 min PRN Theodis Blaze, MD      . ondansetron [facility]) tablet 4 mg  4 mg Oral Q6H PRN Theodis Blaze, MD       Or  . ondansetron [facility]) injection 4 mg  4 mg Intravenous Q6H PRN Theodis Blaze,  MD   4 mg at 08/12/13 0036  . polyethylene glycol (MIRALAX / GLYCOLAX) packet 17 g  17 g Oral Daily PRN Charlynne Cousins, MD   17 g at 08/17/13 1726  . simvastatin (ZOCOR) tablet 10 mg  10 mg Oral q1800 Theodis Blaze, MD   10 mg at 08/17/13 1725  . traMADol (ULTRAM) tablet 50 mg  50 mg Oral Q6H PRN Theodis Blaze, MD   50 mg at 08/18/13 1154    Pt meds include: Statin :Yes Betablocker: No ASA: Yes Other anticoagulants/antiplatelets:   Past Medical History  Diagnosis Date  . CAD (coronary artery disease)     s/p CABG  . HLD (hyperlipidemia)   . HTN (hypertension)   . Acute MI   . Lung nodule   . Anemia   . Allergic rhinitis   . Osteopenia   . Vaginitis   . Ganglion of joint   . Depression   . DVT (deep venous thrombosis)   . Paraplegia 2010    "used walker since they took vein out of LLE for CABG" (08/31/2013)  . Anemia   . GERD (gastroesophageal reflux disease)   . Peripheral vascular disease 08/14/12    cancellrd scheduled  appointment with Dr. Quay Burow  . Pneumonia     "once" (08/10/2013)  . Shortness of breath     "all the time today" (08/26/2013)  . Type II diabetes mellitus   . Stroke 2010  . Chronic kidney disease     "think dialysis is in my future; don't know exactly what the problem is" (08/17/2013)    Past Surgical History  Procedure Laterality Date  . Shoulder arthroscopy w/ rotator cuff repair Right   . Cesarean section    . Coronary artery bypass graft  03/2009  . Laminectomy  2010  . Excisional hemorrhoidectomy  1980's  . Vaginal hysterectomy    . Dilation and curettage of uterus    . Back surgery    . Cataract extraction, bilateral Bilateral     Social History History  Substance Use Topics  . Smoking status: Never Smoker   . Smokeless tobacco: Never Used  . Alcohol Use: No    Family History Family History  Problem Relation Age of Onset  . Diabetes    . Hypertension    . Stroke    . Coronary artery disease      Allergies  Allergen Reactions  . Penicillins Other (See Comments)    Yeast      REVIEW OF SYSTEMS  General: [ ]  Weight loss, [ ]  Fever, [ ]  chills Neurologic: [ ]  Dizziness, [ ]  Blackouts, [ ]  Seizure [ ]  Stroke, [ ]  "Mini stroke", [ ]  Slurred speech, [ ]  Temporary blindness; [ ]  weakness in arms or legs, [ ]  Hoarseness [ ]  Dysphagia Cardiac: [ ]  Chest pain/pressure, [x ] Shortness of breath at rest [ ]  Shortness of breath with exertion, [ ]  Atrial fibrillation or irregular heartbeat  Vascular: [ ]  Pain in legs with walking, [ ]  Pain in legs at rest, [ ]  Pain in legs at night,  [ ]  Non-healing ulcer, [ ]  Blood clot in vein/DVT,   Pulmonary: [ ]  Home oxygen, [ ]  Productive cough, [ ]  Coughing up blood, [ ]  Asthma,  [ ]  Wheezing [ ]  COPD Musculoskeletal:  [ ]  Arthritis, [ ]  Low back pain, [ ]  Joint pain Hematologic: [ ]  Easy Bruising, [ ]  Anemia; [ ]  Hepatitis Gastrointestinal: [ ]  Blood  in stool, [ ]  Gastroesophageal Reflux/heartburn, Urinary: [ ]  chronic  Kidney disease, [ ]  on HD - [ ]  MWF or [ ]  TTHS, [ ]  Burning with urination, [ ]  Difficulty urinating Skin: [ ]  Rashes, [ ]  Wounds Psychological: [ ]  Anxiety, [ ]  Depression  Physical Examination Filed Vitals:   08/17/13 1500 08/17/13 2100 08/18/13 0602 08/18/13 1032  BP: 126/48 132/47 142/52 129/45  Pulse: 80 87 96 92  Temp: 97.6 F (36.4 C) 98.2 F (36.8 C) 97.9 F (36.6 C)   TempSrc: Oral Oral Oral   Resp:  17 18   Height:      Weight:   113 lb 4.8 oz (51.393 kg)   SpO2: 98% 100% 98%    Body mass index is 19.61 kg/(m^2).  General:  WDWN in NAD Gait: Normal HENT: WNL Eyes: Pupils equal Pulmonary: normal non-labored breathing , without Rales, rhonchi,  wheezing Cardiac: RRR, without  Murmurs, rubs or gallops; No carotid bruits Abdomen: soft, NT, no masses Skin: no rashes, ulcers noted;  no Gangrene , no cellulitis; no open wounds;   Vascular Exam/Pulses:palpable radial and brachial pulses bilaterally.     Musculoskeletal: no muscle wasting or atrophy; no edema  Neurologic: A&O X 3; Appropriate Affect ;  SENSATION: normal; MOTOR FUNCTION: 5/5 Symmetric, left foot drop uses brace for ambulation Speech is fluent/normal   Significant Diagnostic Studies: CBC Lab Results  Component Value Date   WBC 10.4 08/15/2013   HGB 9.5* 08/15/2013   HCT 28.9* 08/15/2013   MCV 82.1 08/15/2013   PLT 240 08/15/2013    BMET    Component Value Date/Time   NA 134* 08/18/2013 0347   K 4.3 08/18/2013 0347   CL 89* 08/18/2013 0347   CO2 26 08/18/2013 0347   GLUCOSE 188* 08/18/2013 0347   BUN 110* 08/18/2013 0347   CREATININE 6.53* 08/18/2013 0347   CALCIUM 9.2 08/18/2013 0347   GFRNONAA 6* 08/18/2013 0347   GFRAA 7* 08/18/2013 0347   Estimated Creatinine Clearance: 6.6 ml/min (by C-G formula based on Cr of 6.53).  COAG Lab Results  Component Value Date   INR 1.1 03/29/2009   INR 0.9 03/27/2009   INR 1.0 10/14/2008     Non-Invasive Vascular Imaging:  Pending vein  mapping  ASSESSMENT/PLAN:  Acute on CKD She is right hand dominant Plan for diatek placement with possible AV fistula creation tomorrow Vein mapping ordered    Laurence Slate [facility] 08/18/2013 3:45 PM  Agree with above assessment Patient appears to have satisfactory left upper arm cephalic vein on physical exam but awake and vein mapping which has been ordered Plan insertion hemodialysis catheter plus probable left arm AV fistula tomorrow per Dr. Aldean Jewett with patient and she is agreeable

## 2013-08-18 NOTE — Progress Notes (Signed)
Occupational Therapy Treatment Patient Details Name: Veronica Copeland MRN: [id number] DOB: October 04, 1945 Today's Date: 08/18/2013 Time: 8413-2440 OT Time Calculation (min): 27 min  OT Assessment / Plan / Recommendation  History of present illness 68 yo female admitted with CHF, probable Pna. Hx of CAD, CABG, MI, DM DVT. Pt lives alone.    OT comments  Pt making progress and should continue with acute OT services to increase level of function and safety. Uncertain if pt is still agreeable to short term SNF for rehab or will be going home with [facility]  Follow Up Recommendations  Home health OT;SNF;Supervision/Assistance - 24 hour    Barriers to Discharge   Pt lives at home alone    Equipment Recommendations  Tub/shower bench;3 in 1 bedside comode    Recommendations for Other Services    Frequency Min 2X/week   Progress towards OT Goals Progress towards OT goals: Progressing toward goals  Plan Discharge plan remains appropriate    Precautions / Restrictions Precautions Precautions: Fall Precaution Comments: L drop foot. wears AFO Restrictions Weight Bearing Restrictions: No   Pertinent Vitals/Pain No c/o pain    ADL  Grooming: Performed;Wash/dry hands;Wash/dry face;Set up Where Assessed - Grooming: Unsupported sitting Upper Body Bathing: Performed;Set up Where Assessed - Upper Body Bathing: Unsupported sitting Lower Body Bathing: Performed;Supervision/safety;Set up;Min guard Where Assessed - Lower Body Bathing: Unsupported sitting;Supported sit to stand Upper Body Dressing: Performed;Set up Where Assessed - Upper Body Dressing: Unsupported sitting Lower Body Dressing: Performed;Supervision/safety;Set up;Min guard Where Assessed - Lower Body Dressing: Unsupported sitting;Supported sit to stand Toilet Transfer: Performed;Modified independent Toilet Transfer Method: Sit to Loss adjuster, chartered: Regular height toilet;Grab bars;Bedside commode Toileting - Clothing  Manipulation and Hygiene: Performed;Min guard Where Assessed - Best boy and Hygiene: Standing Tub/Shower Transfer: Simulated;Minimal assistance;Min guard (simulated for tub shower) Tub/Shower Transfer Method: Ambulating Equipment Used: Rolling walker    OT Diagnosis:    OT Problem List:   OT Treatment Interventions:     OT Goals(current goals can now be found in the care plan section) Acute Rehab OT Goals Patient Stated Goal: go home  Visit Information  Last OT Received On: 08/18/13 Assistance Needed: +1 History of Present Illness: 68 yo female admitted with CHF, probable Pna. Hx of CAD, CABG, MI, DM DVT. Pt lives alone.     Subjective Data      Prior Functioning       Cognition  Cognition Arousal/Alertness: Awake/alert Behavior During Therapy: WFL for tasks assessed/performed Overall Cognitive Status: Within Functional Limits for tasks assessed    Mobility  Bed Mobility General bed mobility comments: pt up in recliner Transfers Overall transfer level: Modified independent Equipment used: Rolling walker (2 wheeled) Transfers: Sit to/from Stand Sit to Stand: Modified independent (Device/Increase time) General transfer comment:  (Pt donned L AFO/shoe and R shoe prior to standing.)          Balance Balance Overall balance assessment: Needs assistance Sitting-balance support: No upper extremity supported;Feet supported Sitting balance-Leahy Scale: Good Standing balance support: Single extremity supported;No upper extremity supported;During functional activity Standing balance-Leahy Scale: Fair  End of Session OT - End of Session Equipment Utilized During Treatment: Rolling walker Activity Tolerance: Patient tolerated treatment well Patient left: in chair;with call bell/phone within reach  GO     Britt Bottom 08/18/2013, 1:25 PM

## 2013-08-18 NOTE — Progress Notes (Signed)
[facility] PROGRESS NOTE  Filed Weights   08/16/13 2683 08/17/13 0634 08/18/13 0602  Weight: 51.12 kg (112 lb 11.2 oz) 51.2 kg (112 lb 14 oz) 51.393 kg (113 lb 4.8 oz)        Intake/Output Summary (Last 24 hours) at 08/18/13 2146 Last data filed at 08/18/13 2012  Gross per 24 hour  Intake   1300 ml  Output   1525 ml  Net   -225 ml    Assessment/Plan: Acute respiratory failure due to  Acute combined systolic and diastolic congestive heart failure and right sided/PNA: - Started on aggressive diuretic with lasix an metolazone, Cr has now increase to 6.53 and worsening BUN. Urine output has improved some. - On oral lasix; no SOB, JVD or crackles on exam. - Cont metoprolol. Finish course of antibiotics on 2.7.2014. - Appriciate advance heart failure team and nephrology assitance. -will need ischemic work up with cardiology as an outpatient once dialysis started.  Cardiorenal syndrome CKD V: - renal on board -patient borderline uremic, with SCr 6.53, BUN 110 and Phosphorus of 7.0 - Continue lasix at current dose for now -plan is for diatek cath placement and HD initiation -once patient condition is more stable future plans are for PD cath placement and switch to PD  Type II or unspecified type diabetes mellitus without mention of complication, uncontrolled - HbgA1c 7.4, cont SSI and glipizide. - stable  HYPERTENSION - BP stable. - no further changes.  Physical decondition: -will need SNF at discharge    Code Status: Full  Family Communication: Pt at bedside. Disposition Plan: SNF for rehab; once HD/clipping process initiaded   Consultants:  Advance heart failure  renal  Procedures: ECHO 4.1.9622: Systolic function was severely reduced. The estimated ejection fraction was in the range of 20% to 25%. Doppler parameters are consistent with a restrictive pattern, indicative of decreased left ventricular diastolic compliance and/or increased left atrial pressure  (grade 3 diastolic dysfunction). Doppler parameters are consistent with both elevated ventricular end-diastolic filling pressure and elevated left atrial filling pressure.   Antibiotics:  None  HPI/Subjective: No CP, no SOB. Feeling tired and weak.  Objective: Filed Vitals:   08/18/13 1500 08/18/13 1700 08/18/13 1814 08/18/13 2021  BP: 124/49 129/38 140/57 121/52  Pulse: 90 95 97 91  Temp: 97.6 F (36.4 C)   98.3 F (36.8 C)  TempSrc: Oral   Oral  Resp: 18   16  Height:      Weight:      SpO2: 98%   99%     Exam: General: Alert, awake, oriented x3, in no acute distress.  HEENT: No bruits, no goiter. No JVD Heart: Regular rate and rhythm, without murmurs, rubs, gallops.  Lungs: Good air movement, clear to auscultation. Abdomen: Soft, nontender, nondistended, positive bowel sounds.    Data Reviewed: Basic Metabolic Panel:  Recent Labs Lab 08/16/13 0557 08/16/13 1555 08/17/13 0310 08/17/13 1630 08/18/13 0347  NA 136* 138 134* 136* 134*  K 5.0 4.3 4.2 4.7 4.3  CL 93* 89* 89* 89* 89*  CO2 23 29 25 28 26   GLUCOSE 141* 88 164* 174* 188*  BUN 87* 92* 93* 98* 110*  CREATININE 5.11* 5.57* 5.46* 6.50* 6.53*  CALCIUM 10.0 10.3 9.4 9.6 9.2  PHOS 6.1* 6.6* 7.0* 7.1* 7.0*   Liver Function Tests:  Recent Labs Lab 08/16/13 0557 08/16/13 1555 08/17/13 0310 08/17/13 1630 08/18/13 0347  ALBUMIN 3.1* 3.1* 3.0* 3.1* 3.0*   CBC:  Recent Labs Lab 08/15/13  0411  WBC 10.4  HGB 9.5*  HCT 28.9*  MCV 82.1  PLT 240   BNP (last 3 results)  Recent Labs  08/16/2013 1609  PROBNP 22793.0*   CBG:  Recent Labs Lab 08/17/13 2122 08/18/13 0622 08/18/13 1124 08/18/13 1632 08/18/13 2105  GLUCAP 194* 168* 227* 169* 137*    Recent Results (from the past 240 hour(s))  CULTURE, BLOOD (ROUTINE X 2)     Status: None   Collection Time    08/17/2013 10:10 PM      Result Value Ref Range Status   Specimen Description BLOOD RIGHT ARM   Final   Special Requests BOTTLES  DRAWN AEROBIC AND ANAEROBIC 5CC   Final   Culture  Setup Time     Final   Value: 08/10/2013 03:19     Performed at [facility]   Culture     Final   Value: NO GROWTH 5 DAYS     Performed at [facility]   Report Status 08/16/2013 FINAL   Final  CULTURE, BLOOD (ROUTINE X 2)     Status: None   Collection Time    08/11/2013 10:15 PM      Result Value Ref Range Status   Specimen Description BLOOD LEFT ARM   Final   Special Requests BOTTLES DRAWN AEROBIC ONLY 10CC   Final   Culture  Setup Time     Final   Value: 08/10/2013 03:19     Performed at [facility]   Culture     Final   Value: NO GROWTH 5 DAYS     Performed at [facility]   Report Status 08/16/2013 FINAL   Final  URINE CULTURE     Status: None   Collection Time    08/13/13 12:26 AM      Result Value Ref Range Status   Specimen Description URINE, RANDOM   Final   Special Requests NONE   Final   Culture  Setup Time     Final   Value: 08/13/2013 08:48     Performed at [facility]     Final   Value: NO GROWTH     Performed at [facility]   Culture     Final   Value: NO GROWTH     Performed at [facility]   Report Status 08/14/2013 FINAL   Final     Studies: No results found.  Scheduled Meds: . antiseptic oral rinse  15 mL Mouth Rinse BID  . aspirin  81 mg Oral Daily  . calcitRIOL  0.25 mcg Oral Once per day on Mon Wed Fri  . calcium acetate  1,334 mg Oral TID WC  . carvedilol  3.125 mg Oral BID WC  . citalopram  10 mg Oral Daily  . darbepoetin (ARANESP) injection - NON-DIALYSIS  60 mcg Subcutaneous Q Wed-1800  . ferric gluconate (FERRLECIT/NULECIT) IV  125 mg Intravenous Daily  . glimepiride  0.5 mg Oral Q breakfast  . heparin  5,000 Units Subcutaneous 3 times per day  . insulin aspart  0-9 Units Subcutaneous TID WC  . isosorbide-hydrALAZINE  1 tablet Oral TID  . loratadine  10 mg Oral Daily  . simvastatin  10 mg Oral q1800    Continuous Infusions:   Time > 30 minutes  Veronica Copeland  [facility] Pager [phone] If 8PM-8AM, please contact night-coverage at [email or web address], password [facility] 08/18/2013, 9:46 PM  LOS: 9 days

## 2013-08-19 ENCOUNTER — Inpatient Hospital Stay (HOSPITAL_COMMUNITY): Payer: Medicare Other

## 2013-08-19 ENCOUNTER — Encounter (HOSPITAL_COMMUNITY): Payer: Medicare Other | Admitting: Anesthesiology

## 2013-08-19 ENCOUNTER — Encounter (HOSPITAL_COMMUNITY): Payer: Medicare Other

## 2013-08-19 ENCOUNTER — Inpatient Hospital Stay (HOSPITAL_COMMUNITY): Payer: Medicare Other | Admitting: Anesthesiology

## 2013-08-19 ENCOUNTER — Encounter (HOSPITAL_COMMUNITY): Admission: EM | Disposition: E | Payer: Self-pay | Source: Home / Self Care | Attending: Internal Medicine

## 2013-08-19 ENCOUNTER — Encounter: Payer: Medicare Other | Admitting: Cardiovascular Disease

## 2013-08-19 ENCOUNTER — Encounter (HOSPITAL_COMMUNITY): Payer: Self-pay | Admitting: Anesthesiology

## 2013-08-19 DIAGNOSIS — N184 Chronic kidney disease, stage 4 (severe): Secondary | ICD-10-CM

## 2013-08-19 DIAGNOSIS — I4891 Unspecified atrial fibrillation: Secondary | ICD-10-CM

## 2013-08-19 DIAGNOSIS — N186 End stage renal disease: Secondary | ICD-10-CM

## 2013-08-19 HISTORY — PX: INSERTION OF DIALYSIS CATHETER: SHX1324

## 2013-08-19 LAB — GLUCOSE, CAPILLARY
GLUCOSE-CAPILLARY: 74 mg/dL (ref 70–99)
GLUCOSE-CAPILLARY: 134 mg/dL — AB (ref 70–99)
Glucose-Capillary: 209 mg/dL — ABNORMAL HIGH (ref 70–99)
Glucose-Capillary: 109 mg/dL — ABNORMAL HIGH (ref 70–99)

## 2013-08-19 LAB — RENAL FUNCTION PANEL
ALBUMIN: 3.4 g/dL — AB (ref 3.5–5.2)
BUN: 127 mg/dL — ABNORMAL HIGH (ref 6–23)
CALCIUM: 10 mg/dL (ref 8.4–10.5)
CO2: 28 mEq/L (ref 19–32)
Chloride: 86 mEq/L — ABNORMAL LOW (ref 96–112)
Creatinine, Ser: 6.7 mg/dL — ABNORMAL HIGH (ref 0.50–1.10)
GFR calc Af Amer: 7 mL/min — ABNORMAL LOW (ref >90)
GFR calc non Af Amer: 6 mL/min — ABNORMAL LOW (ref >90)
Glucose, Bld: 184 mg/dL — ABNORMAL HIGH (ref 70–99)
PHOSPHORUS: 6.9 mg/dL — AB (ref 2.3–4.6)
POTASSIUM: 4.5 meq/L (ref 3.7–5.3)
SODIUM: 132 meq/L — AB (ref 137–147)

## 2013-08-19 LAB — HEPATITIS B SURFACE ANTIGEN: HEP B S AG: NEGATIVE

## 2013-08-19 LAB — HEPATITIS B CORE ANTIBODY, TOTAL: Hep B Core Total Ab: NONREACTIVE

## 2013-08-19 LAB — HEPATITIS B SURFACE ANTIBODY,QUALITATIVE: Hep B S Ab: NEGATIVE

## 2013-08-19 SURGERY — INSERTION OF DIALYSIS CATHETER
Anesthesia: Monitor Anesthesia Care | Site: Neck

## 2013-08-19 MED ORDER — SODIUM CHLORIDE 0.9 % IV SOLN
100.0000 mL | INTRAVENOUS | Status: DC | PRN
Start: 2013-08-19 — End: 2013-08-19

## 2013-08-19 MED ORDER — 0.9 % SODIUM CHLORIDE (POUR BTL) OPTIME
TOPICAL | Status: DC | PRN
  Administered 2013-08-19: 1000 mL

## 2013-08-19 MED ORDER — HEPARIN SODIUM (PORCINE) 1000 UNIT/ML DIALYSIS: 1000.0000 [IU] | INTRAMUSCULAR | Status: DC | PRN

## 2013-08-19 MED ORDER — HEPARIN SODIUM (PORCINE) 1000 UNIT/ML IJ SOLN
INTRAMUSCULAR | Status: AC
  Filled 2013-08-19: qty 1

## 2013-08-19 MED ORDER — LIDOCAINE HCL (PF) 1 % IJ SOLN
5.0000 mL | INTRAMUSCULAR | Status: DC | PRN
Start: 2013-08-19 — End: 2013-08-19

## 2013-08-19 MED ORDER — ONDANSETRON HCL 4 MG/2ML IJ SOLN: 4.0000 mg | Freq: Once | INTRAMUSCULAR | Status: DC | PRN

## 2013-08-19 MED ORDER — LIDOCAINE HCL (PF) 1 % IJ SOLN
INTRAMUSCULAR | Status: DC | PRN
  Administered 2013-08-19: 30 mL

## 2013-08-19 MED ORDER — OXYCODONE-ACETAMINOPHEN 5-325 MG PO TABS
ORAL_TABLET | ORAL | Status: AC
  Administered 2013-08-19: 2 via ORAL
  Filled 2013-08-19: qty 2

## 2013-08-19 MED ORDER — PENTAFLUOROPROP-TETRAFLUOROETH EX AERO: 1.0000 "application " | INHALATION_SPRAY | CUTANEOUS | Status: DC | PRN

## 2013-08-19 MED ORDER — SODIUM CHLORIDE 0.9 % IJ SOLN: 3.0000 mL | Freq: Two times a day (BID) | INTRAMUSCULAR | Status: DC

## 2013-08-19 MED ORDER — HEPARIN SODIUM (PORCINE) 1000 UNIT/ML IJ SOLN
INTRAMUSCULAR | Status: DC | PRN
Start: 2013-08-19 — End: 2013-08-19
  Administered 2013-08-19: 1000 [IU]

## 2013-08-19 MED ORDER — SODIUM CHLORIDE 0.9 % IV SOLN: 250.0000 mL | INTRAVENOUS | Status: DC | PRN

## 2013-08-19 MED ORDER — SODIUM CHLORIDE 0.9 % IV SOLN
INTRAVENOUS | Status: DC
  Administered 2013-08-19: 10 mL/h via INTRAVENOUS
  Administered 2013-08-24 (×2): via INTRAVENOUS

## 2013-08-19 MED ORDER — LIDOCAINE HCL (PF) 1 % IJ SOLN: 5.0000 mL | INTRAMUSCULAR | Status: DC | PRN

## 2013-08-19 MED ORDER — SODIUM CHLORIDE 0.9 % IV SOLN: 100.0000 mL | INTRAVENOUS | Status: DC | PRN

## 2013-08-19 MED ORDER — ALTEPLASE 100 MG IV SOLR
5.0000 mg | Freq: Once | INTRAVENOUS | Status: DC
  Filled 2013-08-19: qty 5

## 2013-08-19 MED ORDER — SODIUM CHLORIDE 0.9 % IR SOLN
Status: DC | PRN
  Administered 2013-08-19: 11:00:00

## 2013-08-19 MED ORDER — ASPIRIN 81 MG PO CHEW
81.0000 mg | CHEWABLE_TABLET | ORAL | Status: AC
  Administered 2013-08-20: 81 mg via ORAL
  Filled 2013-08-19: qty 1

## 2013-08-19 MED ORDER — SODIUM CHLORIDE 0.9 % IV SOLN: 5.0000 mg | Freq: Once | INTRAVENOUS | Status: DC

## 2013-08-19 MED ORDER — OXYCODONE-ACETAMINOPHEN 5-325 MG PO TABS
1.0000 | ORAL_TABLET | ORAL | Status: DC | PRN
  Administered 2013-08-19: 2 via ORAL
  Administered 2013-08-19: 1 via ORAL
  Administered 2013-08-21 – 2013-08-23 (×4): 2 via ORAL
  Filled 2013-08-19 (×2): qty 2
  Filled 2013-08-19: qty 1
  Filled 2013-08-19 (×2): qty 2

## 2013-08-19 MED ORDER — NEPRO/CARBSTEADY PO LIQD: 237.0000 mL | ORAL | Status: DC | PRN

## 2013-08-19 MED ORDER — PROPOFOL 10 MG/ML IV BOLUS
INTRAVENOUS | Status: AC
  Filled 2013-08-19: qty 20

## 2013-08-19 MED ORDER — SODIUM CHLORIDE 0.9 % IV SOLN
INTRAVENOUS | Status: DC
Start: 2013-08-20 — End: 2013-08-20
  Administered 2013-08-20: 10 mL/h via INTRAVENOUS

## 2013-08-19 MED ORDER — LIDOCAINE HCL (PF) 1 % IJ SOLN
INTRAMUSCULAR | Status: AC
  Filled 2013-08-19: qty 30

## 2013-08-19 MED ORDER — VANCOMYCIN HCL IN DEXTROSE 1-5 GM/200ML-% IV SOLN
1000.0000 mg | Freq: Once | INTRAVENOUS | Status: AC
  Administered 2013-08-19: 500 mg via INTRAVENOUS
  Filled 2013-08-19 (×2): qty 200

## 2013-08-19 MED ORDER — PROPOFOL INFUSION 10 MG/ML OPTIME
INTRAVENOUS | Status: DC | PRN
Start: 2013-08-19 — End: 2013-08-19
  Administered 2013-08-19: 25 ug/kg/min via INTRAVENOUS

## 2013-08-19 MED ORDER — SODIUM CHLORIDE 0.9 % IV SOLN
INTRAVENOUS | Status: DC | PRN
  Administered 2013-08-19: 11:00:00 via INTRAVENOUS

## 2013-08-19 MED ORDER — ALTEPLASE 2 MG IJ SOLR
2.0000 mg | Freq: Once | INTRAMUSCULAR | Status: DC | PRN
  Filled 2013-08-19: qty 2

## 2013-08-19 MED ORDER — FENTANYL CITRATE 0.05 MG/ML IJ SOLN: 25.0000 ug | INTRAMUSCULAR | Status: DC | PRN

## 2013-08-19 MED ORDER — SODIUM CHLORIDE 0.9 % IJ SOLN: 3.0000 mL | INTRAMUSCULAR | Status: DC | PRN

## 2013-08-19 MED ORDER — LIDOCAINE-PRILOCAINE 2.5-2.5 % EX CREA: 1.0000 "application " | TOPICAL_CREAM | CUTANEOUS | Status: DC | PRN

## 2013-08-19 MED ORDER — FENTANYL CITRATE 0.05 MG/ML IJ SOLN
INTRAMUSCULAR | Status: AC
  Filled 2013-08-19: qty 5

## 2013-08-19 MED ORDER — MIDAZOLAM HCL 2 MG/2ML IJ SOLN
INTRAMUSCULAR | Status: AC
  Filled 2013-08-19: qty 2

## 2013-08-19 MED ORDER — ALTEPLASE 2 MG IJ SOLR
2.0000 mg | Freq: Once | INTRAMUSCULAR | Status: AC | PRN
  Administered 2013-08-19: 4.2 mg
  Filled 2013-08-19: qty 2

## 2013-08-19 MED ORDER — NEPRO/CARBSTEADY PO LIQD
237.0000 mL | ORAL | Status: DC | PRN
  Filled 2013-08-19: qty 237

## 2013-08-19 MED ORDER — MORPHINE SULFATE 2 MG/ML IJ SOLN
1.0000 mg | INTRAMUSCULAR | Status: AC | PRN
  Administered 2013-08-19 – 2013-08-20 (×2): 1 mg via INTRAVENOUS
  Filled 2013-08-19 (×2): qty 1

## 2013-08-19 SURGICAL SUPPLY — 63 items
ARMBAND PINK RESTRICT EXTREMIT (MISCELLANEOUS) ×4 IMPLANT
BAG DECANTER FOR FLEXI CONT (MISCELLANEOUS) ×4 IMPLANT
CANISTER SUCTION 2500CC (MISCELLANEOUS) ×4 IMPLANT
CATH CANNON HEMO 15F 50CM (CATHETERS) IMPLANT
CATH CANNON HEMO 15FR 19 (HEMODIALYSIS SUPPLIES) ×4 IMPLANT
CATH CANNON HEMO 15FR 23CM (HEMODIALYSIS SUPPLIES) IMPLANT
CATH CANNON HEMO 15FR 31CM (HEMODIALYSIS SUPPLIES) IMPLANT
CATH CANNON HEMO 15FR 32CM (HEMODIALYSIS SUPPLIES) IMPLANT
CHLORAPREP W/TINT 26ML (MISCELLANEOUS) ×4 IMPLANT
CLIP TI MEDIUM 6 (CLIP) IMPLANT
CLIP TI WIDE RED SMALL 6 (CLIP) IMPLANT
COVER PROBE W GEL 5X96 (DRAPES) ×4 IMPLANT
COVER SURGICAL LIGHT HANDLE (MISCELLANEOUS) ×4 IMPLANT
DECANTER SPIKE VIAL GLASS SM (MISCELLANEOUS) ×4 IMPLANT
DERMABOND ADHESIVE PROPEN (GAUZE/BANDAGES/DRESSINGS) ×2
DERMABOND ADVANCED (GAUZE/BANDAGES/DRESSINGS) ×2
DERMABOND ADVANCED .7 DNX12 (GAUZE/BANDAGES/DRESSINGS) ×2 IMPLANT
DERMABOND ADVANCED .7 DNX6 (GAUZE/BANDAGES/DRESSINGS) ×2 IMPLANT
DRAIN PENROSE 1/2X12 LTX STRL (WOUND CARE) IMPLANT
DRAPE C-ARM 42X72 X-RAY (DRAPES) ×4 IMPLANT
DRAPE CHEST BREAST 15X10 FENES (DRAPES) ×4 IMPLANT
ELECT REM PT RETURN 9FT ADLT (ELECTROSURGICAL) ×4
ELECTRODE REM PT RTRN 9FT ADLT (ELECTROSURGICAL) ×2 IMPLANT
GAUZE SPONGE 2X2 8PLY NS (GAUZE/BANDAGES/DRESSINGS) ×4 IMPLANT
GAUZE SPONGE 2X2 8PLY STRL LF (GAUZE/BANDAGES/DRESSINGS) ×2 IMPLANT
GAUZE SPONGE 4X4 16PLY XRAY LF (GAUZE/BANDAGES/DRESSINGS) IMPLANT
GLOVE BIO SURGEON STRL SZ7.5 (GLOVE) ×4 IMPLANT
GLOVE BIOGEL PI IND STRL 6.5 (GLOVE) ×2 IMPLANT
GLOVE BIOGEL PI IND STRL 7.5 (GLOVE) ×2 IMPLANT
GLOVE BIOGEL PI IND STRL 8 (GLOVE) ×2 IMPLANT
GLOVE BIOGEL PI INDICATOR 6.5 (GLOVE) ×2
GLOVE BIOGEL PI INDICATOR 7.5 (GLOVE) ×2
GLOVE BIOGEL PI INDICATOR 8 (GLOVE) ×2
GLOVE ECLIPSE 7.0 STRL STRAW (GLOVE) ×4 IMPLANT
GOWN STRL REUS W/ TWL LRG LVL3 (GOWN DISPOSABLE) ×6 IMPLANT
GOWN STRL REUS W/TWL LRG LVL3 (GOWN DISPOSABLE) ×6
KIT BASIN OR (CUSTOM PROCEDURE TRAY) ×4 IMPLANT
KIT ROOM TURNOVER OR (KITS) ×4 IMPLANT
NEEDLE 18GX1X1/2 (RX/OR ONLY) (NEEDLE) ×4 IMPLANT
NEEDLE 22X1 1/2 (OR ONLY) (NEEDLE) ×4 IMPLANT
NEEDLE HYPO 25GX1X1/2 BEV (NEEDLE) ×4 IMPLANT
NS IRRIG 1000ML POUR BTL (IV SOLUTION) ×4 IMPLANT
PACK CV ACCESS (CUSTOM PROCEDURE TRAY) IMPLANT
PACK SURGICAL SETUP 50X90 (CUSTOM PROCEDURE TRAY) ×4 IMPLANT
PAD ARMBOARD 7.5X6 YLW CONV (MISCELLANEOUS) ×8 IMPLANT
SPONGE GAUZE 2X2 STER 10/PKG (GAUZE/BANDAGES/DRESSINGS) ×2
SPONGE GAUZE 4X4 12PLY (GAUZE/BANDAGES/DRESSINGS) ×4 IMPLANT
SPONGE SURGIFOAM ABS GEL 100 (HEMOSTASIS) IMPLANT
SUT ETHILON 3 0 PS 1 (SUTURE) ×4 IMPLANT
SUT PROLENE 6 0 BV (SUTURE) IMPLANT
SUT VIC AB 3-0 SH 27 (SUTURE)
SUT VIC AB 3-0 SH 27X BRD (SUTURE) IMPLANT
SUT VICRYL 4-0 PS2 18IN ABS (SUTURE) ×4 IMPLANT
SYR 20CC LL (SYRINGE) ×8 IMPLANT
SYR 30ML LL (SYRINGE) IMPLANT
SYR 5ML LL (SYRINGE) ×8 IMPLANT
SYR CONTROL 10ML LL (SYRINGE) ×4 IMPLANT
SYRINGE 10CC LL (SYRINGE) ×4 IMPLANT
TAPE CLOTH SURG 4X10 WHT LF (GAUZE/BANDAGES/DRESSINGS) ×4 IMPLANT
TOWEL OR 17X24 6PK STRL BLUE (TOWEL DISPOSABLE) ×4 IMPLANT
TOWEL OR 17X26 10 PK STRL BLUE (TOWEL DISPOSABLE) ×4 IMPLANT
UNDERPAD 30X30 INCONTINENT (UNDERPADS AND DIAPERS) ×4 IMPLANT
WATER STERILE IRR 1000ML POUR (IV SOLUTION) ×4 IMPLANT

## 2013-08-19 NOTE — Progress Notes (Signed)
VASCULAR SURGERY:  The patient was seen yesterday in consultation by Dr. Kellie Simmering. We have been asked to place an AV fistula in a tunneled dialysis catheter. Vein mapping shows somewhat small cephalic vein in the left arm. She has a reasonable sized basilic vein on the left. She is right-hand dominant.  Patient has significant congestive heart failure. Cardiology would like to proceed with heart catheterization however she needs to have access for dialysis prior to this. I have discussed the case with Dr. Ermalene Postin with anesthesia. Echo shows an ejection fraction of 20-30%. We both agree that it would be best to place catheter, alow her to complete her cardiac workup and then do the fistula electively. Given her significant cardiac issues, this would be the safest approach. Discussed with patient.  Deitra Mayo, MD, [place] [phone] 09/03/2013

## 2013-08-19 NOTE — Anesthesia Postprocedure Evaluation (Signed)
  Anesthesia Post-op Note  Patient: Veronica Copeland  Procedure(s) Performed: Procedure(s): INSERTION OF DIALYSIS CATHETER (N/A)  Patient Location: PACU  Anesthesia Type:MAC  Level of Consciousness: awake, alert  and oriented  Airway and Oxygen Therapy: Patient Spontanous Breathing  Post-op Pain: mild  Post-op Assessment: Post-op Vital signs reviewed, Patient's Cardiovascular Status Stable, Respiratory Function Stable, Patent Airway, No signs of Nausea or vomiting and Pain level controlled  Post-op Vital Signs: Reviewed and stable  Complications: No apparent anesthesia complications

## 2013-08-19 NOTE — Procedures (Signed)
I was present at this session.  I have reviewed the session itself and made appropriate changes.  1st HD via PC. Low BFR, tol well.  Will need fistula soon prior to D/C  Audery Wassenaar L 2/12/20154:54 PM

## 2013-08-19 NOTE — H&P (View-Only) (Signed)
[facility] NOTE   MRN : [id number]  Reason for Consult: Dialysis access Referring Physician: Dr. Moshe Cipro   History of Present Illness: 68 y/o female who was admitted due to progressive shortness of breath requiring use of 2 pillows to improve dyspnea.  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  Echo shows EF of 20-30%.  hx of CAD s/p CABG, DVT not on coumadin, and DM.  We have been asked to provide permanent access and diatek.  Past medical history hypertension, hyperlipidemia, and DM.  She is on stain, insulin and Asprin daily.  She is on 5,000 SQ heparin TID.    Current Facility-Administered Medications  Medication Dose Route Frequency Provider Last Rate Last Dose  . antiseptic oral rinse (BIOTENE) solution 15 mL  15 mL Mouth Rinse BID Theodis Blaze, MD   15 mL at 08/18/13 0800  . aspirin chewable tablet 81 mg  81 mg Oral Daily Theodis Blaze, MD   81 mg at 08/18/13 1154  . calcitRIOL (ROCALTROL) capsule 0.25 mcg  0.25 mcg Oral Once per day on Mon Wed Fri Theodis Blaze, MD   0.25 mcg at 08/18/13 1458  . calcium acetate (PHOSLO) capsule 1,334 mg  1,334 mg Oral TID WC Rexene Agent, MD   1,334 mg at 08/18/13 1153  . carvedilol (COREG) tablet 3.125 mg  3.125 mg Oral BID WC Charlynne Cousins, MD   3.125 mg at 08/18/13 7371  . citalopram (CELEXA) tablet 10 mg  10 mg Oral Daily Theodis Blaze, MD   10 mg at 08/18/13 1154  . darbepoetin (ARANESP) injection 60 mcg  60 mcg Subcutaneous Q Wed-1800 Louis Meckel, MD      . ferric gluconate (NULECIT) 125 mg in sodium chloride 0.9 % 100 mL IVPB  125 mg Intravenous Daily Louis Meckel, MD   125 mg at 08/18/13 1459  . fluticasone (FLONASE) 50 MCG/ACT nasal spray 2 spray  2 spray Each Nare Daily PRN Theodis Blaze, MD      . glimepiride (AMARYL) tablet 0.5 mg  0.5 mg Oral Q breakfast Theodis Blaze, MD   0.5 mg at 08/18/13 1026  . heparin  injection 5,000 Units  5,000 Units Subcutaneous 3 times per day Theodis Blaze, MD   5,000 Units at 08/18/13 1501  . hydrALAZINE (APRESOLINE) injection 5 mg  5 mg Intravenous Q6H PRN Theodis Blaze, MD      . HYDROcodone-acetaminophen (NORCO/VICODIN) 5-325 MG per tablet 1 tablet  1 tablet Oral Q6H PRN Theodis Blaze, MD   1 tablet at 08/17/13 2201  . insulin aspart (novoLOG) injection 0-9 Units  0-9 Units Subcutaneous TID WC Theodis Blaze, MD   3 Units at 08/18/13 1200  . isosorbide-hydrALAZINE (BIDIL) 20-37.5 MG per tablet 1 tablet  1 tablet Oral TID Charlynne Cousins, MD   1 tablet at 08/18/13 1153  . loratadine (CLARITIN) tablet 10 mg  10 mg Oral Daily Theodis Blaze, MD   10 mg at 08/18/13 1154  . nitroGLYCERIN (NITROSTAT) SL tablet 0.4 mg  0.4 mg Sublingual Q5 min PRN Theodis Blaze, MD      . ondansetron [facility]) tablet 4 mg  4 mg Oral Q6H PRN Theodis Blaze, MD       Or  . ondansetron [facility]) injection 4 mg  4 mg Intravenous Q6H PRN Theodis Blaze,  MD   4 mg at 08/12/13 0036  . polyethylene glycol (MIRALAX / GLYCOLAX) packet 17 g  17 g Oral Daily PRN Charlynne Cousins, MD   17 g at 08/17/13 1726  . simvastatin (ZOCOR) tablet 10 mg  10 mg Oral q1800 Theodis Blaze, MD   10 mg at 08/17/13 1725  . traMADol (ULTRAM) tablet 50 mg  50 mg Oral Q6H PRN Theodis Blaze, MD   50 mg at 08/18/13 1154    Pt meds include: Statin :Yes Betablocker: No ASA: Yes Other anticoagulants/antiplatelets:   Past Medical History  Diagnosis Date  . CAD (coronary artery disease)     s/p CABG  . HLD (hyperlipidemia)   . HTN (hypertension)   . Acute MI   . Lung nodule   . Anemia   . Allergic rhinitis   . Osteopenia   . Vaginitis   . Ganglion of joint   . Depression   . DVT (deep venous thrombosis)   . Paraplegia 2010    "used walker since they took vein out of LLE for CABG" (08/21/2013)  . Anemia   . GERD (gastroesophageal reflux disease)   . Peripheral vascular disease 08/14/12    cancellrd scheduled  appointment with Dr. Quay Burow  . Pneumonia     "once" (08/24/2013)  . Shortness of breath     "all the time today" (08/19/2013)  . Type II diabetes mellitus   . Stroke 2010  . Chronic kidney disease     "think dialysis is in my future; don't know exactly what the problem is" (08/17/2013)    Past Surgical History  Procedure Laterality Date  . Shoulder arthroscopy w/ rotator cuff repair Right   . Cesarean section    . Coronary artery bypass graft  03/2009  . Laminectomy  2010  . Excisional hemorrhoidectomy  1980's  . Vaginal hysterectomy    . Dilation and curettage of uterus    . Back surgery    . Cataract extraction, bilateral Bilateral     Social History History  Substance Use Topics  . Smoking status: Never Smoker   . Smokeless tobacco: Never Used  . Alcohol Use: No    Family History Family History  Problem Relation Age of Onset  . Diabetes    . Hypertension    . Stroke    . Coronary artery disease      Allergies  Allergen Reactions  . Penicillins Other (See Comments)    Yeast      REVIEW OF SYSTEMS  General: [ ]  Weight loss, [ ]  Fever, [ ]  chills Neurologic: [ ]  Dizziness, [ ]  Blackouts, [ ]  Seizure [ ]  Stroke, [ ]  "Mini stroke", [ ]  Slurred speech, [ ]  Temporary blindness; [ ]  weakness in arms or legs, [ ]  Hoarseness [ ]  Dysphagia Cardiac: [ ]  Chest pain/pressure, [x ] Shortness of breath at rest [ ]  Shortness of breath with exertion, [ ]  Atrial fibrillation or irregular heartbeat  Vascular: [ ]  Pain in legs with walking, [ ]  Pain in legs at rest, [ ]  Pain in legs at night,  [ ]  Non-healing ulcer, [ ]  Blood clot in vein/DVT,   Pulmonary: [ ]  Home oxygen, [ ]  Productive cough, [ ]  Coughing up blood, [ ]  Asthma,  [ ]  Wheezing [ ]  COPD Musculoskeletal:  [ ]  Arthritis, [ ]  Low back pain, [ ]  Joint pain Hematologic: [ ]  Easy Bruising, [ ]  Anemia; [ ]  Hepatitis Gastrointestinal: [ ]  Blood  in stool, [ ]  Gastroesophageal Reflux/heartburn, Urinary: [ ]  chronic  Kidney disease, [ ]  on HD - [ ]  MWF or [ ]  TTHS, [ ]  Burning with urination, [ ]  Difficulty urinating Skin: [ ]  Rashes, [ ]  Wounds Psychological: [ ]  Anxiety, [ ]  Depression  Physical Examination Filed Vitals:   08/17/13 1500 08/17/13 2100 08/18/13 0602 08/18/13 1032  BP: 126/48 132/47 142/52 129/45  Pulse: 80 87 96 92  Temp: 97.6 F (36.4 C) 98.2 F (36.8 C) 97.9 F (36.6 C)   TempSrc: Oral Oral Oral   Resp:  17 18   Height:      Weight:   113 lb 4.8 oz (51.393 kg)   SpO2: 98% 100% 98%    Body mass index is 19.61 kg/(m^2).  General:  WDWN in NAD Gait: Normal HENT: WNL Eyes: Pupils equal Pulmonary: normal non-labored breathing , without Rales, rhonchi,  wheezing Cardiac: RRR, without  Murmurs, rubs or gallops; No carotid bruits Abdomen: soft, NT, no masses Skin: no rashes, ulcers noted;  no Gangrene , no cellulitis; no open wounds;   Vascular Exam/Pulses:palpable radial and brachial pulses bilaterally.     Musculoskeletal: no muscle wasting or atrophy; no edema  Neurologic: A&O X 3; Appropriate Affect ;  SENSATION: normal; MOTOR FUNCTION: 5/5 Symmetric, left foot drop uses brace for ambulation Speech is fluent/normal   Significant Diagnostic Studies: CBC Lab Results  Component Value Date   WBC 10.4 08/15/2013   HGB 9.5* 08/15/2013   HCT 28.9* 08/15/2013   MCV 82.1 08/15/2013   PLT 240 08/15/2013    BMET    Component Value Date/Time   NA 134* 08/18/2013 0347   K 4.3 08/18/2013 0347   CL 89* 08/18/2013 0347   CO2 26 08/18/2013 0347   GLUCOSE 188* 08/18/2013 0347   BUN 110* 08/18/2013 0347   CREATININE 6.53* 08/18/2013 0347   CALCIUM 9.2 08/18/2013 0347   GFRNONAA 6* 08/18/2013 0347   GFRAA 7* 08/18/2013 0347   Estimated Creatinine Clearance: 6.6 ml/min (by C-G formula based on Cr of 6.53).  COAG Lab Results  Component Value Date   INR 1.1 03/29/2009   INR 0.9 03/27/2009   INR 1.0 10/14/2008     Non-Invasive Vascular Imaging:  Pending vein  mapping  ASSESSMENT/PLAN:  Acute on CKD She is right hand dominant Plan for diatek placement with possible AV fistula creation tomorrow Vein mapping ordered    Laurence Slate [facility] 08/18/2013 3:45 PM  Agree with above assessment Patient appears to have satisfactory left upper arm cephalic vein on physical exam but awake and vein mapping which has been ordered Plan insertion hemodialysis catheter plus probable left arm AV fistula tomorrow per Dr. Aldean Jewett with patient and she is agreeable

## 2013-08-19 NOTE — Progress Notes (Signed)
Pt returned from HD at 1950 pm c/o severe pain on her HD catheter incision, pain Oxi IR before coming to the unit by RN on dialysis with no relieve, Norco PO given on the unit with no relieve. MD notified and asked to review for another option for pain relieve, Morphine 1 Mg IV ordered and given, pt refers some relieve after Morphine IV and Tramadol PO given. Pt refers she will like to wait for her son before signing any consent for the heart cath schedule tomorrow. Pt refuses at this time to be prep by the tech, we"ll try early in the morning. Pt encouraged to keep NPO after MN. We'll continue with POC.

## 2013-08-19 NOTE — Transfer of Care (Signed)
Immediate Anesthesia Transfer of Care Note  Patient: Veronica Copeland  Procedure(s) Performed: Procedure(s): INSERTION OF DIALYSIS CATHETER (N/A)  Patient Location: PACU  Anesthesia Type:MAC  Level of Consciousness: awake, alert  and oriented  Airway & Oxygen Therapy: Patient Spontanous Breathing and Patient connected to face mask oxygen  Post-op Assessment: Report given to PACU RN  Post vital signs: Reviewed and stable  Complications: No apparent anesthesia complications

## 2013-08-19 NOTE — Progress Notes (Signed)
Catheter ran poorly during hemodialysis Tx. New catheter inserted today. HD Tx stopped 8 min early to prevent from loosing pts blood and cath was activased for the night. First hemodialysis Tx. Tolerated well if catheter had run.

## 2013-08-19 NOTE — Progress Notes (Signed)
Admit: 08/13/2013 LOS: 10  74F AoCKD in setting of biventricular S/D HF, volume overload with SOB, orthopnea and cough.  Renal failure has progressed during hospitalization-   Subjective: Plan has changed again- I saw patient this AM to try and clarify.  I was going to try and place PD cath hoping that she could make it a few weeks before needing HD.  They are concerned mostly at surgical risk as cardiac situation is not fully defined.  In the meantime, her numbers continue to get worse so she has likely declared herself as ESRD- she understands now the rationale of just going ahead and getting HD started, she then could maybe get cardiac cath and PD could be done down the road once she is a little "healthier"    02/11 0701 - 02/12 0700 In: 29 [P.O.:820] Out: 1150 [Urine:1150]  Filed Weights   08/17/13 0634 08/18/13 0602 08/28/2013 0621  Weight: 51.2 kg (112 lb 14 oz) 51.393 kg (113 lb 4.8 oz) 51.619 kg (113 lb 12.8 oz)    Current meds: reviewed  Current Labs: reviewed    Physical Exam:  Blood pressure 129/41, pulse 90, temperature 97.9 F (36.6 C), temperature source Oral, resp. rate 18, height 5' 3.75" (1.619 m), weight 51.619 kg (113 lb 12.8 oz), SpO2 100.00%. NAD, sitting in chair, eating lunch RRR, no s3 or s4 Faint bibasilar crackles, L>R, nl wob No LEE S/nt/nd nabs No asterixus, aaox3 No rashes/lesions  Assessment/Plan  74F AoCKD in setting of biventricular S/D HF, volume overload with SOB, orthopnea and cough.  Renal failure has progressed during hospitalization- 1. Renal- AoCKD (BL SCr 2s; but was 3.1 07/2012 at [place]) 2/2 cardiorenal syndrome.  Definitely now showing progression.  It was previously difficult to know what exactly to do but now she is declaring herself.  Will go ahead and declare her ESRD- getting PC and perm access today (appreciate [facility] prompt service) followed by her first HD treatment- second tomorrow (Friday) Will also initiate search for OP  dialysis arrangements. PD will need to b down the road after she is hopefully more tuned.  2. Cardiac/Biventricular S/D CHF, acute on chronic; LVEF 20-25% + DD - has experienced drop in EF since last eval'd- cards is wanting to do cath and now that she has been declared ESRD seems like a good time to do- Dr. Haroldine Laws is aware.   3. Anemia- TSAT 18%; likely IDA + Anemia of CKD- s/p iron and on ESA 4. 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 HTN, stable- coreg and bidil    Ramsay Bognar A  08/28/2013, 9:33 AM   Recent Labs Lab 08/17/13 1630 08/18/13 0347 09/02/2013 0712  NA 136* 134* 132*  K 4.7 4.3 4.5  CL 89* 89* 86*  CO2 28 26 28   GLUCOSE 174* 188* 184*  BUN 98* 110* 127*  CREATININE 6.50* 6.53* 6.70*  CALCIUM 9.6 9.2 10.0  PHOS 7.1* 7.0* 6.9*    Recent Labs Lab 08/15/13 0411  WBC 10.4  HGB 9.5*  HCT 28.9*  MCV 82.1  PLT 240

## 2013-08-19 NOTE — Anesthesia Preprocedure Evaluation (Signed)
Anesthesia Evaluation  Patient identified by MRN, date of birth, ID band Patient awake    Reviewed: Allergy & Precautions, H&P , NPO status , Patient's Chart, lab work & pertinent test results  History of Anesthesia Complications Negative for: history of anesthetic complications  Airway Mallampati: II TM Distance: >3 FB Neck ROM: Full    Dental  (+) Teeth Intact   Pulmonary shortness of breath, at rest and lying, neg sleep apnea, neg COPD + rhonchi         Cardiovascular hypertension, Pt. on medications + CAD, + Past MI, + Peripheral Vascular Disease and +CHF Rhythm:Regular Rate:Tachycardia - Systolic murmurs    Neuro/Psych Anxiety CVA, No Residual Symptoms    GI/Hepatic Neg liver ROS, GERD-  ,  Endo/Other  diabetes, Type 2  Renal/GU ESRFRenal disease     Musculoskeletal   Abdominal   Peds  Hematology  (+) anemia ,   Anesthesia Other Findings   Reproductive/Obstetrics                           Anesthesia Physical Anesthesia Plan  ASA: IV  Anesthesia Plan: MAC   Post-op Pain Management:    Induction: Intravenous  Airway Management Planned: Mask  Additional Equipment: None  Intra-op Plan:   Post-operative Plan:   Informed Consent: I have reviewed the patients History and Physical, chart, labs and discussed the procedure including the risks, benefits and alternatives for the proposed anesthesia with the patient or authorized representative who has indicated his/her understanding and acceptance.   Dental advisory given  Plan Discussed with: CRNA and Surgeon  Anesthesia Plan Comments:         Anesthesia Quick Evaluation

## 2013-08-19 NOTE — Interval H&P Note (Signed)
History and Physical Interval Note:  08/28/2013 10:37 AM  Veronica Copeland  has presented today for surgery, with the diagnosis of End Stage Renal Disease  The various methods of treatment have been discussed with the patient and family. After consideration of risks, benefits and other options for treatment, the patient has consented to  Placement of a Diatek Catheter as a surgical intervention .  [name] history has been reviewed, patient examined, no change in status, stable for surgery.  I have reviewed the patient's chart and labs.  Questions were answered to the patient's satisfaction.     Glendia Olshefski S

## 2013-08-19 NOTE — Op Note (Signed)
08/15/2013  PREOP DIAGNOSIS: Chronic kidney disease  POSTOP DIAGNOSIS: Chronic kidney disease  PROCEDURE: Ultrasound guided placement of right IJ Diatek catheter  (19 cm) SURGEON: Judeth Cornfield. Scot Dock, MD, FACS  ASSIST: none  ANESTHESIA: local with sedation   EBL: minimal  FINDINGS: patent right IJ  TECHNIQUE: The patient was taken to the operating room and sedated by anesthesia. The neck and upper chest were prepped and draped in the usual sterile fashion. After the skin was anesthetized with 1% lidocaine, and under ultrasound guidance, the right IJ was cannulated and a guidewire introduced into the superior vena cava under fluoroscopic control. The tract over the wire was dilated and then the dilator and peel-away sheath were passed over the wire and the wire and dilator removed. The catheter was passed through the peel-away sheath and positioned in the right atrium. The exit site for the catheter was selected and the skin anesthetized between the 2 areas. The catheter was then brought through the tunnel, cut to the appropriate length, and the distal ports were attached. Both ports withdrew easily, were then flushed with heparinized saline and filled with concentrated heparin. The catheter was secured at its exit site with a 3-0 nylon suture. The IJ cannulation site was closed with a 4-0 subcuticular stitch. A sterile dressing was applied. The patient tolerated the procedure well and was transferred to the recovery room in stable condition. All needle and sponge counts were correct.  Deitra Mayo, MD, FACS [facility]: 08/29/2013 DATE OF DICTATION: 08/29/2013

## 2013-08-19 NOTE — Preoperative (Signed)
Beta Blockers   Reason not to administer Beta Blockers:Not Applicable 

## 2013-08-19 NOTE — Progress Notes (Signed)
Advanced Heart Failure Rounding Note   Subjective:    Veronica Copeland is a 68 y.o. female hx of CAD s/p CABG (2010), CKD IV (baseline Cr 2.8/GFR 15-20), DVT not on coumadin, DM, HTN and newly diagnosed systolic HF. EF 2011 60-65%   She has been doing fairly well and reports that she was able to get around well with a walker and perfrom all her ADLs until last Thursday after she had her pneummonia vaccine. She reports that following vaccine that over the next couple of days she noticed that she had SOB and orthopnea. Denies any CP (no CP with prior MI). Lives alone. She reports she follows closely with Dr. Jenny Reichmann and takes her medications as prescribed. Reports that she follows with renal closely and they discussed starting HD or renal transplant however it was placed on hold because she had mass found in R breast and they were concerned cancer, however CT showed incidental mass.   Pertinent labs on admission were pro-BNP 22,000, Cr 2.9, HIV negative, TSH 1.11 SBPs 120-150   Echo 08/10/13:  LVEF 20-25% with regional wall motion abnormalities. Moderate RV dysfunction. +restrictive physiology   Started on milrinone with no improvement. Milrinone stopped due to worsening renal function.  S/P dialysis cath today.      Denies SOB    Objective:   Weight Range:  Vital Signs:   Temp:  [97.6 F (36.4 C)-98.5 F (36.9 C)] 98.1 F (36.7 C) (02/12 1422) Pulse Rate:  [87-97] 93 (02/12 1422) Resp:  [16-21] 20 (02/12 1422) BP: (110-140)/(38-69) 137/69 mmHg (02/12 1422) SpO2:  [98 %-100 %] 100 % (02/12 1422) Weight:  [113 lb 12.8 oz (51.619 kg)] 113 lb 12.8 oz (51.619 kg) (02/12 0621) Last BM Date: 08/15/13  Weight change: Filed Weights   08/17/13 0634 08/18/13 0602 08/12/2013 0621  Weight: 112 lb 14 oz (51.2 kg) 113 lb 4.8 oz (51.393 kg) 113 lb 12.8 oz (51.619 kg)    Intake/Output:   Intake/Output Summary (Last 24 hours) at 09/02/2013 1439 Last data filed at 08/10/2013 1317  Gross per 24 hour  Intake     593 ml  Output   1680 ml  Net  -1087 ml     Physical Exam: General: Elderly appearing. No resp difficulty; sitting in recliner  HEENT: normal  Neck: supple. JVP 6-7 with prominent CV waves . Carotids 2+ bilat; no bruits. No lymphadenopathy or thryomegaly appreciated. R upper chest dialysis catheter.  Cor: PMI nondisplaced. Regular rate & rhythm. No rubs, gallops or murmurs.  Lungs: clear  Abdomen: soft, nontender,. No hepatosplenomegaly. No bruits or masses. Good bowel sounds.  Extremities: no cyanosis, clubbing, rash, no edema. Neuro: alert & orientedx3, cranial nerves grossly intact. moves all 4 extremities w/o difficulty. Affect pleasant  Telemetry: SR 90s    Labs: Basic Metabolic Panel:  Recent Labs Lab 08/16/13 1555 08/17/13 0310 08/17/13 1630 08/18/13 0347 08/29/2013 0712  NA 138 134* 136* 134* 132*  K 4.3 4.2 4.7 4.3 4.5  CL 89* 89* 89* 89* 86*  CO2 29 25 28 26 28   GLUCOSE 88 164* 174* 188* 184*  BUN 92* 93* 98* 110* 127*  CREATININE 5.57* 5.46* 6.50* 6.53* 6.70*  CALCIUM 10.3 9.4 9.6 9.2 10.0  PHOS 6.6* 7.0* 7.1* 7.0* 6.9*    Liver Function Tests:  Recent Labs Lab 08/16/13 1555 08/17/13 0310 08/17/13 1630 08/18/13 0347 09/04/2013 0712  ALBUMIN 3.1* 3.0* 3.1* 3.0* 3.4*   No results found for this basename: LIPASE, AMYLASE,  in the  last 168 hours No results found for this basename: AMMONIA,  in the last 168 hours  CBC:  Recent Labs Lab 08/15/13 0411  WBC 10.4  HGB 9.5*  HCT 28.9*  MCV 82.1  PLT 240    Cardiac Enzymes: No results found for this basename: CKTOTAL, CKMB, CKMBINDEX, TROPONINI,  in the last 168 hours  BNP: BNP (last 3 results)  Recent Labs  08/28/2013 1609  PROBNP 22793.0*     Other results:  Imaging: Dg Chest Port 1 View  08/18/2013   CLINICAL DATA:  Post back at catheter insertion  EXAM: PORTABLE CHEST - 1 VIEW  COMPARISON:  Portable exam 1154 hr compared to 08/11/2013  FINDINGS: Right jugular dual-lumen central venous  catheter with distal tip projecting over cavoatrial junction.  Upper normal heart size post CABG.  Mediastinal contours and pulmonary vascularity normal.  Resolution of previously identified bibasilar effusions and atelectasis.  Lungs grossly clear.  No pneumothorax.  IMPRESSION: No pneumothorax following central line insertion.  Distal tip of catheter projects over cavoatrial junction.  Mild bronchitic changes.   Electronically Signed   By: Lavonia Dana M.D.   On: 09/03/2013 12:22     Medications:     Scheduled Medications: . antiseptic oral rinse  15 mL Mouth Rinse BID  . aspirin  81 mg Oral Daily  . calcitRIOL  0.25 mcg Oral Once per day on Mon Wed Fri  . calcium acetate  1,334 mg Oral TID WC  . carvedilol  3.125 mg Oral BID WC  . citalopram  10 mg Oral Daily  . darbepoetin (ARANESP) injection - NON-DIALYSIS  60 mcg Subcutaneous Q Wed-1800  . ferric gluconate (FERRLECIT/NULECIT) IV  125 mg Intravenous Daily  . glimepiride  0.5 mg Oral Q breakfast  . heparin  5,000 Units Subcutaneous 3 times per day  . insulin aspart  0-9 Units Subcutaneous TID WC  . isosorbide-hydrALAZINE  1 tablet Oral TID  . loratadine  10 mg Oral Daily  . simvastatin  10 mg Oral q1800    Infusions: . sodium chloride 10 mL/hr (08/20/2013 0959)    PRN Medications: fluticasone, hydrALAZINE, HYDROcodone-acetaminophen, nitroGLYCERIN, ondansetron (ZOFRAN) IV, ondansetron, oxyCODONE-acetaminophen, polyethylene glycol, traMADol   Assessment:   1) Acute combined systolic/diastolic HF  - EF 76-73%, grade III DD, RV mildly dilated and sys fx mod reduced  2) Cardiorenal syndrome  3) DM  4) HTN  5) Acute/Chronic kidney failure, stage IV 2.8->3.9> 5.1  6) CAD s/p CABG 2010  Plan/Discussion:   S/P dialysis catheter placement today  Plan RHC/LHC tomorrow at 9:00 to assess coronaries and hemodynamics. Continue low dose carvedilol and hydralazine/imdur. No Ace due to CKD.   Length of Stay: 10 CLEGG,AMY NP-C   08/24/2013, 2:39 PM  Advanced Heart Failure Team Pager [phone] (M-F; 7a - 4p)  Please contact [facility] for night-coverage after hours (4p -7a ) and weekends on [email or web address]  Patient seen and examined with Darrick Grinder, NP. We discussed all aspects of the encounter. I agree with the assessment and plan as stated above.   Patient declared ESRD today and committed to HD. Will plan [facility] tomorrow to assess coronaries/grafts and hemodynamics given recent decrease in LV function. Procedure explained. Agrees to proceed.   Leven Hoel,MD 3:02 PM

## 2013-08-19 NOTE — Plan of Care (Signed)
Problem: Consults Goal: Cardiac Cath Patient Education (See Patient Education module for education specifics.) Outcome: Progressing Pt oriented about procedure, but is refusing to watch cath video at this time, secondary to pain and discomfort after dialysis.

## 2013-08-19 NOTE — Progress Notes (Signed)
Veronica Copeland is a 68 yo F presenting preoperatively for HD catheter insertion and AV fistula creation. Upon evaluation patient is in acute on chronic heart failure with EF 20% with SOB and volume overload. [name] renal function has diminished and is now in need of hemodialysis. Given the interval change in cardiac function, Cardiology would like to catheterize the patient. This evaluation has been delayed until the patient has been dialyzed. Upon discussing the case with the patient, her family, and Dr Scot Dock, the decision was made to place a HD catheter under MAC and allow hemodialysis and cardiac evaluation with medical optimization prior to AV fistula creation.  Oleta Mouse, MD August 27, 2013 10:44 AM

## 2013-08-19 NOTE — Progress Notes (Signed)
[facility] PROGRESS NOTE  Filed Weights   08/17/13 0634 08/18/13 0602 08/26/2013 6314  Weight: 51.2 kg (112 lb 14 oz) 51.393 kg (113 lb 4.8 oz) 51.619 kg (113 lb 12.8 oz)        Intake/Output Summary (Last 24 hours) at 08/30/2013 1650 Last data filed at 08/13/2013 1317  Gross per 24 hour  Intake    373 ml  Output   1380 ml  Net  -1007 ml    Assessment/Plan: Acute respiratory failure due to  Acute combined systolic and diastolic congestive heart failure and right sided PNA: - Started on HD today (2/12) -status post diatek cath  - no SOB, JVD or crackles on exam. - Cont metoprolol. Finish course of antibiotics on 2.7.2014. - Appriciate advance heart failure team and nephrology assitance. -plan is for [facility] tomorrow to assess coronaries/grafts and hemodynamics given recent decrease in LV function  Cardiorenal syndrome CKD V: -renal on board -patient borderline uremic, with SCr 6.70, BUN 127 and Phosphorus of 7.0 -started on HD today -Will follow recommendations.   Type II or unspecified type diabetes mellitus without mention of complication, uncontrolled - HbgA1c 7.4, cont SSI and glipizide. - stable  HYPERTENSION - BP stable. - no further changes to her medication regimens at this point.   Physical decondition: -will need SNF at discharge    Code Status: Full  Family Communication: Pt at bedside. Disposition Plan: SNF for rehab; once HD/clipping process initiaded   Consultants:  Advance heart failure  renal  Procedures: ECHO 9.7.0263: Systolic function was severely reduced. The estimated ejection fraction was in the range of 20% to 25%. Doppler parameters are consistent with a restrictive pattern, indicative of decreased left ventricular diastolic compliance and/or increased left atrial pressure (grade 3 diastolic dysfunction). Doppler parameters are consistent with both elevated ventricular end-diastolic filling pressure and elevated left atrial filling  pressure.   right and left heart catheter (2/13)  Antibiotics:  None  HPI/Subjective: No CP, no SOB. S/p diatek cath placement  Objective: Filed Vitals:   08/13/2013 1221 08/26/2013 1230 08/18/2013 1422 08/26/2013 1630  BP: 126/51  137/69 151/76  Pulse: 87 88 93 91  Temp: 97.6 F (36.4 C) 98.5 F (36.9 C) 98.1 F (36.7 C)   TempSrc:   Oral   Resp: 16 20 20 18   Height:      Weight:      SpO2: 100% 100% 100%      Exam: General: Alert, awake, oriented x3, in no acute distress.  HEENT: No bruits, no goiter. No JVD Heart: Regular rate and rhythm, without murmurs, rubs, gallops.  Lungs: Good air movement, clear to auscultation. Abdomen: Soft, nontender, nondistended, positive bowel sounds.   Data Reviewed: Basic Metabolic Panel:  Recent Labs Lab 08/16/13 1555 08/17/13 0310 08/17/13 1630 08/18/13 0347 08/29/2013 0712  NA 138 134* 136* 134* 132*  K 4.3 4.2 4.7 4.3 4.5  CL 89* 89* 89* 89* 86*  CO2 29 25 28 26 28   GLUCOSE 88 164* 174* 188* 184*  BUN 92* 93* 98* 110* 127*  CREATININE 5.57* 5.46* 6.50* 6.53* 6.70*  CALCIUM 10.3 9.4 9.6 9.2 10.0  PHOS 6.6* 7.0* 7.1* 7.0* 6.9*   Liver Function Tests:  Recent Labs Lab 08/16/13 1555 08/17/13 0310 08/17/13 1630 08/18/13 0347 08/13/2013 0712  ALBUMIN 3.1* 3.0* 3.1* 3.0* 3.4*   CBC:  Recent Labs Lab 08/15/13 0411  WBC 10.4  HGB 9.5*  HCT 28.9*  MCV 82.1  PLT 240   BNP (last 3  results)  Recent Labs  08/22/2013 1609  PROBNP 22793.0*   CBG:  Recent Labs Lab 08/18/13 1632 08/18/13 2105 08/09/2013 0616 08/28/2013 0938 08/18/2013 1151  GLUCAP 169* 137* 209* 74 109*    Recent Results (from the past 240 hour(s))  CULTURE, BLOOD (ROUTINE X 2)     Status: None   Collection Time    08/31/2013 10:10 PM      Result Value Ref Range Status   Specimen Description BLOOD RIGHT ARM   Final   Special Requests BOTTLES DRAWN AEROBIC AND ANAEROBIC 5CC   Final   Culture  Setup Time     Final   Value: 08/10/2013 03:19      Performed at [facility]   Culture     Final   Value: NO GROWTH 5 DAYS     Performed at [facility]   Report Status 08/16/2013 FINAL   Final  CULTURE, BLOOD (ROUTINE X 2)     Status: None   Collection Time    08/20/2013 10:15 PM      Result Value Ref Range Status   Specimen Description BLOOD LEFT ARM   Final   Special Requests BOTTLES DRAWN AEROBIC ONLY 10CC   Final   Culture  Setup Time     Final   Value: 08/10/2013 03:19     Performed at [facility]   Culture     Final   Value: NO GROWTH 5 DAYS     Performed at [facility]   Report Status 08/16/2013 FINAL   Final  URINE CULTURE     Status: None   Collection Time    08/13/13 12:26 AM      Result Value Ref Range Status   Specimen Description URINE, RANDOM   Final   Special Requests NONE   Final   Culture  Setup Time     Final   Value: 08/13/2013 08:48     Performed at [place]     Final   Value: NO GROWTH     Performed at [facility]   Culture     Final   Value: NO GROWTH     Performed at [facility]   Report Status 08/14/2013 FINAL   Final     Studies: Dg Chest Port 1 View  08/20/2013   CLINICAL DATA:  Post back at catheter insertion  EXAM: PORTABLE CHEST - 1 VIEW  COMPARISON:  Portable exam 1154 hr compared to 08/11/2013  FINDINGS: Right jugular dual-lumen central venous catheter with distal tip projecting over cavoatrial junction.  Upper normal heart size post CABG.  Mediastinal contours and pulmonary vascularity normal.  Resolution of previously identified bibasilar effusions and atelectasis.  Lungs grossly clear.  No pneumothorax.  IMPRESSION: No pneumothorax following central line insertion.  Distal tip of catheter projects over cavoatrial junction.  Mild bronchitic changes.   Electronically Signed   By: Lavonia Dana M.D.   On: 08/31/2013 12:22    Scheduled Meds: . antiseptic oral rinse  15 mL Mouth Rinse BID  . aspirin  81 mg Oral Daily  .  calcitRIOL  0.25 mcg Oral Once per day on Mon Wed Fri  . calcium acetate  1,334 mg Oral TID WC  . carvedilol  3.125 mg Oral BID WC  . citalopram  10 mg Oral Daily  . darbepoetin (ARANESP) injection - NON-DIALYSIS  60 mcg Subcutaneous Q Wed-1800  . ferric gluconate (FERRLECIT/NULECIT) IV  125 mg Intravenous Daily  . glimepiride  0.5 mg Oral Q breakfast  . heparin  5,000 Units Subcutaneous 3 times per day  . insulin aspart  0-9 Units Subcutaneous TID WC  . isosorbide-hydrALAZINE  1 tablet Oral TID  . loratadine  10 mg Oral Daily  . simvastatin  10 mg Oral q1800   Continuous Infusions: . sodium chloride 10 mL/hr (08/12/2013 0959)   Time < 30 minutes  Jahrell Hamor  [facility] Pager [phone] If 8PM-8AM, please contact night-coverage at [email or web address], password [facility] 08/26/2013, 4:50 PM  LOS: 10 days

## 2013-08-19 NOTE — Progress Notes (Signed)
Patient alert and oriented x4.  Off unit for most of shift for dialysis catheter placement in right chest and first hemodialysis session.  Vital signs stable after procedure.  Patient experiencing pain in right chest at incision site after surgery; PRN given to good effect.  Patient not back to unit from hemodialysis as of shift change.  Patient's son concerned about plan for patient's cardiac cath procedure tomorrow, states he "didn't think it would be so soon after surgery".  Called PA to share patient's son's concerns with medical team.  MD aware and spoke with son over the phone.

## 2013-08-20 ENCOUNTER — Encounter (HOSPITAL_COMMUNITY): Payer: Self-pay | Admitting: Vascular Surgery

## 2013-08-20 ENCOUNTER — Encounter (HOSPITAL_COMMUNITY): Admission: EM | Disposition: E | Payer: Self-pay | Source: Home / Self Care | Attending: Internal Medicine

## 2013-08-20 DIAGNOSIS — I2510 Atherosclerotic heart disease of native coronary artery without angina pectoris: Secondary | ICD-10-CM

## 2013-08-20 HISTORY — PX: LEFT AND RIGHT HEART CATHETERIZATION WITH CORONARY ANGIOGRAM: SHX5449

## 2013-08-20 LAB — CBC
HCT: 31.3 % — ABNORMAL LOW (ref 36.0–46.0)
Hemoglobin: 9.7 g/dL — ABNORMAL LOW (ref 12.0–15.0)
MCH: 26.8 pg (ref 26.0–34.0)
MCHC: 31 g/dL (ref 30.0–36.0)
MCV: 86.5 fL (ref 78.0–100.0)
Platelets: 226 10*3/uL (ref 150–400)
RBC: 3.62 MIL/uL — AB (ref 3.87–5.11)
RDW: 15.6 % — ABNORMAL HIGH (ref 11.5–15.5)
WBC: 8.8 10*3/uL (ref 4.0–10.5)

## 2013-08-20 LAB — GLUCOSE, CAPILLARY
GLUCOSE-CAPILLARY: 97 mg/dL (ref 70–99)
GLUCOSE-CAPILLARY: 96 mg/dL (ref 70–99)
Glucose-Capillary: 112 mg/dL — ABNORMAL HIGH (ref 70–99)
Glucose-Capillary: 81 mg/dL (ref 70–99)
Glucose-Capillary: 108 mg/dL — ABNORMAL HIGH (ref 70–99)

## 2013-08-20 LAB — CREATININE, SERUM
CREATININE: 5.14 mg/dL — AB (ref 0.50–1.10)
GFR calc Af Amer: 9 mL/min — ABNORMAL LOW (ref >90)
GFR calc non Af Amer: 8 mL/min — ABNORMAL LOW (ref >90)

## 2013-08-20 LAB — RENAL FUNCTION PANEL
ALBUMIN: 3.2 g/dL — AB (ref 3.5–5.2)
BUN: 74 mg/dL — AB (ref 6–23)
CO2: 29 mEq/L (ref 19–32)
Calcium: 9 mg/dL (ref 8.4–10.5)
Chloride: 94 mEq/L — ABNORMAL LOW (ref 96–112)
Creatinine, Ser: 4.84 mg/dL — ABNORMAL HIGH (ref 0.50–1.10)
GFR calc Af Amer: 10 mL/min — ABNORMAL LOW (ref >90)
GFR calc non Af Amer: 8 mL/min — ABNORMAL LOW (ref >90)
Glucose, Bld: 134 mg/dL — ABNORMAL HIGH (ref 70–99)
PHOSPHORUS: 5.6 mg/dL — AB (ref 2.3–4.6)
POTASSIUM: 4.7 meq/L (ref 3.7–5.3)
Sodium: 137 mEq/L (ref 137–147)

## 2013-08-20 LAB — POCT I-STAT 3, ART BLOOD GAS (G3+)
Acid-Base Excess: 3 mmol/L — ABNORMAL HIGH (ref 0.0–2.0)
BICARBONATE: 28.6 meq/L — AB (ref 20.0–24.0)
O2 Saturation: 100 %
PH ART: 7.371 (ref 7.350–7.450)
PO2 ART: 181 mmHg — AB (ref 80.0–100.0)
TCO2: 30 mmol/L (ref 0–100)
pCO2 arterial: 49.4 mmHg — ABNORMAL HIGH (ref 35.0–45.0)

## 2013-08-20 LAB — POCT I-STAT 3, VENOUS BLOOD GAS (G3P V)
BICARBONATE: 26.9 meq/L — AB (ref 20.0–24.0)
Bicarbonate: 27.1 mEq/L — ABNORMAL HIGH (ref 20.0–24.0)
O2 SAT: 60 %
O2 Saturation: 60 %
PCO2 VEN: 53 mmHg — AB (ref 45.0–50.0)
PH VEN: 7.312 — AB (ref 7.250–7.300)
PH VEN: 7.313 — AB (ref 7.250–7.300)
TCO2: 29 mmol/L (ref 0–100)
TCO2: 28 mmol/L (ref 0–100)
pCO2, Ven: 53.7 mmHg — ABNORMAL HIGH (ref 45.0–50.0)
pO2, Ven: 35 mmHg (ref 30.0–45.0)
pO2, Ven: 35 mmHg (ref 30.0–45.0)

## 2013-08-20 LAB — PROTIME-INR
INR: 0.98 (ref 0.00–1.49)
Prothrombin Time: 12.8 seconds (ref 11.6–15.2)

## 2013-08-20 SURGERY — LEFT AND RIGHT HEART CATHETERIZATION WITH CORONARY ANGIOGRAM
Anesthesia: LOCAL

## 2013-08-20 MED ORDER — HEPARIN (PORCINE) IN NACL 2-0.9 UNIT/ML-% IJ SOLN
INTRAMUSCULAR | Status: AC
  Filled 2013-08-20: qty 1500

## 2013-08-20 MED ORDER — SODIUM CHLORIDE 0.9 % IJ SOLN
3.0000 mL | INTRAMUSCULAR | Status: DC | PRN
Start: 2013-08-20 — End: 2013-08-25

## 2013-08-20 MED ORDER — SODIUM CHLORIDE 0.9 % IJ SOLN
3.0000 mL | Freq: Two times a day (BID) | INTRAMUSCULAR | Status: DC
  Administered 2013-08-20 – 2013-08-24 (×8): 3 mL via INTRAVENOUS

## 2013-08-20 MED ORDER — MIDAZOLAM HCL 2 MG/2ML IJ SOLN
INTRAMUSCULAR | Status: AC
  Filled 2013-08-20: qty 2

## 2013-08-20 MED ORDER — LIDOCAINE HCL (PF) 1 % IJ SOLN
INTRAMUSCULAR | Status: AC
  Filled 2013-08-20: qty 30

## 2013-08-20 MED ORDER — SODIUM CHLORIDE 0.9 % IV SOLN
250.0000 mL | INTRAVENOUS | Status: DC | PRN
Start: 2013-08-20 — End: 2013-08-25

## 2013-08-20 MED ORDER — HEPARIN SODIUM (PORCINE) 5000 UNIT/ML IJ SOLN: 5000.0000 [IU] | Freq: Three times a day (TID) | INTRAMUSCULAR | Status: DC

## 2013-08-20 MED ORDER — ISOSORBIDE MONONITRATE ER 30 MG PO TB24
30.0000 mg | ORAL_TABLET | Freq: Every day | ORAL | Status: DC
  Administered 2013-08-20 – 2013-08-22 (×3): 30 mg via ORAL
  Filled 2013-08-20 (×3): qty 1

## 2013-08-20 NOTE — Progress Notes (Signed)
VASCULAR SURGERY  She had a right IJ tunneled dialysis catheter placed yesterday. Postoperative chest x-ray shows the catheter in excellent position with no kinks. Catheter reportedly ran poorly yesterday so activase was place in catheter for the night. I suspect it will work better at next treatment as it appears to be in excellent position and withdrew and flushed easily in the OR.   Renal would like [place] to proceed with AVF next week. Will try to schedule L AVF for Tuesday. I spoke with Dr. Haroldine Laws who thinks that this would be reasonable although clearly associated with some increased risk.  Veronica Mayo, MD, FACS Beeper [phone] 08/21/2013

## 2013-08-20 NOTE — H&P (View-Only) (Signed)
Advanced Heart Failure Rounding Note   Subjective:    Veronica Copeland is a 68 y.o. female hx of CAD s/p CABG (2010), CKD IV (baseline Cr 2.8/GFR 15-20), DVT not on coumadin, DM, HTN and newly diagnosed systolic HF. EF 2011 60-65%   She has been doing fairly well and reports that she was able to get around well with a walker and perfrom all her ADLs until last Thursday after she had her pneummonia vaccine. She reports that following vaccine that over the next couple of days she noticed that she had SOB and orthopnea. Denies any CP (no CP with prior MI). Lives alone. She reports she follows closely with Dr. Jenny Reichmann and takes her medications as prescribed. Reports that she follows with renal closely and they discussed starting HD or renal transplant however it was placed on hold because she had mass found in R breast and they were concerned cancer, however CT showed incidental mass.   Pertinent labs on admission were pro-BNP 22,000, Cr 2.9, HIV negative, TSH 1.11 SBPs 120-150   Echo 08/10/13:  LVEF 20-25% with regional wall motion abnormalities. Moderate RV dysfunction. +restrictive physiology   Started on milrinone with no improvement. Milrinone stopped due to worsening renal function.   Underwent HD for first time yesterday. Tolerated Ok.   Denies SOB    Objective:   Weight Range:  Vital Signs:   Temp:  [97.5 F (36.4 C)-98.5 F (36.9 C)] 98.4 F (36.9 C) (02/13 0508) Pulse Rate:  [84-104] 84 (02/13 1017) Resp:  [16-22] 19 (02/13 0508) BP: (110-175)/(51-86) 132/62 mmHg (02/13 1017) SpO2:  [99 %-100 %] 100 % (02/13 1017) Weight:  [51.2 kg (112 lb 14 oz)-51.6 kg (113 lb 12.1 oz)] 51.2 kg (112 lb 14 oz) (02/13 0508) Last BM Date: 08/16/13  Weight change: Filed Weights   09/01/2013 1611 08/18/2013 1826 08/30/2013 0508  Weight: 51.3 kg (113 lb 1.5 oz) 51.6 kg (113 lb 12.1 oz) 51.2 kg (112 lb 14 oz)    Intake/Output:   Intake/Output Summary (Last 24 hours) at 08/26/2013 1023 Last data filed at  08/23/2013 0900  Gross per 24 hour  Intake    193 ml  Output    696 ml  Net   -503 ml     Physical Exam: General: Elderly appearing. No resp difficulty; sitting in recliner  HEENT: normal  Neck: supple. JVP 6-7 with prominent CV waves . Carotids 2+ bilat; no bruits. No lymphadenopathy or thryomegaly appreciated. R upper chest dialysis catheter.  Cor: PMI nondisplaced. Regular rate & rhythm. No rubs, gallops or murmurs.  Lungs: clear  Abdomen: soft, nontender,. No hepatosplenomegaly. No bruits or masses. Good bowel sounds.  Extremities: no cyanosis, clubbing, rash, no edema. Neuro: alert & orientedx3, cranial nerves grossly intact. moves all 4 extremities w/o difficulty. Affect pleasant  Telemetry: SR 90s    Labs: Basic Metabolic Panel:  Recent Labs Lab 08/17/13 0310 08/17/13 1630 08/18/13 0347 08/26/2013 0712 08/15/2013 0435  NA 134* 136* 134* 132* 137  K 4.2 4.7 4.3 4.5 4.7  CL 89* 89* 89* 86* 94*  CO2 25 28 26 28 29   GLUCOSE 164* 174* 188* 184* 134*  BUN 93* 98* 110* 127* 74*  CREATININE 5.46* 6.50* 6.53* 6.70* 4.84*  CALCIUM 9.4 9.6 9.2 10.0 9.0  PHOS 7.0* 7.1* 7.0* 6.9* 5.6*    Liver Function Tests:  Recent Labs Lab 08/17/13 0310 08/17/13 1630 08/18/13 0347 08/14/2013 0712 09/04/2013 0435  ALBUMIN 3.0* 3.1* 3.0* 3.4* 3.2*   No results  found for this basename: LIPASE, AMYLASE,  in the last 168 hours No results found for this basename: AMMONIA,  in the last 168 hours  CBC:  Recent Labs Lab 08/15/13 0411  WBC 10.4  HGB 9.5*  HCT 28.9*  MCV 82.1  PLT 240    Cardiac Enzymes: No results found for this basename: CKTOTAL, CKMB, CKMBINDEX, TROPONINI,  in the last 168 hours  BNP: BNP (last 3 results)  Recent Labs  09/01/2013 1609  PROBNP 22793.0*     Other results:  Imaging: Dg Chest Port 1 View  08/30/2013   CLINICAL DATA:  Post back at catheter insertion  EXAM: PORTABLE CHEST - 1 VIEW  COMPARISON:  Portable exam 1154 hr compared to 08/11/2013   FINDINGS: Right jugular dual-lumen central venous catheter with distal tip projecting over cavoatrial junction.  Upper normal heart size post CABG.  Mediastinal contours and pulmonary vascularity normal.  Resolution of previously identified bibasilar effusions and atelectasis.  Lungs grossly clear.  No pneumothorax.  IMPRESSION: No pneumothorax following central line insertion.  Distal tip of catheter projects over cavoatrial junction.  Mild bronchitic changes.   Electronically Signed   By: Lavonia Dana M.D.   On: 08/12/2013 12:22   Dg Fluoro Guide Cv Line-no Report  08/08/2013   CLINICAL DATA: dialysis catheter   FLOURO GUIDE CV LINE  Fluoroscopy was utilized by the requesting physician.  No radiographic  interpretation.      Medications:     Scheduled Medications: . alteplase (TPA-ACTIVASE) *DIALYSIS CATH* 5 mg infusion  5 mg Intravenous Once  . antiseptic oral rinse  15 mL Mouth Rinse BID  . aspirin  81 mg Oral Daily  . calcitRIOL  0.25 mcg Oral Once per day on Mon Wed Fri  . calcium acetate  1,334 mg Oral TID WC  . carvedilol  3.125 mg Oral BID WC  . citalopram  10 mg Oral Daily  . darbepoetin (ARANESP) injection - NON-DIALYSIS  60 mcg Subcutaneous Q Wed-1800  . glimepiride  0.5 mg Oral Q breakfast  . heparin  5,000 Units Subcutaneous 3 times per day  . insulin aspart  0-9 Units Subcutaneous TID WC  . isosorbide-hydrALAZINE  1 tablet Oral TID  . loratadine  10 mg Oral Daily  . simvastatin  10 mg Oral q1800  . sodium chloride  3 mL Intravenous Q12H    Infusions: . sodium chloride 10 mL/hr (08/11/2013 0959)  . sodium chloride 10 mL/hr (08/19/2013 0612)    PRN Medications: sodium chloride, fluticasone, hydrALAZINE, HYDROcodone-acetaminophen, nitroGLYCERIN, ondansetron (ZOFRAN) IV, ondansetron, oxyCODONE-acetaminophen, polyethylene glycol, sodium chloride, traMADol   Assessment:   1) Acute combined systolic/diastolic HF  - EF 43-15%, grade III DD, RV mildly dilated and sys fx mod  reduced  2) Cardiorenal syndrome  3) DM  4) HTN  5) Acute/Chronic kidney failure, stage IV 2.8->3.9> 5.1  6) CAD s/p CABG 2010  Plan/Discussion:    She is now on HD. Wants to proceed with cath today.   Plan RHC/LHC later today to assess coronaries and hemodynamics. Continue low dose carvedilol and add back hydralazine/imdur as BP tolerates (if ok with Renal).  No Ace due to CKD.   Length of Stay: 25 Glori Bickers MD 08/28/2013, 10:23 AM  Advanced Heart Failure Team Pager [phone] (M-F; 7a - 4p)

## 2013-08-20 NOTE — Progress Notes (Signed)
Admit: 09/02/2013 LOS: 80  89F AoCKD in setting of biventricular S/D HF, volume overload with SOB, orthopnea and cough.  Renal failure has progressed during hospitalization-   Subjective: Events noted- had PC placed yest followed by HD.  Plan is or heart cath today. Fistula placement was deferred since is bigger surgery with bigger risk, wanting cardiac situation to be maximized before proceeding.  She reports pain with PC placement and poorly running catheter with treatment.     02/12 0701 - 02/13 0700 In: 193 [P.O.:60; I.V.:133] Out: 896 [Urine:950; Blood:5]  Filed Weights   08/18/2013 1611 08/26/2013 1826 09/04/2013 0508  Weight: 51.3 kg (113 lb 1.5 oz) 51.6 kg (113 lb 12.1 oz) 51.2 kg (112 lb 14 oz)    Current meds: reviewed  Current Labs: reviewed    Physical Exam:  Blood pressure 132/62, pulse 84, temperature 98.4 F (36.9 C), temperature source Oral, resp. rate 19, height 5' 3.75" (1.619 m), weight 51.2 kg (112 lb 14 oz), SpO2 100.00%. NAD, sitting in chair, eating lunch RRR, no s3 or s4 Faint bibasilar crackles, L>R, nl wob No LEE Right sided PC    Assessment/Plan  89F AoCKD in setting of biventricular S/D HF, volume overload with SOB, orthopnea and cough.  Renal failure has progressed during hospitalization- 1. Renal- AoCKD (BL SCr 2s; but was 3.1 07/2012 at [place]) 2/2 cardiorenal syndrome.  Definitely now shown progression.  It was previously difficult to know what exactly to do but now she is declaring herself.  Will go ahead and declare her ESRD- got PC yesterday  (appreciate [facility] prompt service) followed by her first HD treatment- since cardiac cath is planned for today, i will do second tomorrow (saturday) Will also initiate search for OP dialysis arrangements. Fistula placement hopefully soon.  PD will need to be down the road after she is hopefully more tuned.  2. Cardiac/Biventricular S/D CHF, acute on chronic; LVEF 20-25% + DD - has experienced drop in EF since  last eval'd- cards is wanting to do cath and now that she has been declared ESRD seems like a good time to do- has been planned for today  3. Anemia- TSAT 18%; likely IDA + Anemia of CKD- s/p iron and on ESA 4. 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 HTN, stable- coreg and bidil    Tavis Kring A  08/12/2013, 10:32 AM   Recent Labs Lab 08/18/13 0347 08/15/2013 0712 08/10/2013 0435  NA 134* 132* 137  K 4.3 4.5 4.7  CL 89* 86* 94*  CO2 26 28 29   GLUCOSE 188* 184* 134*  BUN 110* 127* 74*  CREATININE 6.53* 6.70* 4.84*  CALCIUM 9.2 10.0 9.0  PHOS 7.0* 6.9* 5.6*    Recent Labs Lab 08/15/13 0411  WBC 10.4  HGB 9.5*  HCT 28.9*  MCV 82.1  PLT 240

## 2013-08-20 NOTE — Progress Notes (Signed)
PT Cancellation Note  Pt not available, she is at a procedure or test.  Will follow.   Veronica Copeland PT 08/23/2013  [phone]

## 2013-08-20 NOTE — Progress Notes (Signed)
[facility] PROGRESS NOTE  Filed Weights   08-28-2013 1611 08/28/2013 1826 08/16/2013 0508  Weight: 51.3 kg (113 lb 1.5 oz) 51.6 kg (113 lb 12.1 oz) 51.2 kg (112 lb 14 oz)        Intake/Output Summary (Last 24 hours) at 08/24/2013 1856 Last data filed at 08/24/2013 1700  Gross per 24 hour  Intake    423 ml  Output    300 ml  Net    123 ml    Assessment/Plan: Acute respiratory failure due to  Acute combined systolic and diastolic congestive heart failure and right sided PNA: - Started on HD  -status post diatek cath revision; will follow renal service for further HD treatemnts - no SOB, JVD or crackles on exam. -Cont metoprolol.  - Finish course of antibiotics on 2.7.2014. - Appriciate advance heart failure team and nephrology assitance. -s/p [facility] cath (see results below); plan is for medical management for now. Will add imdur 1. Severe 3V CAD  2. LIMA patent  3. SVG to RCA - occluded  4. Normal right heart cath  5. Ischemic CM with EF 40% by cath. 20-25% by echo  Cardiorenal syndrome CKD V: -renal on board -patient borderline uremic, with SCr 6.70, BUN 127 and Phosphorus of 7.0 -started on HD today -Will follow recommendations.  Type II or unspecified type diabetes mellitus without mention of complication, uncontrolled - HbgA1c 7.4, cont SSI and glipizide. - stable  HYPERTENSION - BP stable. -stable. Will monitor -imdur added today  Physical decondition: -will need SNF at discharge    Code Status: Full  Family Communication: Pt at bedside. Disposition Plan: SNF for rehab; once HD/clipping process initiaded   Consultants:  Advance heart failure  renal  Procedures: ECHO 1.9.1478: Systolic function was severely reduced. The estimated ejection fraction was in the range of 20% to 25%. Doppler parameters are consistent with a restrictive pattern, indicative of decreased left ventricular diastolic compliance and/or increased left atrial pressure (grade 3  diastolic dysfunction). Doppler parameters are consistent with both elevated ventricular end-diastolic filling pressure and elevated left atrial filling pressure.   right and left heart catheter (2/13)  Antibiotics:  None  HPI/Subjective: No CP, no SOB. S/p heart cath; feeling ok.  Objective: Filed Vitals:   08/13/2013 1435 08/18/2013 1505 08/19/2013 1549 08/30/2013 1711  BP: 115/57 119/61 148/41 161/44  Pulse: 85 87 93 88  Temp:   98.2 F (36.8 C)   TempSrc:   Oral   Resp: 18  16   Height:      Weight:      SpO2: 100% 100% 100% 100%     Exam: General: Alert, awake, oriented x3, in no acute distress.  HEENT: No bruits, no goiter. No JVD Heart: Regular rate and rhythm, without murmurs, rubs, gallops.  Lungs: Good air movement, clear to auscultation. Abdomen: Soft, nontender, nondistended, positive bowel sounds.   Data Reviewed: Basic Metabolic Panel:  Recent Labs Lab 08/17/13 0310 08/17/13 1630 08/18/13 0347 Aug 28, 2013 0712 08/14/2013 0435 08/26/2013 1540  NA 134* 136* 134* 132* 137  --   K 4.2 4.7 4.3 4.5 4.7  --   CL 89* 89* 89* 86* 94*  --   CO2 25 28 26 28 29   --   GLUCOSE 164* 174* 188* 184* 134*  --   BUN 93* 98* 110* 127* 74*  --   CREATININE 5.46* 6.50* 6.53* 6.70* 4.84* 5.14*  CALCIUM 9.4 9.6 9.2 10.0 9.0  --   PHOS 7.0* 7.1* 7.0* 6.9*  5.6*  --    Liver Function Tests:  Recent Labs Lab 08/17/13 0310 08/17/13 1630 08/18/13 0347 08/30/2013 0712 08/31/2013 0435  ALBUMIN 3.0* 3.1* 3.0* 3.4* 3.2*   CBC:  Recent Labs Lab 08/15/13 0411 08/28/2013 1540  WBC 10.4 8.8  HGB 9.5* 9.7*  HCT 28.9* 31.3*  MCV 82.1 86.5  PLT 240 226   BNP (last 3 results)  Recent Labs  08/08/2013 1609  PROBNP 22793.0*   CBG:  Recent Labs Lab 09/01/2013 1958 08/28/2013 0551 08/29/2013 1259 08/26/2013 1355 08/18/2013 1640  GLUCAP 134* 112* 97 96 81    Recent Results (from the past 240 hour(s))  URINE CULTURE     Status: None   Collection Time    08/13/13 12:26 AM      Result  Value Ref Range Status   Specimen Description URINE, RANDOM   Final   Special Requests NONE   Final   Culture  Setup Time     Final   Value: 08/13/2013 08:48     Performed at [place]     Final   Value: NO GROWTH     Performed at [facility]   Culture     Final   Value: NO GROWTH     Performed at [facility]   Report Status 08/14/2013 FINAL   Final     Studies: Dg Chest Port 1 View  09/01/2013   CLINICAL DATA:  Post back at catheter insertion  EXAM: PORTABLE CHEST - 1 VIEW  COMPARISON:  Portable exam 1154 hr compared to 08/11/2013  FINDINGS: Right jugular dual-lumen central venous catheter with distal tip projecting over cavoatrial junction.  Upper normal heart size post CABG.  Mediastinal contours and pulmonary vascularity normal.  Resolution of previously identified bibasilar effusions and atelectasis.  Lungs grossly clear.  No pneumothorax.  IMPRESSION: No pneumothorax following central line insertion.  Distal tip of catheter projects over cavoatrial junction.  Mild bronchitic changes.   Electronically Signed   By: Lavonia Dana M.D.   On: 08/26/2013 12:22   Dg Fluoro Guide Cv Line-no Report  08/22/2013   CLINICAL DATA: dialysis catheter   FLOURO GUIDE CV LINE  Fluoroscopy was utilized by the requesting physician.  No radiographic  interpretation.     Scheduled Meds: . alteplase (TPA-ACTIVASE) *DIALYSIS CATH* 5 mg infusion  5 mg Intravenous Once  . antiseptic oral rinse  15 mL Mouth Rinse BID  . aspirin  81 mg Oral Daily  . calcitRIOL  0.25 mcg Oral Once per day on Mon Wed Fri  . calcium acetate  1,334 mg Oral TID WC  . carvedilol  3.125 mg Oral BID WC  . citalopram  10 mg Oral Daily  . darbepoetin (ARANESP) injection - NON-DIALYSIS  60 mcg Subcutaneous Q Wed-1800  . glimepiride  0.5 mg Oral Q breakfast  . heparin  5,000 Units Subcutaneous 3 times per day  . insulin aspart  0-9 Units Subcutaneous TID WC  . isosorbide mononitrate  30 mg  Oral Daily  . loratadine  10 mg Oral Daily  . simvastatin  10 mg Oral q1800  . sodium chloride  3 mL Intravenous Q12H   Continuous Infusions: . sodium chloride 10 mL/hr (08/30/2013 0959)   Time < 30 minutes  Alysah Carton  [facility] Pager [phone] If 8PM-8AM, please contact night-coverage at [email or web address], password [facility] 08/13/2013, 6:56 PM  LOS: 11 days

## 2013-08-20 NOTE — Progress Notes (Signed)
Advanced Heart Failure Rounding Note   Subjective:    [name] is a 68 y.o. female hx of CAD s/p CABG (2010), CKD IV (baseline Cr 2.8/GFR 15-20), DVT not on coumadin, DM, HTN and newly diagnosed systolic HF. EF 2011 60-65%   She has been doing fairly well and reports that she was able to get around well with a walker and perfrom all her ADLs until last Thursday after she had her pneummonia vaccine. She reports that following vaccine that over the next couple of days she noticed that she had SOB and orthopnea. Denies any CP (no CP with prior MI). Lives alone. She reports she follows closely with Dr. Jenny Reichmann and takes her medications as prescribed. Reports that she follows with renal closely and they discussed starting HD or renal transplant however it was placed on hold because she had mass found in R breast and they were concerned cancer, however CT showed incidental mass.   Pertinent labs on admission were pro-BNP 22,000, Cr 2.9, HIV negative, TSH 1.11 SBPs 120-150   Echo 08/10/13:  LVEF 20-25% with regional wall motion abnormalities. Moderate RV dysfunction. +restrictive physiology   Started on milrinone with no improvement. Milrinone stopped due to worsening renal function.   Underwent HD for first time yesterday. Tolerated Ok.   Denies SOB    Objective:   Weight Range:  Vital Signs:   Temp:  [97.5 F (36.4 C)-98.5 F (36.9 C)] 98.4 F (36.9 C) (02/13 0508) Pulse Rate:  [84-104] 84 (02/13 1017) Resp:  [16-22] 19 (02/13 0508) BP: (110-175)/(51-86) 132/62 mmHg (02/13 1017) SpO2:  [99 %-100 %] 100 % (02/13 1017) Weight:  [51.2 kg (112 lb 14 oz)-51.6 kg (113 lb 12.1 oz)] 51.2 kg (112 lb 14 oz) (02/13 0508) Last BM Date: 08/16/13  Weight change: Filed Weights   09/01/2013 1611 08/18/2013 1826 08/30/2013 0508  Weight: 51.3 kg (113 lb 1.5 oz) 51.6 kg (113 lb 12.1 oz) 51.2 kg (112 lb 14 oz)    Intake/Output:   Intake/Output Summary (Last 24 hours) at 08/26/2013 1023 Last data filed at  08/23/2013 0900  Gross per 24 hour  Intake    193 ml  Output    696 ml  Net   -503 ml     Physical Exam: General: Elderly appearing. No resp difficulty; sitting in recliner  HEENT: normal  Neck: supple. JVP 6-7 with prominent CV waves . Carotids 2+ bilat; no bruits. No lymphadenopathy or thryomegaly appreciated. R upper chest dialysis catheter.  Cor: PMI nondisplaced. Regular rate & rhythm. No rubs, gallops or murmurs.  Lungs: clear  Abdomen: soft, nontender,. No hepatosplenomegaly. No bruits or masses. Good bowel sounds.  Extremities: no cyanosis, clubbing, rash, no edema. Neuro: alert & orientedx3, cranial nerves grossly intact. moves all 4 extremities w/o difficulty. Affect pleasant  Telemetry: SR 90s    Labs: Basic Metabolic Panel:  Recent Labs Lab 08/17/13 0310 08/17/13 1630 08/18/13 0347 08/26/2013 0712 08/15/2013 0435  NA 134* 136* 134* 132* 137  K 4.2 4.7 4.3 4.5 4.7  CL 89* 89* 89* 86* 94*  CO2 25 28 26 28 29   GLUCOSE 164* 174* 188* 184* 134*  BUN 93* 98* 110* 127* 74*  CREATININE 5.46* 6.50* 6.53* 6.70* 4.84*  CALCIUM 9.4 9.6 9.2 10.0 9.0  PHOS 7.0* 7.1* 7.0* 6.9* 5.6*    Liver Function Tests:  Recent Labs Lab 08/17/13 0310 08/17/13 1630 08/18/13 0347 09/02/2013 0712 09/04/2013 0435  ALBUMIN 3.0* 3.1* 3.0* 3.4* 3.2*   No results  found for this basename: LIPASE, AMYLASE,  in the last 168 hours No results found for this basename: AMMONIA,  in the last 168 hours  CBC:  Recent Labs Lab 08/15/13 0411  WBC 10.4  HGB 9.5*  HCT 28.9*  MCV 82.1  PLT 240    Cardiac Enzymes: No results found for this basename: CKTOTAL, CKMB, CKMBINDEX, TROPONINI,  in the last 168 hours  BNP: BNP (last 3 results)  Recent Labs  08/12/2013 1609  PROBNP 22793.0*     Other results:  Imaging: Dg Chest Port 1 View  09/03/2013   CLINICAL DATA:  Post back at catheter insertion  EXAM: PORTABLE CHEST - 1 VIEW  COMPARISON:  Portable exam 1154 hr compared to 08/11/2013   FINDINGS: Right jugular dual-lumen central venous catheter with distal tip projecting over cavoatrial junction.  Upper normal heart size post CABG.  Mediastinal contours and pulmonary vascularity normal.  Resolution of previously identified bibasilar effusions and atelectasis.  Lungs grossly clear.  No pneumothorax.  IMPRESSION: No pneumothorax following central line insertion.  Distal tip of catheter projects over cavoatrial junction.  Mild bronchitic changes.   Electronically Signed   By: Mark  Boles M.D.   On: 08/24/2013 12:22   Dg Fluoro Guide Cv Line-no Report  08/21/2013   CLINICAL DATA: dialysis catheter   FLOURO GUIDE CV LINE  Fluoroscopy was utilized by the requesting physician.  No radiographic  interpretation.      Medications:     Scheduled Medications: . alteplase (TPA-ACTIVASE) *DIALYSIS CATH* 5 mg infusion  5 mg Intravenous Once  . antiseptic oral rinse  15 mL Mouth Rinse BID  . aspirin  81 mg Oral Daily  . calcitRIOL  0.25 mcg Oral Once per day on Mon Wed Fri  . calcium acetate  1,334 mg Oral TID WC  . carvedilol  3.125 mg Oral BID WC  . citalopram  10 mg Oral Daily  . darbepoetin (ARANESP) injection - NON-DIALYSIS  60 mcg Subcutaneous Q Wed-1800  . glimepiride  0.5 mg Oral Q breakfast  . heparin  5,000 Units Subcutaneous 3 times per day  . insulin aspart  0-9 Units Subcutaneous TID WC  . isosorbide-hydrALAZINE  1 tablet Oral TID  . loratadine  10 mg Oral Daily  . simvastatin  10 mg Oral q1800  . sodium chloride  3 mL Intravenous Q12H    Infusions: . sodium chloride 10 mL/hr (08/21/2013 0959)  . sodium chloride 10 mL/hr (08/19/2013 0612)    PRN Medications: sodium chloride, fluticasone, hydrALAZINE, HYDROcodone-acetaminophen, nitroGLYCERIN, ondansetron (ZOFRAN) IV, ondansetron, oxyCODONE-acetaminophen, polyethylene glycol, sodium chloride, traMADol   Assessment:   1) Acute combined systolic/diastolic HF  - EF 20-25%, grade III DD, RV mildly dilated and sys fx mod  reduced  2) Cardiorenal syndrome  3) DM  4) HTN  5) Acute/Chronic kidney failure, stage IV 2.8->3.9> 5.1  6) CAD s/p CABG 2010  Plan/Discussion:    She is now on HD. Wants to proceed with cath today.   Plan RHC/LHC later today to assess coronaries and hemodynamics. Continue low dose carvedilol and add back hydralazine/imdur as BP tolerates (if ok with Renal).  No Ace due to CKD.   Length of Stay: 11 Fausto Sampedro MD 08/14/2013, 10:23 AM  Advanced Heart Failure Team Pager [phone] (M-F; 7a - 4p)      

## 2013-08-20 NOTE — Interval H&P Note (Signed)
History and Physical Interval Note:  08/11/2013 11:52 AM  Veronica Copeland  has presented today for surgery, with the diagnosis of Heart failure  The various methods of treatment have been discussed with the patient and family. After consideration of risks, benefits and other options for treatment, the patient has consented to  Procedure(s): LEFT AND RIGHT HEART CATHETERIZATION WITH CORONARY ANGIOGRAM (N/A) possible angioplastyCath Lab Visit (complete for each Cath Lab visit)  Clinical Evaluation Leading to the Procedure:   ACS: no  Non-ACS:    Anginal Classification: CCS III  Anti-ischemic medical therapy: Minimal Therapy (1 class of medications)  Non-Invasive Test Results: No non-invasive testing performed  Prior CABG: Previous CABG       as a surgical intervention .  [name] history has been reviewed, patient examined, no change in status, stable for surgery.  I have reviewed the patient's chart and labs.  Questions were answered to the patient's satisfaction.     Ameliyah Sarno

## 2013-08-20 NOTE — Progress Notes (Addendum)
Paged PA as heparin SQ was scheduled to be given post-cath.  Per PA, okay to give SQ heparin.  Visualized and palpated right femoral site prior to administration and noted small marking of blood on gauze dressing in right groin.  Applied pressure and noted that the gauze dressing was saturated with blood.  Held firm pressure on site while charge nurse called called cath lab.  Rodney from cath lab came up and assessed site; applied pressure, and redressed site.  Right groin site is a level 0.  Vital signs stable. Will continue to monitor.

## 2013-08-20 NOTE — Progress Notes (Signed)
Patient returned to unit from Cath lab.  Easily arousable, oriented x4.  Vital signs stable.  Right femoral site level 0.  Will continue to monitor.

## 2013-08-20 NOTE — Progress Notes (Signed)
[facility]  Subjective  - No problems with Diatek use for dialysis.   Objective 140/54 94 98.4 F (36.9 C) (Oral) 19 100%  Intake/Output Summary (Last 24 hours) at 08/23/2013 0745 Last data filed at 08/21/2013 2014  Gross per 24 hour  Intake    193 ml  Output    896 ml  Net   -703 ml    Catheter in place  Assessment/Planning: POD #1 Ultrasound guided placement of right IJ Diatek catheter (19 cm)    Veronica Copeland [facility] 08/16/2013 7:45 AM --  Laboratory Lab Results: No results found for this basename: WBC, HGB, HCT, PLT,  in the last 72 hours BMET  Recent Labs  08/18/2013 0712 08/22/2013 0435  NA 132* 137  K 4.5 4.7  CL 86* 94*  CO2 28 29  GLUCOSE 184* 134*  BUN 127* 74*  CREATININE 6.70* 4.84*  CALCIUM 10.0 9.0    COAG Lab Results  Component Value Date   INR 1.1 03/29/2009   INR 0.9 03/27/2009   INR 1.0 10/14/2008   No results found for this basename: PTT

## 2013-08-20 NOTE — CV Procedure (Addendum)
Cardiac Cath Procedure Note  Indication: CAD with new LV dysfunction. Chest pain  Procedures performed:  1) Right heart cathererization 2) Selective coronary angiography 3) Left heart catheterization 4) Left ventriculogram  Description of procedure:     The risks and indication of the procedure were explained. Consent was signed and placed on the chart. An appropriate timeout was taken prior to the procedure. The right groin was prepped and draped in the routine sterile fashion and anesthetized with 1% local lidocaine.   We were unable to palpate a femoral pulse. We used ultrasound to locate the femoral artery.   A 5 FR arterial sheath was placed in the right femoral artery using a modified Seldinger technique. Standard catheters including a JL4, JR4 and angled pigtail were used. All catheter exchanges were made over a wire. A 7 FR venous sheath was placed in the right femoral vein using a modified Seldinger technique. A standard Swan-Ganz catheter was used for the procedure.   Complications:  None apparent  Findings:  RA = 3 RV = 29/0/7 PA = 28/10 (19) PCW = 13 Fick cardiac output/index = 4.0/2.6 PVR = 1.5 WU FA sat = 100% PA sat = 60%, 60%  Ao Pressure: 104/47 (71) LV Pressure: 107/7/20 There was no signficant gradient across the aortic valve on pullback.  Left main: Normal  LAD: Heavily calcified vessel. 60% long proximal lesion then totally occluded in the midsection  LCX: Small (2.25-2.36mm) vessel. Heavily calcified. 30% proximal. 95% focal mid lesion. Distal vessel diffuse 70-80%. Several tiny OMs.  RCA: Dominant vessel. Totally occluded ostially. Distal vessel fills through faint L to L collaterals  LIMA - LAD: Widely patent to mid LAD. 40-50% lesion in LAD after insertion of LIMA  SVG - RCA: Totally occluded ostially.  LV-gram done in the RAO projection: Ejection fraction ~ 40%. 2+ MR  Assessment:  1. Severe 3V CAD 2. LIMA patent 3. SVG to RCA -  occluded 4. Normal right heart cath 5. Ischemic CM with EF 40% by cath. 20-25% by echo  Plan/Discussion:  She has a high grade lesion in her LCX that is unprotected as well as loss of the SVG to the RCA. I reviewed the films with Dr. Martinique and we both feel that attempted PCI of the LCX would be high risk due to the heavy calcification and even if successful would carry a high-risk of restenosis given the small size and her DM2. Thus will treat medically for now. If has worsening angina can reconsider PCI. EF by cath appears better than echo. May be worth repeating echo in near future to help guide decision making and candidacy for ICD. Treat with b-blockers and IMDUR ad BP tolerates.  Glori Bickers, MD 12:44 PM

## 2013-08-21 LAB — GLUCOSE, CAPILLARY
Glucose-Capillary: 126 mg/dL — ABNORMAL HIGH (ref 70–99)
Glucose-Capillary: 183 mg/dL — ABNORMAL HIGH (ref 70–99)
Glucose-Capillary: 144 mg/dL — ABNORMAL HIGH (ref 70–99)
Glucose-Capillary: 274 mg/dL — ABNORMAL HIGH (ref 70–99)

## 2013-08-21 LAB — RENAL FUNCTION PANEL
Albumin: 3 g/dL — ABNORMAL LOW (ref 3.5–5.2)
BUN: 83 mg/dL — ABNORMAL HIGH (ref 6–23)
CO2: 25 meq/L (ref 19–32)
Calcium: 8.9 mg/dL (ref 8.4–10.5)
Chloride: 95 meq/L — ABNORMAL LOW (ref 96–112)
Creatinine, Ser: 5.95 mg/dL — ABNORMAL HIGH (ref 0.50–1.10)
GFR calc Af Amer: 8 mL/min — ABNORMAL LOW
GFR calc non Af Amer: 7 mL/min — ABNORMAL LOW
Glucose, Bld: 141 mg/dL — ABNORMAL HIGH (ref 70–99)
Phosphorus: 6 mg/dL — ABNORMAL HIGH (ref 2.3–4.6)
Potassium: 4.8 meq/L (ref 3.7–5.3)
Sodium: 136 meq/L — ABNORMAL LOW (ref 137–147)

## 2013-08-21 LAB — CBC
HCT: 28 % — ABNORMAL LOW (ref 36.0–46.0)
Hemoglobin: 8.8 g/dL — ABNORMAL LOW (ref 12.0–15.0)
MCH: 27.1 pg (ref 26.0–34.0)
MCHC: 31.4 g/dL (ref 30.0–36.0)
MCV: 86.2 fL (ref 78.0–100.0)
Platelets: 192 K/uL (ref 150–400)
RBC: 3.25 MIL/uL — ABNORMAL LOW (ref 3.87–5.11)
RDW: 15.5 % (ref 11.5–15.5)
WBC: 11.3 K/uL — ABNORMAL HIGH (ref 4.0–10.5)

## 2013-08-21 MED ORDER — HEPARIN SODIUM (PORCINE) 1000 UNIT/ML DIALYSIS
20.0000 [IU]/kg | INTRAMUSCULAR | Status: DC | PRN
  Administered 2013-08-21: 1000 [IU] via INTRAVENOUS_CENTRAL
  Filled 2013-08-21: qty 1

## 2013-08-21 NOTE — Progress Notes (Signed)
[facility] PROGRESS NOTE  Filed Weights   08/21/13 0406 08/21/13 1155 08/21/13 1438  Weight: 51.6 kg (113 lb 12.1 oz) 51.5 kg (113 lb 8.6 oz) 51.5 kg (113 lb 8.6 oz)        Intake/Output Summary (Last 24 hours) at 08/21/13 1508 Last data filed at 08/21/13 1438  Gross per 24 hour  Intake    966 ml  Output    550 ml  Net    416 ml    Assessment/Plan: Acute respiratory failure due to  Acute combined systolic and diastolic congestive heart failure and right sided PNA: - Started on HD  -status post diatek cath revision; will follow renal service for further HD treatemnts - no SOB, JVD or crackles on exam. -Cont metoprolol, hydralazine and imdur  - Finish course of antibiotics on 2.7.2014. - Appriciate advance heart failure team and nephrology assitance. -s/p [facility] cath (see results below); plan is for medical management for now.  1. Severe 3V CAD  2. LIMA patent  3. SVG to RCA - occluded  4. Normal right heart cath  5. Ischemic CM with EF 40% by cath. 20-25% by echo. Plan is to repeat 2-D echo in couple months and evaluate at that time needs for ICD  Cardiorenal syndrome CKD V: -renal on board -patient borderline uremic, with SCr 5.95, BUN 83 and Phosphorus of 6.0 -started on HD 2/12; plan is for second [place] on 2/14 -Will follow recommendations.  Type II or unspecified type diabetes mellitus without mention of complication, uncontrolled - HbgA1c 7.4, cont SSI and glipizide. - stable  HYPERTENSION - BP stable. -stable. Will monitor -imdur added today  Physical decondition: -will need SNF at discharge    Code Status: Full  Family Communication: Pt at bedside. Disposition Plan: SNF for rehab; once HD/clipping process initiaded   Consultants:  Advance heart failure  renal  Procedures: ECHO 5.7.3220: Systolic function was severely reduced. The estimated ejection fraction was in the range of 20% to 25%. Doppler parameters are consistent with a restrictive  pattern, indicative of decreased left ventricular diastolic compliance and/or increased left atrial pressure (grade 3 diastolic dysfunction). Doppler parameters are consistent with both elevated ventricular end-diastolic filling pressure and elevated left atrial filling pressure.   right and left heart catheter (2/13)  Antibiotics:  None  HPI/Subjective: No CP, no SOB. Feeling ok and asking when she would be discharge  Objective: Filed Vitals:   08/21/13 1400 08/21/13 1430 08/21/13 1438 08/21/13 1500  BP: 105/39 108/53 108/54 97/57  Pulse: 80 86 82 90  Temp:   98.3 F (36.8 C) 98 F (36.7 C)  TempSrc:   Oral   Resp:   18 18  Height:      Weight:   51.5 kg (113 lb 8.6 oz)   SpO2:   99% 100%     Exam: General: Alert, awake, oriented x3, in no acute distress.  HEENT: No bruits, no goiter. No JVD Heart: Regular rate and rhythm, without murmurs, rubs, gallops.  Lungs: Good air movement, clear to auscultation. Abdomen: Soft, nontender, nondistended, positive bowel sounds.   Data Reviewed: Basic Metabolic Panel:  Recent Labs Lab 08/17/13 1630 08/18/13 0347 08/23/2013 0712 09/01/2013 0435 25-Aug-2013 1540 08/21/13 0514  NA 136* 134* 132* 137  --  136*  K 4.7 4.3 4.5 4.7  --  4.8  CL 89* 89* 86* 94*  --  95*  CO2 28 26 28 29   --  25  GLUCOSE 174* 188* 184* 134*  --  141*  BUN 98* 110* 127* 74*  --  83*  CREATININE 6.50* 6.53* 6.70* 4.84* 5.14* 5.95*  CALCIUM 9.6 9.2 10.0 9.0  --  8.9  PHOS 7.1* 7.0* 6.9* 5.6*  --  6.0*   Liver Function Tests:  Recent Labs Lab 08/17/13 1630 08/18/13 0347 08/18/2013 0712 09/01/2013 0435 08/21/13 0514  ALBUMIN 3.1* 3.0* 3.4* 3.2* 3.0*   CBC:  Recent Labs Lab 08/15/13 0411 08/26/2013 1540 08/21/13 1219  WBC 10.4 8.8 11.3*  HGB 9.5* 9.7* 8.8*  HCT 28.9* 31.3* 28.0*  MCV 82.1 86.5 86.2  PLT 240 226 192   BNP (last 3 results)  Recent Labs  08/26/2013 1609  PROBNP 22793.0*   CBG:  Recent Labs Lab 09/04/2013 1355  08/10/2013 1640 08/11/2013 2142 08/21/13 0551 08/21/13 1104  GLUCAP 96 81 108* 126* 183*    Recent Results (from the past 240 hour(s))  URINE CULTURE     Status: None   Collection Time    08/13/13 12:26 AM      Result Value Ref Range Status   Specimen Description URINE, RANDOM   Final   Special Requests NONE   Final   Culture  Setup Time     Final   Value: 08/13/2013 08:48     Performed at [facility]     Final   Value: NO GROWTH     Performed at [facility]   Culture     Final   Value: NO GROWTH     Performed at [facility]   Report Status 08/14/2013 FINAL   Final     Studies: No results found.  Scheduled Meds: . alteplase (TPA-ACTIVASE) *DIALYSIS CATH* 5 mg infusion  5 mg Intravenous Once  . antiseptic oral rinse  15 mL Mouth Rinse BID  . aspirin  81 mg Oral Daily  . calcitRIOL  0.25 mcg Oral Once per day on Mon Wed Fri  . calcium acetate  1,334 mg Oral TID WC  . carvedilol  3.125 mg Oral BID WC  . citalopram  10 mg Oral Daily  . darbepoetin (ARANESP) injection - NON-DIALYSIS  60 mcg Subcutaneous Q Wed-1800  . glimepiride  0.5 mg Oral Q breakfast  . heparin  5,000 Units Subcutaneous 3 times per day  . insulin aspart  0-9 Units Subcutaneous TID WC  . isosorbide mononitrate  30 mg Oral Daily  . loratadine  10 mg Oral Daily  . simvastatin  10 mg Oral q1800  . sodium chloride  3 mL Intravenous Q12H   Continuous Infusions: . sodium chloride 10 mL/hr (08/29/2013 0959)   Time < 30 minutes  Baili Stang  [facility] Pager [phone] If 8PM-8AM, please contact night-coverage at [email or web address], password [facility] 08/21/2013, 3:08 PM  LOS: 12 days

## 2013-08-21 NOTE — Progress Notes (Signed)
Patient had c/o right neck pain. PRN Oxy administered as ordered. Patient currently resting comfortably, will continue to monitor. Tresa Endo

## 2013-08-21 NOTE — Progress Notes (Signed)
Admit: 08/11/2013 LOS: 12  19F AoCKD in setting of biventricular S/D HF, volume overload with SOB, orthopnea and cough.  Renal failure has progressed during hospitalization- now ESRD  Subjective: Events noted- had cardiac cath yest - found lesion in cx- felt PCI would be high risk, treat medically for now.  Due for second HD today, vascular would like to do AVF on Tuesday  02/13 0701 - 02/14 0700 In: 42 [P.O.:840; I.V.:6] Out: 850 [Urine:850]  Filed Weights   08/14/2013 1826 08/11/2013 0508 08/21/13 0406  Weight: 51.6 kg (113 lb 12.1 oz) 51.2 kg (112 lb 14 oz) 51.6 kg (113 lb 12.1 oz)    Current meds: reviewed  Current Labs: reviewed    Physical Exam:  Blood pressure 154/43, pulse 93, temperature 98.9 F (37.2 C), temperature source Oral, resp. rate 18, height 5' 3.75" (1.619 m), weight 51.6 kg (113 lb 12.1 oz), SpO2 99.00%. NAD, sitting at bedside bathing- "i feel good" RRR, no s3 or s4 Faint bibasilar crackles, L>R, nl wob No LEE Right sided PC    Assessment/Plan  19F AoCKD in setting of biventricular S/D HF, volume overload with SOB, orthopnea and cough.  Renal failure has progressed during hospitalization- 1. Renal- AoCKD (BL SCr 2s; but was 3.1 07/2012 at [facility]) 2/2 cardiorenal syndrome.  Definitely now shown progression.  It was previously difficult to know what exactly to do but now she is declaring herself.  Will go ahead and declare her ESRD- has PC  (appreciate VVS prompt service)  first HD treatment was 2/12-  second today (saturday) Will also initiate search for OP dialysis arrangements. Fistula placement planned for Tuesday.  PD will need to be down the road after she is hopefully more tuned.   2.    Cardiac/Biventricular S/D CHF, acute on chronic; LVEF 20-25% + DD - has experienced drop in EF since last eval'd- s/p cath with lesion in cx- no intervention planned as felt too risky, medical management for now 2. 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 HTN, stable- coreg and bidil    Rozanne Heumann A  08/21/2013, 8:38 AM   Recent Labs Lab 08/21/2013 0712 09/04/2013 0435 08/19/2013 1540 08/21/13 0514  NA 132* 137  --  136*  K 4.5 4.7  --  4.8  CL 86* 94*  --  95*  CO2 28 29  --  25  GLUCOSE 184* 134*  --  141*  BUN 127* 74*  --  83*  CREATININE 6.70* 4.84* 5.14* 5.95*  CALCIUM 10.0 9.0  --  8.9  PHOS 6.9* 5.6*  --  6.0*    Recent Labs Lab 08/15/13 0411 09/04/2013 1540  WBC 10.4 8.8  HGB 9.5* 9.7*  HCT 28.9* 31.3*  MCV 82.1 86.5  PLT 240 226

## 2013-08-21 NOTE — Progress Notes (Signed)
Bed rest over after cardiac cath.  Patient ambulated to bedside commode and then to chair with assistance.  No bleeding noted to site.  Will continue to monitor.

## 2013-08-21 NOTE — Progress Notes (Signed)
Advanced Heart Failure Rounding Note   Subjective:   Pt was examined in dialysis.  [name] is a 68 y.o. female hx of CAD s/p CABG (2010), CKD IV (baseline Cr 2.8/GFR 15-20), DVT not on coumadin, DM, HTN and newly diagnosed systolic HF. EF 2011 60-65%   She has been doing fairly well and reports that she was able to get around well with a walker and perfrom all her ADLs until last Thursday after she had her pneummonia vaccine. She reports that following vaccine that over the next couple of days she noticed that she had SOB and orthopnea. Denies any CP (no CP with prior MI). Lives alone. She reports she follows closely with Dr. Jenny Reichmann and takes her medications as prescribed. Reports that she follows with renal closely and they discussed starting HD or renal transplant however it was placed on hold because she had mass found in R breast and they were concerned cancer, however CT showed incidental mass.   Pertinent labs on admission were pro-BNP 22,000, Cr 2.9, HIV negative, TSH 1.11 SBPs 120-150   Echo 08/10/13:  LVEF 20-25% with regional wall motion abnormalities. Moderate RV dysfunction. +restrictive physiology   Started on milrinone with no improvement. Milrinone stopped due to worsening renal function.   Underwent HD for first time yesterday.   Cath on 2/13 reveals:  LAD - long 60% LAD stenosis, then occluded at mid point Lcx:  Small, calcified prox 30, 95% focal mid stensis, diffuse 70-80% , several tiny OMs RCA: occluded. LIMA to LAD - patent, there is a 50% mid LAD stenosis distal to insertion SVG -RCA - occluded at origin. Medical therapy was felt to be her best option.   Objective:   Weight Range:  Vital Signs:   Temp:  [98.2 F (36.8 C)-98.9 F (37.2 C)] 98.5 F (36.9 C) (02/14 1155) Pulse Rate:  [81-93] 81 (02/14 1300) Resp:  [16-20] 20 (02/14 1155) BP: (106-161)/(39-65) 106/39 mmHg (02/14 1300) SpO2:  [97 %-100 %] 97 % (02/14 1155) Weight:  [113 lb 8.6 oz (51.5 kg)-113 lb  12.1 oz (51.6 kg)] 113 lb 8.6 oz (51.5 kg) (02/14 1155) Last BM Date: 08/16/13  Weight change: Filed Weights   09/01/2013 0508 08/21/13 0406 08/21/13 1155  Weight: 112 lb 14 oz (51.2 kg) 113 lb 12.1 oz (51.6 kg) 113 lb 8.6 oz (51.5 kg)    Intake/Output:   Intake/Output Summary (Last 24 hours) at 08/21/13 1319 Last data filed at 08/21/13 4782  Gross per 24 hour  Intake   1086 ml  Output    650 ml  Net    436 ml     Physical Exam: General: Elderly appearing. Examined up in dialysis HEENT: normal  Neck: supple. JVP 6-7 with prominent CV waves . Carotids 2+ bilat; no bruits. No lymphadenopathy or thryomegaly appreciated. R upper chest dialysis catheter.  Cor: PMI nondisplaced. Regular rate & rhythm. No rubs, gallops or murmurs.  Lungs: clear  Abdomen: soft, nontender,. No hepatosplenomegaly. No bruits or masses. Good bowel sounds.  Extremities: cath site is bandaged, clean and dry, no hematoma Neuro: alert & orientedx3, cranial nerves grossly intact. moves all 4 extremities w/o difficulty. Affect pleasant  Telemetry: SR 90s    Labs: Basic Metabolic Panel:  Recent Labs Lab 08/17/13 1630 08/18/13 0347 08/24/2013 0712 08/18/2013 0435 08/11/2013 1540 08/21/13 0514  NA 136* 134* 132* 137  --  136*  K 4.7 4.3 4.5 4.7  --  4.8  CL 89* 89* 86* 94*  --  95*  CO2 28 26 28 29   --  25  GLUCOSE 174* 188* 184* 134*  --  141*  BUN 98* 110* 127* 74*  --  83*  CREATININE 6.50* 6.53* 6.70* 4.84* 5.14* 5.95*  CALCIUM 9.6 9.2 10.0 9.0  --  8.9  PHOS 7.1* 7.0* 6.9* 5.6*  --  6.0*    Liver Function Tests:  Recent Labs Lab 08/17/13 1630 08/18/13 0347 09/04/2013 0712 08/22/2013 0435 08/21/13 0514  ALBUMIN 3.1* 3.0* 3.4* 3.2* 3.0*   No results found for this basename: LIPASE, AMYLASE,  in the last 168 hours No results found for this basename: AMMONIA,  in the last 168 hours  CBC:  Recent Labs Lab 08/15/13 0411 08/29/2013 1540 08/21/13 1219  WBC 10.4 8.8 11.3*  HGB 9.5* 9.7* 8.8*   HCT 28.9* 31.3* 28.0*  MCV 82.1 86.5 86.2  PLT 240 226 192    Cardiac Enzymes: No results found for this basename: CKTOTAL, CKMB, CKMBINDEX, TROPONINI,  in the last 168 hours  BNP: BNP (last 3 results)  Recent Labs  08/23/2013 1609  PROBNP 22793.0*     Other results:  Imaging: No results found.   Medications:     Scheduled Medications: . alteplase (TPA-ACTIVASE) *DIALYSIS CATH* 5 mg infusion  5 mg Intravenous Once  . antiseptic oral rinse  15 mL Mouth Rinse BID  . aspirin  81 mg Oral Daily  . calcitRIOL  0.25 mcg Oral Once per day on Mon Wed Fri  . calcium acetate  1,334 mg Oral TID WC  . carvedilol  3.125 mg Oral BID WC  . citalopram  10 mg Oral Daily  . darbepoetin (ARANESP) injection - NON-DIALYSIS  60 mcg Subcutaneous Q Wed-1800  . glimepiride  0.5 mg Oral Q breakfast  . heparin  5,000 Units Subcutaneous 3 times per day  . insulin aspart  0-9 Units Subcutaneous TID WC  . isosorbide mononitrate  30 mg Oral Daily  . loratadine  10 mg Oral Daily  . simvastatin  10 mg Oral q1800  . sodium chloride  3 mL Intravenous Q12H    Infusions: . sodium chloride 10 mL/hr (08/16/2013 0959)    PRN Medications: sodium chloride, fluticasone, heparin, hydrALAZINE, HYDROcodone-acetaminophen, nitroGLYCERIN, ondansetron (ZOFRAN) IV, ondansetron, oxyCODONE-acetaminophen, polyethylene glycol, sodium chloride, traMADol   Assessment:   1) Acute combined systolic/diastolic HF  - EF 92-33%, grade III DD, RV mildly dilated and sys fx mod reduced .  She has severe CAD - no good options for revascularization although the LAD system remains patent.  Continue maximial medical therapy.  2) Cardiorenal syndrome  3) DM  4) HTN  5) Acute/Chronic kidney failure, stage IV 2.8->3.9> 5.1  6) CAD s/p CABG 2010  Plan/Discussion:      Plan RHC/LHC later today to assess coronaries and hemodynamics. Continue low dose carvedilol and add back hydralazine/imdur as BP tolerates (if ok with  Renal).  No Ace due to CKD.   Length of Stay: [place]. MD 08/21/2013, 1:19 PM  Advanced Heart Failure Team Pager [phone] (M-F; 7a - 4p)

## 2013-08-22 DIAGNOSIS — J9600 Acute respiratory failure, unspecified whether with hypoxia or hypercapnia: Secondary | ICD-10-CM

## 2013-08-22 LAB — RENAL FUNCTION PANEL
Albumin: 2.8 g/dL — ABNORMAL LOW (ref 3.5–5.2)
BUN: 42 mg/dL — ABNORMAL HIGH (ref 6–23)
CALCIUM: 8.8 mg/dL (ref 8.4–10.5)
CHLORIDE: 97 meq/L (ref 96–112)
CO2: 25 meq/L (ref 19–32)
Creatinine, Ser: 5.1 mg/dL — ABNORMAL HIGH (ref 0.50–1.10)
GFR, EST AFRICAN AMERICAN: 9 mL/min — AB (ref >90)
GFR, EST NON AFRICAN AMERICAN: 8 mL/min — AB (ref >90)
Glucose, Bld: 130 mg/dL — ABNORMAL HIGH (ref 70–99)
POTASSIUM: 4.4 meq/L (ref 3.7–5.3)
Phosphorus: 3.8 mg/dL (ref 2.3–4.6)
SODIUM: 137 meq/L (ref 137–147)

## 2013-08-22 LAB — GLUCOSE, CAPILLARY
GLUCOSE-CAPILLARY: 217 mg/dL — AB (ref 70–99)
GLUCOSE-CAPILLARY: 160 mg/dL — AB (ref 70–99)
Glucose-Capillary: 123 mg/dL — ABNORMAL HIGH (ref 70–99)
Glucose-Capillary: 109 mg/dL — ABNORMAL HIGH (ref 70–99)

## 2013-08-22 MED ORDER — ATORVASTATIN CALCIUM 40 MG PO TABS
40.0000 mg | ORAL_TABLET | Freq: Every day | ORAL | Status: DC
  Administered 2013-08-23: 40 mg via ORAL
  Filled 2013-08-22 (×2): qty 1

## 2013-08-22 MED ORDER — DARBEPOETIN ALFA-POLYSORBATE 100 MCG/0.5ML IJ SOLN
100.0000 ug | INTRAMUSCULAR | Status: DC
  Filled 2013-08-22: qty 0.5

## 2013-08-22 NOTE — Progress Notes (Signed)
Subjective:      Veronica Copeland is a 68 y.o. female hx of CAD s/p CABG (2010), CKD IV (baseline Cr 2.8/GFR 15-20), DVT not on coumadin, DM, HTN and newly diagnosed systolic HF. EF 2011 60-65%   She has been doing fairly well and reports that she was able to get around well with a walker and perfrom all her ADLs until last Thursday after she had her pneummonia vaccine. She reports that following vaccine that over the next couple of days she noticed that she had SOB and orthopnea. Denies any CP (no CP with prior MI). Lives alone. She reports she follows closely with Dr. Jenny Reichmann and takes her medications as prescribed. Reports that she follows with renal closely and they discussed starting HD or renal transplant however it was placed on hold because she had mass found in R breast and they were concerned cancer, however CT showed incidental mass.   Pertinent labs on admission were pro-BNP 22,000, Cr 2.9, HIV negative, TSH 1.11 SBPs 120-150   Echo 08/10/13:  LVEF 20-25% with regional wall motion abnormalities. Moderate RV dysfunction. +restrictive physiology   Started on milrinone with no improvement. Milrinone stopped due to worsening renal function.   Just started HD this admission   Cath on 2/13 reveals:  LAD - long 60% LAD stenosis, then occluded at mid point Lcx:  Small, calcified prox 30, 95% focal mid stensis, diffuse 70-80% , several tiny OMs RCA: occluded. LIMA to LAD - patent, there is a 50% mid LAD stenosis distal to insertion SVG -RCA - occluded at origin. Medical therapy was felt to be her best option.   Complains of pain at the right neck dialysis catheter site.  Objective:   Weight Range:  Vital Signs:   Temp:  [98 F (36.7 C)-98.8 F (37.1 C)] 98.2 F (36.8 C) (02/15 0620) Pulse Rate:  [80-102] 100 (02/15 0620) Resp:  [18-20] 19 (02/15 0620) BP: (97-124)/(39-57) 108/43 mmHg (02/15 0620) SpO2:  [97 %-100 %] 100 % (02/15 0620) Weight:  [113 lb 8.6 oz (51.5 kg)-114 lb 6.4  oz (51.891 kg)] 114 lb 6.4 oz (51.891 kg) (02/15 0620) Last BM Date: 08/18/13  Weight change: Filed Weights   08/21/13 1155 08/21/13 1438 08/22/13 0620  Weight: 113 lb 8.6 oz (51.5 kg) 113 lb 8.6 oz (51.5 kg) 114 lb 6.4 oz (51.891 kg)    Intake/Output:   Intake/Output Summary (Last 24 hours) at 08/22/13 1010 Last data filed at 08/21/13 2040  Gross per 24 hour  Intake    240 ml  Output      0 ml  Net    240 ml     Physical Exam: General: Elderly appearing.  sleepy HEENT: normal  Neck: supple. JVP 6-7 with prominent CV waves . Carotids 2+ bilat; no bruits. No lymphadenopathy or thryomegaly appreciated. R upper chest dialysis catheter ( which is tender to touch)  Cor: PMI nondisplaced. Regular rate & rhythm. No rubs, gallops or murmurs.  Lungs: clear  Abdomen: soft, nontender,. No hepatosplenomegaly. No bruits or masses. Good bowel sounds.  Extremities: cath site is bandaged, clean and dry, no hematoma Neuro: alert & orientedx3, cranial nerves grossly intact. moves all 4 extremities w/o difficulty. Affect pleasant  Telemetry: SR 90s    Labs: Basic Metabolic Panel:  Recent Labs Lab 08/18/13 0347 08/11/2013 0712 08/16/2013 0435 09/03/2013 1540 08/21/13 0514 08/22/13 0440  NA 134* 132* 137  --  136* 137  K 4.3 4.5 4.7  --  4.8 4.4  CL 89* 86* 94*  --  95* 97  CO2 26 28 29   --  25 25  GLUCOSE 188* 184* 134*  --  141* 130*  BUN 110* 127* 74*  --  83* 42*  CREATININE 6.53* 6.70* 4.84* 5.14* 5.95* 5.10*  CALCIUM 9.2 10.0 9.0  --  8.9 8.8  PHOS 7.0* 6.9* 5.6*  --  6.0* 3.8    Liver Function Tests:  Recent Labs Lab 08/18/13 0347 08/30/2013 0712 2013/09/15 0435 08/21/13 0514 08/22/13 0440  ALBUMIN 3.0* 3.4* 3.2* 3.0* 2.8*   No results found for this basename: LIPASE, AMYLASE,  in the last 168 hours No results found for this basename: AMMONIA,  in the last 168 hours  CBC:  Recent Labs Lab 09-15-13 1540 08/21/13 1219  WBC 8.8 11.3*  HGB 9.7* 8.8*  HCT 31.3* 28.0*    MCV 86.5 86.2  PLT 226 192    Cardiac Enzymes: No results found for this basename: CKTOTAL, CKMB, CKMBINDEX, TROPONINI,  in the last 168 hours  BNP: BNP (last 3 results)  Recent Labs  09/03/2013 1609  PROBNP 22793.0*     Other results:  Imaging: No results found.   Medications:     Scheduled Medications: . alteplase (TPA-ACTIVASE) *DIALYSIS CATH* 5 mg infusion  5 mg Intravenous Once  . antiseptic oral rinse  15 mL Mouth Rinse BID  . aspirin  81 mg Oral Daily  . calcitRIOL  0.25 mcg Oral Once per day on Mon Wed Fri  . calcium acetate  1,334 mg Oral TID WC  . carvedilol  3.125 mg Oral BID WC  . citalopram  10 mg Oral Daily  . [START ON 08/17/2013] darbepoetin (ARANESP) injection - NON-DIALYSIS  100 mcg Subcutaneous Q Wed-1800  . glimepiride  0.5 mg Oral Q breakfast  . heparin  5,000 Units Subcutaneous 3 times per day  . insulin aspart  0-9 Units Subcutaneous TID WC  . isosorbide mononitrate  30 mg Oral Daily  . loratadine  10 mg Oral Daily  . simvastatin  10 mg Oral q1800  . sodium chloride  3 mL Intravenous Q12H    Infusions: . sodium chloride 10 mL/hr (08/08/2013 0959)    PRN Medications: sodium chloride, fluticasone, hydrALAZINE, HYDROcodone-acetaminophen, nitroGLYCERIN, ondansetron (ZOFRAN) IV, ondansetron, oxyCODONE-acetaminophen, polyethylene glycol, sodium chloride, traMADol   Assessment:   1) Acute combined systolic/diastolic HF  - EF 69-67%, grade III DD, RV mildly dilated and sys fx mod reduced .  She has severe CAD - no good options for revascularization although the LAD system remains patent.  Continue maximial medical therapy. 2) Cardiorenal syndrome  3) DM  4) HTN  5) Acute/Chronic kidney failure, stage IV - she has started dialysis. 6) CAD s/p CABG 2010  Plan/Discussion:

## 2013-08-22 NOTE — Progress Notes (Signed)
[facility] PROGRESS NOTE  Filed Weights   08/21/13 1155 08/21/13 1438 08/22/13 0620  Weight: 51.5 kg (113 lb 8.6 oz) 51.5 kg (113 lb 8.6 oz) 51.891 kg (114 lb 6.4 oz)        Intake/Output Summary (Last 24 hours) at 08/22/13 1532 Last data filed at 08/22/13 1427  Gross per 24 hour  Intake    483 ml  Output    200 ml  Net    283 ml    Assessment/Plan: Acute respiratory failure due to  Acute combined systolic and diastolic congestive heart failure and right sided PNA: - Started on HD (2/12) -status post diatek cath revision; will follow renal service for further HD treatemnts - no SOB, JVD or crackles on exam. -Cont metoprolol, hydralazine and imdur (concerns of limitations current treatment with ongoing dialysis and soft blood pressure) - Finish course of antibiotics on 2.7.2014. -Next hemodialysis treatment plan for 2/16 and then fistula placement on 2/17 - Appriciate advance heart failure team and nephrology assitance. -s/p [facility] cath (see results below); plan is for medical management for now.  1. Severe 3V CAD  2. LIMA patent  3. SVG to RCA - occluded  4. Normal right heart cath  5. Ischemic CM with EF 40% by cath. 20-25% by echo. Plan is to repeat 2-D echo in couple months and evaluate at that time needs for ICD  Cardiorenal syndrome CKD V: Now end-stage renal disease -renal on board -patient borderline uremic, with SCr 5.95, BUN 83 and Phosphorus of 6.0 -started on HD 2/12;  second Schlater on 2/14; next treatment to 2/16 -2/17 fistula placement for long-term access -Will follow recommendations.  Type II or unspecified type diabetes mellitus without mention of complication, uncontrolled - HbgA1c 7.4, cont SSI and glipizide. - stable  HYPERTENSION - BP soft but overall stable. Patient denies lightheadedness or dizziness -stable. Will monitor -imdur added today  Physical decondition: -will need SNF at discharge for physical rehabilitation   Code Status: Full   Family Communication: Pt at bedside. Disposition Plan: SNF for rehab; once HD/clipping process initiaded   Consultants:  Advance heart failure  renal  Procedures: ECHO 1.6.1096: Systolic function was severely reduced. The estimated ejection fraction was in the range of 20% to 25%. Doppler parameters are consistent with a restrictive pattern, indicative of decreased left ventricular diastolic compliance and/or increased left atrial pressure (grade 3 diastolic dysfunction). Doppler parameters are consistent with both elevated ventricular end-diastolic filling pressure and elevated left atrial filling pressure.   right and left heart catheter (2/13)  Antibiotics:  None  HPI/Subjective: No CP, no SOB. Feeling good and enthusiastic about treatment plans.  Objective: Filed Vitals:   08/21/13 1500 08/21/13 2037 08/22/13 0620 08/22/13 1428  BP: 97/57 123/43 108/43 100/44  Pulse: 90 102 100 94  Temp: 98 F (36.7 C) 98.8 F (37.1 C) 98.2 F (36.8 C) 98 F (36.7 C)  TempSrc:  Oral Oral Oral  Resp: 18 18 19 20   Height:      Weight:   51.891 kg (114 lb 6.4 oz)   SpO2: 100% 100% 100% 100%     Exam: General: Alert, awake, oriented x3, in no acute distress.  HEENT: No bruits, no goiter. No JVD Heart: Regular rate and rhythm, without murmurs, rubs, gallops.  Lungs: Good air movement, clear to auscultation. Abdomen: Soft, nontender, nondistended, positive bowel sounds.   Data Reviewed: Basic Metabolic Panel:  Recent Labs Lab 08/18/13 0347 Sep 17, 2013 0454 08/10/2013 0435 09/01/2013 1540 08/21/13 0514  08/22/13 0440  NA 134* 132* 137  --  136* 137  K 4.3 4.5 4.7  --  4.8 4.4  CL 89* 86* 94*  --  95* 97  CO2 26 28 29   --  25 25  GLUCOSE 188* 184* 134*  --  141* 130*  BUN 110* 127* 74*  --  83* 42*  CREATININE 6.53* 6.70* 4.84* 5.14* 5.95* 5.10*  CALCIUM 9.2 10.0 9.0  --  8.9 8.8  PHOS 7.0* 6.9* 5.6*  --  6.0* 3.8   Liver Function Tests:  Recent Labs Lab 08/18/13 0347  31-Aug-2013 0712 08/30/2013 0435 08/21/13 0514 08/22/13 0440  ALBUMIN 3.0* 3.4* 3.2* 3.0* 2.8*   CBC:  Recent Labs Lab 08/08/2013 1540 08/21/13 1219  WBC 8.8 11.3*  HGB 9.7* 8.8*  HCT 31.3* 28.0*  MCV 86.5 86.2  PLT 226 192   BNP (last 3 results)  Recent Labs  08/21/2013 1609  PROBNP 22793.0*   CBG:  Recent Labs Lab 08/21/13 1104 08/21/13 1621 08/21/13 2136 08/22/13 0659 08/22/13 1144  GLUCAP 183* 144* 274* 123* 217*    Recent Results (from the past 240 hour(s))  URINE CULTURE     Status: None   Collection Time    08/13/13 12:26 AM      Result Value Ref Range Status   Specimen Description URINE, RANDOM   Final   Special Requests NONE   Final   Culture  Setup Time     Final   Value: 08/13/2013 08:48     Performed at [place]     Final   Value: NO GROWTH     Performed at [facility]   Culture     Final   Value: NO GROWTH     Performed at [facility]   Report Status 08/14/2013 FINAL   Final     Studies: No results found.  Scheduled Meds: . alteplase (TPA-ACTIVASE) *DIALYSIS CATH* 5 mg infusion  5 mg Intravenous Once  . antiseptic oral rinse  15 mL Mouth Rinse BID  . aspirin  81 mg Oral Daily  . calcitRIOL  0.25 mcg Oral Once per day on Mon Wed Fri  . calcium acetate  1,334 mg Oral TID WC  . carvedilol  3.125 mg Oral BID WC  . citalopram  10 mg Oral Daily  . [START ON 08/20/2013] darbepoetin (ARANESP) injection - NON-DIALYSIS  100 mcg Subcutaneous Q Wed-1800  . glimepiride  0.5 mg Oral Q breakfast  . heparin  5,000 Units Subcutaneous 3 times per day  . insulin aspart  0-9 Units Subcutaneous TID WC  . isosorbide mononitrate  30 mg Oral Daily  . loratadine  10 mg Oral Daily  . simvastatin  10 mg Oral q1800  . sodium chloride  3 mL Intravenous Q12H   Continuous Infusions: . sodium chloride 10 mL/hr (08/31/2013 0959)   Time < 30 minutes  Chantee Cerino  [facility] Pager [phone] If 8PM-8AM, please  contact night-coverage at [email or web address], password [facility] 08/22/2013, 3:32 PM  LOS: 13 days

## 2013-08-22 NOTE — Progress Notes (Signed)
Admit: 08/12/2013 LOS: 64  15F AoCKD in setting of biventricular S/D HF, volume overload with SOB, orthopnea and cough.  Renal failure has progressed during hospitalization- now ESRD  Subjective: Events noted- had dialysis yest- she said went much better- BP lowish -  02/14 0701 - 02/15 0700 In: 480 [P.O.:480] Out: 0   Filed Weights   08/21/13 1155 08/21/13 1438 08/22/13 0620  Weight: 51.5 kg (113 lb 8.6 oz) 51.5 kg (113 lb 8.6 oz) 51.891 kg (114 lb 6.4 oz)    Current meds: reviewed  Current Labs: reviewed    Physical Exam:  Blood pressure 108/43, pulse 100, temperature 98.2 F (36.8 C), temperature source Oral, resp. rate 19, height 5' 3.75" (1.619 m), weight 51.891 kg (114 lb 6.4 oz), SpO2 100.00%. NAD, sitting at bedside bathing- "i feel good" RRR, no s3 or s4 Faint bibasilar crackles, L>R, nl wob No LEE Right sided PC    Assessment/Plan  15F AoCKD in setting of biventricular S/D HF, volume overload with SOB, orthopnea and cough.  Renal failure has progressed during hospitalization- 1. Renal- AoCKD (BL SCr 2s; but was 3.1 07/2012 at [place]) 2/2 cardiorenal syndrome.  Definitely now shown progression.  It was previously difficult to know what exactly to do but now she is declaring herself.  Will go ahead and declare her ESRD- has PC  (appreciate [facility] prompt service)  first HD treatment was 2/12-  second 2/14.  Have initiated search for OP dialysis arrangements. Will write orders for 3rd HD tomorrow (Mon) Fistula placement planned for Tuesday.  PD will need to be down the road after she is hopefully more tuned.   2.    Cardiac/Biventricular S/D CHF, acute on chronic; LVEF 20-25% + DD - has experienced drop in EF since last eval'd- s/p cath with lesion in cx- no intervention planned as felt too risky, medical management for now 2. Anemia- TSAT 18%; likely IDA + Anemia of CKD- s/p iron and on ESA 3. 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 HTN/volume- low- am  worried about this in the setting of ESRD and hemodialysis- coreg only 3/125 and now imdur without hydralazine- not able to pull much with HD but not sure there is much there    Charnelle Bergeman A  08/22/2013, 9:12 AM   Recent Labs Lab 08/11/2013 0435 09/04/2013 1540 08/21/13 0514 08/22/13 0440  NA 137  --  136* 137  K 4.7  --  4.8 4.4  CL 94*  --  95* 97  CO2 29  --  25 25  GLUCOSE 134*  --  141* 130*  BUN 74*  --  83* 42*  CREATININE 4.84* 5.14* 5.95* 5.10*  CALCIUM 9.0  --  8.9 8.8  PHOS 5.6*  --  6.0* 3.8    Recent Labs Lab 08/16/2013 1540 08/21/13 1219  WBC 8.8 11.3*  HGB 9.7* 8.8*  HCT 31.3* 28.0*  MCV 86.5 86.2  PLT 226 192

## 2013-08-22 NOTE — Progress Notes (Signed)
   Chart reviewed.   Continues with HD. BP running low. Hydralazine stopped. Remains on low dose carvedillol and Imdur. Will stop Imdur.   Given CAD will change smiva 10 to atorva 40 to see if she tolerates (she is on prava 10 at home).   Not much else for the HF team to add at this point as volume status managed by HD,   She will need f/u in [facility] cardiology clinic soon after d/c.  We will follow at a distance. Please do not hesitate to call me with questions while she is in house.   Benay Spice 7:28 PM

## 2013-08-23 LAB — CBC
HCT: 27.9 % — ABNORMAL LOW (ref 36.0–46.0)
Hemoglobin: 8.9 g/dL — ABNORMAL LOW (ref 12.0–15.0)
MCH: 27.5 pg (ref 26.0–34.0)
MCHC: 31.9 g/dL (ref 30.0–36.0)
MCV: 86.1 fL (ref 78.0–100.0)
PLATELETS: 172 10*3/uL (ref 150–400)
RBC: 3.24 MIL/uL — ABNORMAL LOW (ref 3.87–5.11)
RDW: 15.9 % — AB (ref 11.5–15.5)
WBC: 11.7 10*3/uL — AB (ref 4.0–10.5)

## 2013-08-23 LAB — GLUCOSE, CAPILLARY
GLUCOSE-CAPILLARY: 98 mg/dL (ref 70–99)
Glucose-Capillary: 139 mg/dL — ABNORMAL HIGH (ref 70–99)
Glucose-Capillary: 204 mg/dL — ABNORMAL HIGH (ref 70–99)
Glucose-Capillary: 244 mg/dL — ABNORMAL HIGH (ref 70–99)

## 2013-08-23 LAB — RENAL FUNCTION PANEL
Albumin: 3.1 g/dL — ABNORMAL LOW (ref 3.5–5.2)
BUN: 57 mg/dL — ABNORMAL HIGH (ref 6–23)
CALCIUM: 9.6 mg/dL (ref 8.4–10.5)
CO2: 24 meq/L (ref 19–32)
Chloride: 89 mEq/L — ABNORMAL LOW (ref 96–112)
Creatinine, Ser: 6.61 mg/dL — ABNORMAL HIGH (ref 0.50–1.10)
GFR, EST AFRICAN AMERICAN: 7 mL/min — AB (ref >90)
GFR, EST NON AFRICAN AMERICAN: 6 mL/min — AB (ref >90)
Glucose, Bld: 160 mg/dL — ABNORMAL HIGH (ref 70–99)
POTASSIUM: 5.1 meq/L (ref 3.7–5.3)
Phosphorus: 4.3 mg/dL (ref 2.3–4.6)
SODIUM: 129 meq/L — AB (ref 137–147)

## 2013-08-23 MED ORDER — NEPRO/CARBSTEADY PO LIQD
237.0000 mL | ORAL | Status: DC | PRN
  Filled 2013-08-23: qty 237

## 2013-08-23 MED ORDER — PENTAFLUOROPROP-TETRAFLUOROETH EX AERO: 1.0000 "application " | INHALATION_SPRAY | CUTANEOUS | Status: DC | PRN

## 2013-08-23 MED ORDER — HEPARIN SODIUM (PORCINE) 5000 UNIT/ML IJ SOLN
5000.0000 [IU] | Freq: Three times a day (TID) | INTRAMUSCULAR | Status: DC
  Administered 2013-08-25: 5000 [IU] via SUBCUTANEOUS
  Filled 2013-08-23 (×7): qty 1

## 2013-08-23 MED ORDER — LIDOCAINE HCL (PF) 1 % IJ SOLN: 5.0000 mL | INTRAMUSCULAR | Status: DC | PRN

## 2013-08-23 MED ORDER — LIDOCAINE-PRILOCAINE 2.5-2.5 % EX CREA
1.0000 "application " | TOPICAL_CREAM | CUTANEOUS | Status: DC | PRN
  Filled 2013-08-23: qty 5

## 2013-08-23 MED ORDER — SODIUM CHLORIDE 0.9 % IV SOLN: 100.0000 mL | INTRAVENOUS | Status: DC | PRN

## 2013-08-23 MED ORDER — VANCOMYCIN HCL IN DEXTROSE 1-5 GM/200ML-% IV SOLN
1000.0000 mg | INTRAVENOUS | Status: AC
  Administered 2013-08-24: 1000 mg via INTRAVENOUS
  Filled 2013-08-23: qty 200

## 2013-08-23 MED ORDER — ALTEPLASE 2 MG IJ SOLR
2.0000 mg | Freq: Once | INTRAMUSCULAR | Status: DC | PRN
  Filled 2013-08-23: qty 2

## 2013-08-23 MED ORDER — HEPARIN SODIUM (PORCINE) 1000 UNIT/ML DIALYSIS: 1000.0000 [IU] | INTRAMUSCULAR | Status: DC | PRN

## 2013-08-23 MED ORDER — HEPARIN SODIUM (PORCINE) 1000 UNIT/ML DIALYSIS: 20.0000 [IU]/kg | INTRAMUSCULAR | Status: DC | PRN

## 2013-08-23 MED ORDER — HEPARIN SODIUM (PORCINE) 5000 UNIT/ML IJ SOLN
5000.0000 [IU] | Freq: Three times a day (TID) | INTRAMUSCULAR | Status: AC
  Administered 2013-08-23 (×2): 5000 [IU] via SUBCUTANEOUS
  Filled 2013-08-23 (×2): qty 1

## 2013-08-23 MED ORDER — HEPARIN SODIUM (PORCINE) 1000 UNIT/ML IJ SOLN
1000.0000 [IU] | Freq: Once | INTRAMUSCULAR | Status: AC
  Administered 2013-08-23: 1000 [IU] via INTRAVENOUS

## 2013-08-23 NOTE — Progress Notes (Signed)
[place] received form HD RN

## 2013-08-23 NOTE — Progress Notes (Signed)
PT Cancellation Note  Patient Details Name: Veronica Copeland MRN: [id number] DOB: 30-May-1946   Cancelled Treatment:    Reason Eval/Treat Not Completed: Fatigue/lethargy limiting ability to participate.  Pt back from dialysis and states that she is too fatigued to participate in therapy today.     Denice Bors 08/23/2013, 2:55 PM

## 2013-08-23 NOTE — Progress Notes (Signed)
0730 the patient not in the room . Endorsed as in HD unit

## 2013-08-23 NOTE — Progress Notes (Signed)
[facility] PROGRESS NOTE  Filed Weights   08/23/13 0617 08/23/13 0703 08/23/13 1022  Weight: 53.252 kg (117 lb 6.4 oz) 53.1 kg (117 lb 1 oz) 53.1 kg (117 lb 1 oz)        Intake/Output Summary (Last 24 hours) at 08/23/13 1639 Last data filed at 08/23/13 1414  Gross per 24 hour  Intake    680 ml  Output    200 ml  Net    480 ml    Assessment/Plan: Acute respiratory failure due to  Acute combined systolic and diastolic congestive heart failure and right sided PNA: - Started on HD (2/12) -status post diatek cath revision; will follow renal service for further HD treatements - no SOB, JVD or crackles on exam. -Cont metoprolol, hydralazine and imdur(discontinued due to low BP)  - Finish course of antibiotics on 2.7.2014. -AV fistula or graft placement plan for 2/17 - Appreciate advance heart failure team and nephrology assitance. -s/p [facility] cath (see results below); plan is for medical management for now.  1. Severe 3V CAD  2. LIMA patent  3. SVG to RCA - occluded  4. Normal right heart cath  5. Ischemic CM with EF 40% by cath. 20-25% by echo. Plan is to repeat 2-D echo in couple months and evaluate at that time needs for ICD  Cardiorenal syndrome CKD V: Now end-stage renal disease -renal on board -patient responding and tolerating HD well -started on HD 2/12;  second [place] on 2/14; third treatment on 2/16 -2/17 fistula or graft placement for long-term access -Will follow recommendations. -needs clipping  Type II or unspecified type diabetes mellitus without mention of complication, uncontrolled - HbgA1c 7.4, cont SSI and glipizide. - stable  HYPERTENSION -BP soft but overall stable. Patient denies lightheadedness or dizziness -imdur and hydralazine now discontinue to facilitate HD tx's -continue coreg  HLD -continue statins   Physical decondition: -will need SNF at discharge for physical rehabilitation   Code Status: Full  Family Communication: Pt at  bedside. Disposition Plan: SNF for rehab; once HD/clipping process initiaded   Consultants:  Advance heart failure  renal  Procedures: ECHO 4.6.8032: Systolic function was severely reduced. The estimated ejection fraction was in the range of 20% to 25%. Doppler parameters are consistent with a restrictive pattern, indicative of decreased left ventricular diastolic compliance and/or increased left atrial pressure (grade 3 diastolic dysfunction). Doppler parameters are consistent with both elevated ventricular end-diastolic filling pressure and elevated left atrial filling pressure.   right and left heart catheter (2/13)  Antibiotics:  None  HPI/Subjective: No CP, no SOB. Tolerating HD w/o problem  Objective: Filed Vitals:   08/23/13 1015 08/23/13 1022 08/23/13 1105 08/23/13 1414  BP: 129/56 139/83 133/56 108/62  Pulse: 85 81 85 110  Temp:  98.3 F (36.8 C)  98.2 F (36.8 C)  TempSrc:  Oral  Oral  Resp:  18 18 20   Height:      Weight:  53.1 kg (117 lb 1 oz)    SpO2:  100% 100% 100%     Exam: General: Alert, awake, oriented x3, in no acute distress.  HEENT: No bruits, no goiter. No JVD Heart: Regular rate and rhythm, without murmurs, rubs, gallops.  Lungs: Good air movement, clear to auscultation. Abdomen: Soft, nontender, nondistended, positive bowel sounds.   Data Reviewed: Basic Metabolic Panel:  Recent Labs Lab 08/26/2013 0712 09-01-13 0435 2013-09-01 1540 08/21/13 0514 08/22/13 0440 08/23/13 0628  NA 132* 137  --  136* 137 129*  K 4.5 4.7  --  4.8 4.4 5.1  CL 86* 94*  --  95* 97 89*  CO2 28 29  --  25 25 24   GLUCOSE 184* 134*  --  141* 130* 160*  BUN 127* 74*  --  83* 42* 57*  CREATININE 6.70* 4.84* 5.14* 5.95* 5.10* 6.61*  CALCIUM 10.0 9.0  --  8.9 8.8 9.6  PHOS 6.9* 5.6*  --  6.0* 3.8 4.3   Liver Function Tests:  Recent Labs Lab 08/15/2013 0712 08/30/2013 0435 08/21/13 0514 08/22/13 0440 08/23/13 0628  ALBUMIN 3.4* 3.2* 3.0* 2.8* 3.1*    CBC:  Recent Labs Lab 08/23/2013 1540 08/21/13 1219 08/23/13 0648  WBC 8.8 11.3* 11.7*  HGB 9.7* 8.8* 8.9*  HCT 31.3* 28.0* 27.9*  MCV 86.5 86.2 86.1  PLT 226 192 172   BNP (last 3 results)  Recent Labs  08-23-13 1609  PROBNP 22793.0*   CBG:  Recent Labs Lab 08/22/13 1623 08/22/13 2112 08/23/13 0603 08/23/13 1128 08/23/13 1615  GLUCAP 109* 160* 139* 98 204*    No results found for this or any previous visit (from the past 240 hour(s)).   Studies: No results found.  Scheduled Meds: . antiseptic oral rinse  15 mL Mouth Rinse BID  . aspirin  81 mg Oral Daily  . atorvastatin  40 mg Oral q1800  . calcitRIOL  0.25 mcg Oral Once per day on Mon Wed Fri  . calcium acetate  1,334 mg Oral TID WC  . carvedilol  3.125 mg Oral BID WC  . citalopram  10 mg Oral Daily  . [START ON 09/03/2013] darbepoetin (ARANESP) injection - NON-DIALYSIS  100 mcg Subcutaneous Q Wed-1800  . glimepiride  0.5 mg Oral Q breakfast  . heparin  5,000 Units Subcutaneous 3 times per day  . [START ON 08/08/2013] heparin subcutaneous  5,000 Units Subcutaneous 3 times per day  . insulin aspart  0-9 Units Subcutaneous TID WC  . loratadine  10 mg Oral Daily  . sodium chloride  3 mL Intravenous Q12H  . [START ON 08/10/2013] vancomycin  1,000 mg Intravenous To OR   Continuous Infusions: . sodium chloride 10 mL/hr (08/10/2013 0959)   Time < 30 minutes  Mallory Schaad  [facility] Pager [phone] If 8PM-8AM, please contact night-coverage at [email or web address], password [facility] 08/23/2013, 4:39 PM  LOS: 14 days

## 2013-08-23 NOTE — Procedures (Signed)
Patient was seen on dialysis and the procedure was supervised. BFR 300 Via RIJ PC BP is 128/68.  Patient appears to be tolerating treatment well

## 2013-08-23 NOTE — Progress Notes (Signed)
1045 Back to room via bed with Angelito Kiang,rn

## 2013-08-23 NOTE — Progress Notes (Signed)
Patient ID: Veronica Copeland, female   DOB: 1945/09/29, 68 y.o.   MRN: [id number]  [facility] Progress Note    Subjective:   Feels well, seen on HD   Objective:   BP 128/68  Pulse 90  Temp(Src) 98.7 F (37.1 C) (Oral)  Resp 18  Ht 5' 3.75" (1.619 m)  Wt 53.1 kg (117 lb 1 oz)  BMI 20.26 kg/m2  SpO2 99%  Intake/Output: I/O last 3 completed shifts: In: 723 [P.O.:720; I.V.:3] Out: 350 [Urine:350]   Intake/Output this shift:    Weight change: 1.752 kg (3 lb 13.8 oz)  Physical Exam: Gen:WD WN AAF in NAD CVS:no rub Resp:cta IOE:VOJJKK Ext:no edema  Labs: BMET  Recent Labs Lab 08/17/13 1630 08/18/13 0347 08/21/2013 0712 08/21/2013 0435 08-21-2013 1540 08/21/13 0514 08/22/13 0440 08/23/13 0628  NA 136* 134* 132* 137  --  136* 137 129*  K 4.7 4.3 4.5 4.7  --  4.8 4.4 5.1  CL 89* 89* 86* 94*  --  95* 97 89*  CO2 28 26 28 29   --  25 25 24   GLUCOSE 174* 188* 184* 134*  --  141* 130* 160*  BUN 98* 110* 127* 74*  --  83* 42* 57*  CREATININE 6.50* 6.53* 6.70* 4.84* 5.14* 5.95* 5.10* 6.61*  ALBUMIN 3.1* 3.0* 3.4* 3.2*  --  3.0* 2.8* 3.1*  CALCIUM 9.6 9.2 10.0 9.0  --  8.9 8.8 9.6  PHOS 7.1* 7.0* 6.9* 5.6*  --  6.0* 3.8 4.3   CBC  Recent Labs Lab 08-21-13 1540 08/21/13 1219 08/23/13 0648  WBC 8.8 11.3* 11.7*  HGB 9.7* 8.8* 8.9*  HCT 31.3* 28.0* 27.9*  MCV 86.5 86.2 86.1  PLT 226 192 172    @IMGRELPRIORS @ Medications:    . alteplase (TPA-ACTIVASE) *DIALYSIS CATH* 5 mg infusion  5 mg Intravenous Once  . antiseptic oral rinse  15 mL Mouth Rinse BID  . aspirin  81 mg Oral Daily  . atorvastatin  40 mg Oral q1800  . calcitRIOL  0.25 mcg Oral Once per day on Mon Wed Fri  . calcium acetate  1,334 mg Oral TID WC  . carvedilol  3.125 mg Oral BID WC  . citalopram  10 mg Oral Daily  . [START ON 08/13/2013] darbepoetin (ARANESP) injection - NON-DIALYSIS  100 mcg Subcutaneous Q Wed-1800  . glimepiride  0.5 mg Oral Q breakfast  . heparin  5,000 Units  Subcutaneous 3 times per day  . insulin aspart  0-9 Units Subcutaneous TID WC  . loratadine  10 mg Oral Daily  . sodium chloride  3 mL Intravenous Q12H    Assessment/ Plan:   1. CHF/Cardio-renal syndrome- EF 20-25%,  improved volume status with HD 2. ESRD: awaiting SNF placement so outpt HD can be arranged thru [facility]  3. Anemia:cont with aranesp 4. CAD- stable 5. Nutrition:encourage increase protein intake 6. Hypertension:stable 7. Vascular access: for AVF placment tomorrow per [facility].  Was interested in PD, however this will need to be pursued as an outpt as SNF cannot facilitate PD. 8. Dispo- as above, awaiting SNF placement  Thanos Cousineau A 08/23/2013, 8:39 AM

## 2013-08-23 NOTE — Progress Notes (Signed)
   VASCULAR PROGRESS NOTE  SUBJECTIVE: No specific complaints.  PHYSICAL EXAM: Filed Vitals:   08/23/13 0703 08/23/13 0722 08/23/13 0727 08/23/13 0730  BP: 136/69 128/62 124/67 128/68  Pulse: 94 89 89 90  Temp: 98.7 F (37.1 C)     TempSrc: Oral     Resp: 18 18 16 18   Height:      Weight: 117 lb 1 oz (53.1 kg)     SpO2: 99%      Palpable left radial pulse.  LABS: Lab Results  Component Value Date   WBC 11.7* 08/23/2013   HGB 8.9* 08/23/2013   HCT 27.9* 08/23/2013   MCV 86.1 08/23/2013   PLT 172 08/23/2013   Lab Results  Component Value Date   CREATININE 6.61* 08/23/2013   Lab Results  Component Value Date   INR 0.98 08/19/2013   CBG (last 3)   Recent Labs  08/22/13 1623 08/22/13 2112 08/23/13 0603  GLUCAP 109* 160* 139*    Principal Problem:   Respiratory failure, acute Active Problems:   HYPERTENSION   Atrial fibrillation   Type II or unspecified type diabetes mellitus without mention of complication, uncontrolled   CHF (congestive heart failure)   Acute combined systolic and diastolic congestive heart failure   AKI (acute kidney injury)   Leukocytosis, unspecified   Cardiorenal syndrome with renal failure   Biventricular heart failure, NYHA class 3   Chronic kidney disease (CKD), stage IV (severe)   ASSESSMENT AND PLAN:  * Plan placement of left AV fistula or AV graft tomorrow. Discussed with patient. All of her questions were answered.  Gae Gallop Beeper: [phone] 08/23/2013

## 2013-08-24 ENCOUNTER — Encounter (HOSPITAL_COMMUNITY): Admission: EM | Disposition: E | Payer: Self-pay | Source: Home / Self Care | Attending: Internal Medicine

## 2013-08-24 ENCOUNTER — Encounter (HOSPITAL_COMMUNITY): Payer: Medicare Other | Admitting: Anesthesiology

## 2013-08-24 ENCOUNTER — Inpatient Hospital Stay (HOSPITAL_COMMUNITY): Payer: Medicare Other | Admitting: Anesthesiology

## 2013-08-24 ENCOUNTER — Inpatient Hospital Stay (HOSPITAL_COMMUNITY): Payer: Medicare Other

## 2013-08-24 ENCOUNTER — Other Ambulatory Visit: Payer: Self-pay | Admitting: *Deleted

## 2013-08-24 DIAGNOSIS — I469 Cardiac arrest, cause unspecified: Secondary | ICD-10-CM

## 2013-08-24 DIAGNOSIS — N186 End stage renal disease: Secondary | ICD-10-CM

## 2013-08-24 DIAGNOSIS — Z4931 Encounter for adequacy testing for hemodialysis: Secondary | ICD-10-CM

## 2013-08-24 DIAGNOSIS — N185 Chronic kidney disease, stage 5: Secondary | ICD-10-CM

## 2013-08-24 DIAGNOSIS — J9601 Acute respiratory failure with hypoxia: Secondary | ICD-10-CM | POA: Diagnosis not present

## 2013-08-24 HISTORY — PX: AV FISTULA PLACEMENT: SHX1204

## 2013-08-24 LAB — RENAL FUNCTION PANEL
ALBUMIN: 3 g/dL — AB (ref 3.5–5.2)
BUN: 34 mg/dL — ABNORMAL HIGH (ref 6–23)
CHLORIDE: 92 meq/L — AB (ref 96–112)
CO2: 30 mEq/L (ref 19–32)
CREATININE: 4.62 mg/dL — AB (ref 0.50–1.10)
Calcium: 9.3 mg/dL (ref 8.4–10.5)
GFR calc Af Amer: 10 mL/min — ABNORMAL LOW (ref >90)
GFR calc non Af Amer: 9 mL/min — ABNORMAL LOW (ref >90)
GLUCOSE: 207 mg/dL — AB (ref 70–99)
Phosphorus: 4 mg/dL (ref 2.3–4.6)
Potassium: 4.4 mEq/L (ref 3.7–5.3)
Sodium: 135 mEq/L — ABNORMAL LOW (ref 137–147)

## 2013-08-24 LAB — GLUCOSE, CAPILLARY
GLUCOSE-CAPILLARY: 206 mg/dL — AB (ref 70–99)
Glucose-Capillary: 184 mg/dL — ABNORMAL HIGH (ref 70–99)
Glucose-Capillary: 223 mg/dL — ABNORMAL HIGH (ref 70–99)
Glucose-Capillary: 183 mg/dL — ABNORMAL HIGH (ref 70–99)

## 2013-08-24 LAB — BASIC METABOLIC PANEL
BUN: 40 mg/dL — ABNORMAL HIGH (ref 6–23)
BUN: 43 mg/dL — ABNORMAL HIGH (ref 6–23)
CHLORIDE: 92 meq/L — AB (ref 96–112)
CO2: 23 meq/L (ref 19–32)
CO2: 18 mEq/L — ABNORMAL LOW (ref 19–32)
Calcium: 9 mg/dL (ref 8.4–10.5)
Calcium: 8.5 mg/dL (ref 8.4–10.5)
Chloride: 95 mEq/L — ABNORMAL LOW (ref 96–112)
Creatinine, Ser: 5.61 mg/dL — ABNORMAL HIGH (ref 0.50–1.10)
Creatinine, Ser: 5.56 mg/dL — ABNORMAL HIGH (ref 0.50–1.10)
GFR calc Af Amer: 8 mL/min — ABNORMAL LOW (ref >90)
GFR calc Af Amer: 8 mL/min — ABNORMAL LOW (ref >90)
GFR calc non Af Amer: 7 mL/min — ABNORMAL LOW (ref >90)
GFR calc non Af Amer: 7 mL/min — ABNORMAL LOW (ref >90)
GLUCOSE: 194 mg/dL — AB (ref 70–99)
Glucose, Bld: 190 mg/dL — ABNORMAL HIGH (ref 70–99)
POTASSIUM: 5.9 meq/L — AB (ref 3.7–5.3)
Potassium: 6 mEq/L — ABNORMAL HIGH (ref 3.7–5.3)
SODIUM: 138 meq/L (ref 137–147)
Sodium: 132 mEq/L — ABNORMAL LOW (ref 137–147)

## 2013-08-24 LAB — BLOOD GAS, ARTERIAL
Acid-base deficit: 11.9 mmol/L — ABNORMAL HIGH (ref 0.0–2.0)
Bicarbonate: 13.4 mEq/L — ABNORMAL LOW (ref 20.0–24.0)
DRAWN BY: 331761
FIO2: 1 %
LHR: 14 {breaths}/min
MECHVT: 450 mL
O2 Saturation: 100 %
PCO2 ART: 29 mmHg — AB (ref 35.0–45.0)
PEEP: 5 cmH2O
Patient temperature: 98.6
TCO2: 14.3 mmol/L (ref 0–100)
pH, Arterial: 7.286 — ABNORMAL LOW (ref 7.350–7.450)
pO2, Arterial: 449 mmHg — ABNORMAL HIGH (ref 80.0–100.0)

## 2013-08-24 LAB — POCT I-STAT 3, ART BLOOD GAS (G3+)
ACID-BASE DEFICIT: 1 mmol/L (ref 0.0–2.0)
BICARBONATE: 24.3 meq/L — AB (ref 20.0–24.0)
O2 Saturation: 100 %
PH ART: 7.369 (ref 7.350–7.450)
Patient temperature: 98.4
TCO2: 26 mmol/L (ref 0–100)
pCO2 arterial: 42.1 mmHg (ref 35.0–45.0)
pO2, Arterial: 544 mmHg — ABNORMAL HIGH (ref 80.0–100.0)

## 2013-08-24 LAB — MAGNESIUM: MAGNESIUM: 2.5 mg/dL (ref 1.5–2.5)

## 2013-08-24 LAB — TROPONIN I: TROPONIN I: 2.76 ng/mL — AB (ref <0.30)

## 2013-08-24 SURGERY — ARTERIOVENOUS (AV) FISTULA CREATION
Anesthesia: Monitor Anesthesia Care | Site: Arm Upper | Laterality: Left

## 2013-08-24 MED ORDER — FENTANYL CITRATE 0.05 MG/ML IJ SOLN: 50.0000 ug | INTRAMUSCULAR | Status: DC | PRN

## 2013-08-24 MED ORDER — SODIUM CHLORIDE 0.9 % IV SOLN
0.0000 ug/h | INTRAVENOUS | Status: DC
  Administered 2013-08-24: 50 ug/h via INTRAVENOUS
  Filled 2013-08-24: qty 50

## 2013-08-24 MED ORDER — PROTAMINE SULFATE 10 MG/ML IV SOLN
INTRAVENOUS | Status: AC
  Filled 2013-08-24: qty 5

## 2013-08-24 MED ORDER — ROCURONIUM BROMIDE 50 MG/5ML IV SOLN
INTRAVENOUS | Status: AC
  Filled 2013-08-24: qty 2

## 2013-08-24 MED ORDER — NEPRO/CARBSTEADY PO LIQD
237.0000 mL | ORAL | Status: DC | PRN
  Filled 2013-08-24: qty 237

## 2013-08-24 MED ORDER — SODIUM BICARBONATE 8.4 % IV SOLN
50.0000 meq | Freq: Once | INTRAVENOUS | Status: AC
  Administered 2013-08-24: 50 meq via INTRAVENOUS
  Filled 2013-08-24: qty 50

## 2013-08-24 MED ORDER — LIDOCAINE HCL (PF) 1 % IJ SOLN
INTRAMUSCULAR | Status: AC
  Filled 2013-08-24: qty 30

## 2013-08-24 MED ORDER — SODIUM CHLORIDE 0.9 % IV SOLN: 100.0000 mL | INTRAVENOUS | Status: DC | PRN

## 2013-08-24 MED ORDER — HEPARIN SODIUM (PORCINE) 1000 UNIT/ML IJ SOLN
INTRAMUSCULAR | Status: AC
  Filled 2013-08-24: qty 1

## 2013-08-24 MED ORDER — HEPARIN SODIUM (PORCINE) 1000 UNIT/ML IJ SOLN
INTRAMUSCULAR | Status: DC | PRN
  Administered 2013-08-24: 5000 [IU] via INTRAVENOUS

## 2013-08-24 MED ORDER — ETOMIDATE 2 MG/ML IV SOLN
INTRAVENOUS | Status: AC
  Administered 2013-08-24: 20 mg
  Filled 2013-08-24: qty 20

## 2013-08-24 MED ORDER — SUCCINYLCHOLINE CHLORIDE 20 MG/ML IJ SOLN
INTRAMUSCULAR | Status: AC
  Filled 2013-08-24: qty 1

## 2013-08-24 MED ORDER — OXYCODONE HCL 5 MG/5ML PO SOLN: 5.0000 mg | Freq: Once | ORAL | Status: DC | PRN

## 2013-08-24 MED ORDER — EPINEPHRINE HCL 1 MG/ML IJ SOLN: 1.0000 mg | Freq: Once | INTRAMUSCULAR | Status: DC

## 2013-08-24 MED ORDER — PENTAFLUOROPROP-TETRAFLUOROETH EX AERO: 1.0000 "application " | INHALATION_SPRAY | CUTANEOUS | Status: DC | PRN

## 2013-08-24 MED ORDER — PANTOPRAZOLE SODIUM 40 MG IV SOLR
40.0000 mg | Freq: Every day | INTRAVENOUS | Status: DC
  Administered 2013-08-25: 40 mg via INTRAVENOUS
  Filled 2013-08-24 (×2): qty 40

## 2013-08-24 MED ORDER — HEPARIN SODIUM (PORCINE) 1000 UNIT/ML DIALYSIS
40.0000 [IU]/kg | Freq: Once | INTRAMUSCULAR | Status: AC
  Administered 2013-08-25: 2100 [IU] via INTRAVENOUS_CENTRAL

## 2013-08-24 MED ORDER — PROTAMINE SULFATE 10 MG/ML IV SOLN
INTRAVENOUS | Status: DC | PRN
  Administered 2013-08-24 (×3): 10 mg via INTRAVENOUS

## 2013-08-24 MED ORDER — FENTANYL BOLUS VIA INFUSION
25.0000 ug | INTRAVENOUS | Status: DC | PRN
  Administered 2013-08-25: 50 ug via INTRAVENOUS
  Administered 2013-08-25: 25 ug via INTRAVENOUS
  Filled 2013-08-24: qty 50

## 2013-08-24 MED ORDER — HYDROMORPHONE HCL PF 1 MG/ML IJ SOLN: 0.2500 mg | INTRAMUSCULAR | Status: DC | PRN

## 2013-08-24 MED ORDER — EPINEPHRINE HCL 0.1 MG/ML IJ SOSY
1.0000 mg | PREFILLED_SYRINGE | Freq: Once | INTRAMUSCULAR | Status: AC
  Administered 2013-08-24: 1 mg via INTRAVENOUS

## 2013-08-24 MED ORDER — FENTANYL CITRATE 0.05 MG/ML IJ SOLN
INTRAMUSCULAR | Status: AC
  Filled 2013-08-24: qty 5

## 2013-08-24 MED ORDER — ALTEPLASE 2 MG IJ SOLR
2.0000 mg | Freq: Once | INTRAMUSCULAR | Status: AC | PRN
  Filled 2013-08-24: qty 2

## 2013-08-24 MED ORDER — SODIUM CHLORIDE 0.9 % IR SOLN
Status: DC | PRN
  Administered 2013-08-24: 11:00:00

## 2013-08-24 MED ORDER — PROPOFOL 10 MG/ML IV BOLUS
INTRAVENOUS | Status: AC
  Filled 2013-08-24: qty 20

## 2013-08-24 MED ORDER — HEPARIN SODIUM (PORCINE) 1000 UNIT/ML DIALYSIS: 1000.0000 [IU] | INTRAMUSCULAR | Status: DC | PRN

## 2013-08-24 MED ORDER — PROMETHAZINE HCL 25 MG/ML IJ SOLN: 6.2500 mg | INTRAMUSCULAR | Status: DC | PRN

## 2013-08-24 MED ORDER — FENTANYL CITRATE 0.05 MG/ML IJ SOLN: 50.0000 ug | Freq: Once | INTRAMUSCULAR | Status: DC

## 2013-08-24 MED ORDER — FENTANYL CITRATE 0.05 MG/ML IJ SOLN
INTRAMUSCULAR | Status: DC | PRN
  Administered 2013-08-24: 50 ug via INTRAVENOUS

## 2013-08-24 MED ORDER — OXYCODONE HCL 5 MG PO TABS: 5.0000 mg | ORAL_TABLET | Freq: Once | ORAL | Status: DC | PRN

## 2013-08-24 MED ORDER — SODIUM BICARBONATE 8.4 % IV SOLN
INTRAVENOUS | Status: AC
  Filled 2013-08-24: qty 50

## 2013-08-24 MED ORDER — DOPAMINE-DEXTROSE 3.2-5 MG/ML-% IV SOLN
2.0000 ug/kg/min | INTRAVENOUS | Status: DC
  Administered 2013-08-24: 4 ug/kg/min via INTRAVENOUS

## 2013-08-24 MED ORDER — LIDOCAINE HCL (CARDIAC) 20 MG/ML IV SOLN
INTRAVENOUS | Status: AC
  Filled 2013-08-24: qty 5

## 2013-08-24 MED ORDER — LIDOCAINE-PRILOCAINE 2.5-2.5 % EX CREA
1.0000 | TOPICAL_CREAM | CUTANEOUS | Status: DC | PRN
Start: 2013-08-24 — End: 2013-08-25

## 2013-08-24 MED ORDER — LIDOCAINE HCL (PF) 1 % IJ SOLN
INTRAMUSCULAR | Status: DC | PRN
  Administered 2013-08-24: 30 mL

## 2013-08-24 MED ORDER — LIDOCAINE HCL (PF) 1 % IJ SOLN
5.0000 mL | INTRAMUSCULAR | Status: DC | PRN
Start: 2013-08-24 — End: 2013-08-25

## 2013-08-24 MED ORDER — SODIUM CHLORIDE 0.9 % IV SOLN
10.0000 ug/h | Freq: Once | INTRAVENOUS | Status: DC
  Filled 2013-08-24: qty 50

## 2013-08-24 MED ORDER — PROPOFOL 10 MG/ML IV EMUL
5.0000 ug/kg/min | INTRAVENOUS | Status: DC
  Filled 2013-08-24: qty 100

## 2013-08-24 MED ORDER — 0.9 % SODIUM CHLORIDE (POUR BTL) OPTIME
TOPICAL | Status: DC | PRN
  Administered 2013-08-24: 1000 mL

## 2013-08-24 SURGICAL SUPPLY — 36 items
ARMBAND PINK RESTRICT EXTREMIT (MISCELLANEOUS) ×3 IMPLANT
CANISTER SUCTION 2500CC (MISCELLANEOUS) ×3 IMPLANT
CLIP TI MEDIUM 6 (CLIP) ×3 IMPLANT
CLIP TI WIDE RED SMALL 6 (CLIP) ×6 IMPLANT
COVER PROBE W GEL 5X96 (DRAPES) IMPLANT
COVER SURGICAL LIGHT HANDLE (MISCELLANEOUS) ×3 IMPLANT
DECANTER SPIKE VIAL GLASS SM (MISCELLANEOUS) ×3 IMPLANT
DERMABOND ADVANCED (GAUZE/BANDAGES/DRESSINGS) ×2
DERMABOND ADVANCED .7 DNX12 (GAUZE/BANDAGES/DRESSINGS) ×1 IMPLANT
DRAIN PENROSE 1/2X12 LTX STRL (WOUND CARE) IMPLANT
ELECT REM PT RETURN 9FT ADLT (ELECTROSURGICAL) ×3
ELECTRODE REM PT RTRN 9FT ADLT (ELECTROSURGICAL) ×1 IMPLANT
GLOVE BIO SURGEON STRL SZ 6.5 (GLOVE) ×4 IMPLANT
GLOVE BIO SURGEON STRL SZ7.5 (GLOVE) ×3 IMPLANT
GLOVE BIO SURGEONS STRL SZ 6.5 (GLOVE) ×2
GLOVE BIOGEL PI IND STRL 7.0 (GLOVE) ×2 IMPLANT
GLOVE BIOGEL PI IND STRL 8 (GLOVE) ×1 IMPLANT
GLOVE BIOGEL PI INDICATOR 7.0 (GLOVE) ×4
GLOVE BIOGEL PI INDICATOR 8 (GLOVE) ×2
GOWN STRL REUS W/ TWL LRG LVL3 (GOWN DISPOSABLE) ×3 IMPLANT
GOWN STRL REUS W/TWL LRG LVL3 (GOWN DISPOSABLE) ×6
KIT BASIN OR (CUSTOM PROCEDURE TRAY) ×3 IMPLANT
KIT ROOM TURNOVER OR (KITS) ×3 IMPLANT
NS IRRIG 1000ML POUR BTL (IV SOLUTION) ×3 IMPLANT
PACK CV ACCESS (CUSTOM PROCEDURE TRAY) ×3 IMPLANT
PAD ARMBOARD 7.5X6 YLW CONV (MISCELLANEOUS) ×6 IMPLANT
SPONGE GAUZE 4X4 12PLY (GAUZE/BANDAGES/DRESSINGS) ×3 IMPLANT
SPONGE SURGIFOAM ABS GEL 100 (HEMOSTASIS) IMPLANT
SUT PROLENE 6 0 BV (SUTURE) ×3 IMPLANT
SUT VIC AB 3-0 SH 27 (SUTURE) ×2
SUT VIC AB 3-0 SH 27X BRD (SUTURE) ×1 IMPLANT
SUT VICRYL 4-0 PS2 18IN ABS (SUTURE) ×3 IMPLANT
TOWEL OR 17X24 6PK STRL BLUE (TOWEL DISPOSABLE) ×3 IMPLANT
TOWEL OR 17X26 10 PK STRL BLUE (TOWEL DISPOSABLE) ×3 IMPLANT
UNDERPAD 30X30 INCONTINENT (UNDERPADS AND DIAPERS) ×3 IMPLANT
WATER STERILE IRR 1000ML POUR (IV SOLUTION) ×3 IMPLANT

## 2013-08-24 NOTE — Progress Notes (Signed)
[facility] Progress Note Patient Name: Veronica Copeland DOB: 1946/02/20 MRN: [id number]  Date of Service  08/21/2013   HPI/Events of Note   Improved mental status, ABG OK, work of breathing OK  eICU Interventions  Called RT to d/c BIPAP   Intervention Category Major Interventions: Respiratory failure - evaluation and management  MCQUAID, DOUGLAS 08/08/2013, 7:26 PM

## 2013-08-24 NOTE — Discharge Instructions (Signed)
° ° °  Aug 31, 2013 Veronica Copeland [id number] August 20, 1945  Surgeon(s): Angelia Mould, MD  Procedure(s): ARTERIOVENOUS (AV) FISTULA CREATION-LEFT   x Do not stick graft for 12 weeks

## 2013-08-24 NOTE — Procedures (Signed)
Intubation Procedure Note Veronica Copeland [id number] Jun 23, 1946  Procedure: Intubation Indications: Respiratory insufficiency  Procedure Details Consent: Unable to obtain consent because of emergent medical necessity. Time Out: Verified patient identification, verified procedure, site/side was marked, verified correct patient position, special equipment/implants available, medications/allergies/relevent history reviewed, required imaging and test results available.  Performed  Maximum sterile technique was used including antiseptics, cap, gloves, hand hygiene and mask.  MAC and 3  VERY ANTERIOR Difficult airway   Evaluation Hemodynamic Status: BP stable throughout; O2 sats: stable throughout Patient's Current Condition: stable Complications: No apparent complications Patient did tolerate procedure well. Chest X-ray ordered to verify placement.  CXR: tube position acceptable.   BABCOCK,PETE 08/21/2013  I assisted this procedure. Asencion Noble

## 2013-08-24 NOTE — Progress Notes (Signed)
1621 Pt was found very lethargic  Difficult to arouse . with agonal breathing . RRt called and Code blue initiated . Dr Dyann Kief in attendance. Pt responsive to tactile stimuli . Moans and groan at times . AV graft  Positive pulses confirmed by doppler 1645  Transferred to [facility] via bed with on NRB .

## 2013-08-24 NOTE — Code Documentation (Signed)
PATIENT NAME: ZELENE BARGA MEDICAL RECORD NUMBER: [id number] Birthday: 1946-06-16  Age: 68 y.o. Admit Date: 08/20/2013  Provider: PWright  Indication: bradycardia then PEA arrest  Technical Description:   CPR performance duration: 59min  Was defibrillation or cardioversion used ? no  Was external pacer placed ? no  Was patient intubated pre/post CPR ? Post was intubated  Was transvenous pacer placed ? no  Medications Administered Include      Yes/no Amiodarone no  Atropin no  Calcium no  Epinephrine yes  Lidocaine no  Magnesium no  Norepinephrine no  Phenylephrine no  Sodium bicarbonate no  Vasopression no   Evaluation  Final Status - Was patient successfully resuscitated ? yes  If successfully resuscitated - what is current rhythm ? Sinus tachy If successfully resuscitated - what is current hemodynamic status ? Bp better on DA  Miscellaneous Information Trop 2.74 suspect ongoing ischemia, was seen by cards previously and cath showed multivessel dz. Plan was medical Rx  Mariel Sleet 2/17/20159:08 PM

## 2013-08-24 NOTE — Progress Notes (Signed)
Report given to RN [phone]

## 2013-08-24 NOTE — Progress Notes (Signed)
Chaplain responded to code.  Chaplain waited outside pt's room while medical team was with her.  RN on 3E reported to chaplain that no family is present.   08/29/2013 1600  Clinical Encounter Type  Visited With Patient not available  Visit Type Code    Estelle June, chaplain pager [phone]

## 2013-08-24 NOTE — Progress Notes (Signed)
[facility] PROGRESS NOTE Interim discharge summary 68 y.o. female hx of CAD s/p CABG, DVT not on coumadin, DM, who presented to [facility] ED with main concern of several days duration of progressively worsening shortness of breath, associated with neck pain, woke her up from sleep one night prior to this admission, requiring use of 2 pillows to improve dyspnea. This has also been associated with non productive cough, poor oral intake, malaise. Pt denies chest pain, no specific focal neurological symptoms, no fevers, chills, no recent sick contacts or exposures. She explains she has noted LE swelling worse in the LLE.  In ED, pt is hemodynamically stable, BNP > 22,000, BP 190/70. Patient failed to respond to diuresis and declare herself ESRD , no HD. Had L/RHC (see results below) after been started on HD and plan is for medical management, cardiology on board. Patient had on 2/17 AVF placement on her LUE and after procedure developed resp failure with transient apnea and bradycardia, most likely due to  Anesthesia. Was transferred to stepdown.   Filed Weights   08/23/13 1022 08/08/2013 0708 08/22/2013 1655  Weight: 53.1 kg (117 lb 1 oz) 53.3 kg (117 lb 8.1 oz) 53 kg (116 lb 13.5 oz)        Intake/Output Summary (Last 24 hours) at 08/10/2013 1708 Last data filed at 08/12/2013 1100  Gross per 24 hour  Intake    660 ml  Output      0 ml  Net    660 ml    Assessment/Plan: Acute respiratory failure due to  Acute combined systolic and diastolic congestive heart failure and right sided PNA: - Started on HD (2/12) -status post diatek cath revision; will follow renal service for further HD treatements - no JVD or crackles on exam. -Cont metoprolol, hydralazine and imdur(discontinued due to low BP)  -Finish course of antibiotics on 2.7.2014. -AV fistula LUE placed on 2/17 - Appreciate advance heart failure team and nephrology assitance. -s/p R/LHC cath (see results below); plan is for medical management  for now.  -after procedure patient very lethargic and developed acute resp failure with transient apnea and bradycardia; code BLUE activated. Appears to be related to anesthesia given. Received 1 amp of atropine and was bag for respiratory support; patient responded to tx and was able to even follow commands and protect airways prior to transfer. -transferred to 2H14, place on BIPAP; follow ABG (initial one 7.08/24/45/8), concerns for mixed blood. Repeated ABG pending -CCM consulted. Good BP and HR now.  Cardiorenal syndrome CKD V: Now end-stage renal disease -renal on board -patient responding and tolerating HD well -started on HD 2/12;  second [place] on 2/14; third treatment on 2/16 -s/p 2/17 fistula placement for long-term access -Will follow recommendations. -needs clipping -currently on M-W-F schedule   Type II or unspecified type diabetes mellitus without mention of complication, uncontrolled - HbgA1c 7.4, cont SSI and glipizide. - stable  HYPERTENSION -BP soft but overall stable. Patient denies lightheadedness or dizziness -imdur and hydralazine now discontinue to facilitate HD tx's -continue coreg (but will resume in am, given bradycardic episode today)  HLD -continue statins   Physical decondition: -will need SNF at discharge for physical rehabilitation -SW on board   Code Status: Full  Family Communication: Pt at bedside. Disposition Plan: SNF for rehab; once HD/clipping process initiaded   Consultants:  Advance heart failure  Renal Service  VVS  PCCM  Procedures: -ECHO 2.9.9242: Systolic function was severely reduced. The estimated ejection fraction was in the  range of 20% to 25%. Doppler parameters are consistent with a restrictive pattern, indicative of decreased left ventricular diastolic compliance and/or increased left atrial pressure (grade 3 diastolic dysfunction). Doppler parameters are consistent with both elevated ventricular end-diastolic filling  pressure and elevated left atrial filling pressure.  -right and left heart catheter (2/13) 1. Severe 3V CAD  2. LIMA patent  3. SVG to RCA - occluded  4. Normal right heart cath  5. Ischemic CM with EF 40% by cath. 20-25% by echo. Plan is to repeat 2-D echo in couple months and evaluate at that time needs for ICD  -Left upper ext AVF (2/17)  Antibiotics:  None  HPI/Subjective: No CP, no SOB. Tolerating HD w/o problem  Objective: Filed Vitals:   08/29/2013 1619 08/15/2013 1625 08/21/2013 1630 08/14/2013 1655  BP: 120/64 151/60 129/64   Pulse: 42 75 82   Temp:      TempSrc:      Resp:      Height:    5\' 3"  (1.6 m)  Weight:    53 kg (116 lb 13.5 oz)  SpO2:         Exam: General: Alert, awake, oriented x3, now with mild resp distress.  HEENT: No bruits, no goiter. No JVD Heart: Regular rate and rhythm, without murmurs, rubs, gallops.  Lungs: Good air movement, clear to auscultation. Abdomen: Soft, nontender, nondistended, positive bowel sounds.   Data Reviewed: Basic Metabolic Panel:  Recent Labs Lab 08/11/2013 0435 08/21/2013 1540 08/21/13 0514 08/22/13 0440 08/23/13 0628 08/28/2013 0427  NA 137  --  136* 137 129* 135*  K 4.7  --  4.8 4.4 5.1 4.4  CL 94*  --  95* 97 89* 92*  CO2 29  --  25 25 24 30   GLUCOSE 134*  --  141* 130* 160* 207*  BUN 74*  --  83* 42* 57* 34*  CREATININE 4.84* 5.14* 5.95* 5.10* 6.61* 4.62*  CALCIUM 9.0  --  8.9 8.8 9.6 9.3  PHOS 5.6*  --  6.0* 3.8 4.3 4.0   Liver Function Tests:  Recent Labs Lab 08/31/2013 0435 08/21/13 0514 08/22/13 0440 08/23/13 0628 08/09/2013 0427  ALBUMIN 3.2* 3.0* 2.8* 3.1* 3.0*   CBC:  Recent Labs Lab 08/19/2013 1540 08/21/13 1219 08/23/13 0648  WBC 8.8 11.3* 11.7*  HGB 9.7* 8.8* 8.9*  HCT 31.3* 28.0* 27.9*  MCV 86.5 86.2 86.1  PLT 226 192 172   BNP (last 3 results)  Recent Labs  08/26/2013 1609  PROBNP 22793.0*   CBG:  Recent Labs Lab 08/23/13 1615 08/23/13 2127 08/26/2013 0631 09/04/2013 1109  08/24/13 1357  GLUCAP 204* 244* 184* 206* 223*    Studies: No results found.  Scheduled Meds: . antiseptic oral rinse  15 mL Mouth Rinse BID  . aspirin  81 mg Oral Daily  . atorvastatin  40 mg Oral q1800  . calcitRIOL  0.25 mcg Oral Once per day on Mon Wed Fri  . calcium acetate  1,334 mg Oral TID WC  . carvedilol  3.125 mg Oral BID WC  . citalopram  10 mg Oral Daily  . [START ON 22-Sep-2013] darbepoetin (ARANESP) injection - NON-DIALYSIS  100 mcg Subcutaneous Q Wed-1800  . glimepiride  0.5 mg Oral Q breakfast  . heparin subcutaneous  5,000 Units Subcutaneous 3 times per day  . insulin aspart  0-9 Units Subcutaneous TID WC  . loratadine  10 mg Oral Daily  . sodium chloride  3 mL Intravenous Q12H   Continuous  Infusions: . sodium chloride 10 mL/hr (08/17/2013 0959)   Time < 30 minutes  Taden Witter  [facility] Pager [phone] If 8PM-8AM, please contact night-coverage at [email or web address], password [facility] 08/26/2013, 5:08 PM  LOS: 15 days

## 2013-08-24 NOTE — Op Note (Signed)
    NAME: Veronica Copeland    MRN: [id number] DOB: 1945/07/29    DATE OF OPERATION: 08/11/2013  PREOP DIAGNOSIS: Stage V chronic kidney disease  POSTOP DIAGNOSIS: Same  PROCEDURE: left brachiocephalic AV fistula  SURGEON: Judeth Cornfield. Scot Dock, MD, FACS  ASSIST: Leontine Locket, PA  ANESTHESIA: local with sedation   EBL: minimal  INDICATIONS: Veronica Copeland is a 68 y.o. female has a tunneled dialysis catheter. We were asked to place permanent access. She had a marginal-sized upper arm brachial vein.  FINDINGS: 4 mm upper arm cephalic vein.  TECHNIQUE: The patient was brought to the operating room and sedated by anesthesia. The left upper extremity was prepped and draped in usual sterile fashion. After the skin was anesthetized with 1% lidocaine, a transverse incision was made just above the antecubital level. There the cephalic vein was dissected free and ligated distally. It irrigated nicely with heparinized saline. The brachial artery was dissected free beneath the fascia. The patient received 4000 units of IV heparin. The brachial artery was clamped proximally and distally and a longitudinal arteriotomy was made. The vein was sewn end to side to the artery using continuous 6-0 Prolene suture. The incision was a good thrill in the fistula and a good radial and ulnar signal with the Doppler. The heparin was partially reversed with protamine. The wounds closed the deep layer 30 argon the skin closed 4-0 Vicryl. Dermabond was applied. The patient tolerated the procedure well and was transferred to the recovery room in stable condition. All needle and sponge counts were correct.  Deitra Mayo, MD, FACS [facility]  DATE OF DICTATION:   08/15/2013

## 2013-08-24 NOTE — Progress Notes (Signed)
Patient ID: Veronica Copeland, female   DOB: 10-25-1945, 68 y.o.   MRN: [id number] Cardioresp arrest.  K ^ post.  No CXR yet.  Suspect, after reviewing course of this pm, this is related to xs anesthetic vs adverse reaction to anesthesia.  ^ K probably secondary to Succinyl Choline administered.  Will plan short dialysis this pm with lowering of K and mild vol lowering, esp in setting of arrest this pm.

## 2013-08-24 NOTE — Progress Notes (Signed)
Respiratory therapy note-rapid response on 3e, patient is lethargic, placed on Bipap after initial ABG upon arrival to ICU. MD at bedside.

## 2013-08-24 NOTE — Progress Notes (Signed)
PT Cancellation Note  Patient Details Name: Veronica Copeland MRN: [id number] DOB: 07/01/1946   Cancelled Treatment:    Reason Eval/Treat Not Completed: Patient at procedure or test/unavailable.  Pt gone for AV fistula placement.     INGOLD,Shemeka Wardle 08/22/2013, 10:01 AM [facility] [phone] (pager)

## 2013-08-24 NOTE — Progress Notes (Addendum)
2250 Spoke to [place], pt's son as well as patient's other son "Chadrick" and updated them on change of status. Pt's son Dallas Breeding states he will come by in the morning.

## 2013-08-24 NOTE — Progress Notes (Signed)
The Chaplain responded to code blue page. The family of this patient did not show up tonight but one of the sons will be here to see his mom (the patient) in the morning.  Chaplain Clista Bernhardt Sherley Mckenney

## 2013-08-24 NOTE — Progress Notes (Signed)
PCCM  Pt coded, PEA arrest.   Pt with pulm edema. Pt with vent dep resp failure. Pt on DA.  Hx of ischemia and CM.  Trop 2.74 elevated.  Plan Full vent Da drip Renal called re K 6.0 Give NaHco3 HD was planned for AM 2/18  Asencion Noble

## 2013-08-24 NOTE — Consult Note (Signed)
Name: Veronica Copeland MRN: [id number] DOB: [birth year]    ADMISSION DATE:  08/14/2013 CONSULTATION DATE:  08/18/2013  REFERRING MD :  [facility] PRIMARY SERVICE: TRH  CHIEF COMPLAINT:   AMS, agonal respirs  BRIEF PATIENT DESCRIPTION:  68 y.o.F HD pt . Had graft revision this PM.  Postop on floor became agonal with respirs and trf to ICU.  Pt placed on bipap.  SIGNIFICANT EVENTS / STUDIES:  09/01/2013 Post shunt procedure agonal   LINES / TUBES: PIV Tunneled HD Catheter  CULTURES: none  ANTIBIOTICS: none  HISTORY OF PRESENT ILLNESS:   68 y.o.F CAD, Low EF, ESRD, PVD, adm 09/03/2013 with dyspnea, DM, neck pain.  Pt with bnp >22K.  TRH adm.  Pt seen by vasc surgery and cardiology.  Pt rx for CHF exac   Hx of cardiorenal syndrome.  She was declared ESRD this adm.  HD cath tunneled already in place   She has had several HD sessions this adm.  Note after shunt revision 08/18/2013 became lethargic and required trf to ICU and place on bipap.  TRH consulted Pccm.   PAST MEDICAL HISTORY :  Past Medical History  Diagnosis Date  . CAD (coronary artery disease)     s/p CABG  . HLD (hyperlipidemia)   . HTN (hypertension)   . Acute MI   . Lung nodule   . Anemia   . Allergic rhinitis   . Osteopenia   . Vaginitis   . Ganglion of joint   . Depression   . DVT (deep venous thrombosis)   . Paraplegia 2010    "used walker since they took vein out of LLE for CABG" (08/21/2013)  . Anemia   . GERD (gastroesophageal reflux disease)   . Peripheral vascular disease 08/14/12    cancellrd scheduled appointment with Dr. Quay Burow  . Pneumonia     "once" (08/30/2013)  . Shortness of breath     "all the time today" (08/15/2013)  . Type II diabetes mellitus   . Stroke 2010  . Chronic kidney disease     "think dialysis is in my future; don't know exactly what the problem is" (08/14/2013)   Past Surgical History  Procedure Laterality Date  . Shoulder arthroscopy w/ rotator cuff repair Right   . Cesarean  section    . Coronary artery bypass graft  03/2009  . Laminectomy  2010  . Excisional hemorrhoidectomy  1980's  . Vaginal hysterectomy    . Dilation and curettage of uterus    . Back surgery    . Cataract extraction, bilateral Bilateral   . Insertion of dialysis catheter N/A 08/13/2013    Procedure: INSERTION OF DIALYSIS CATHETER;  Surgeon: Angelia Mould, MD;  Location: [place];  Service: Vascular;  Laterality: N/A;   Prior to Admission medications   Medication Sig Start Date End Date Taking? Authorizing Provider  aspirin 81 MG tablet Take 81 mg by mouth daily.     Yes Historical Provider, MD  calcitRIOL (ROCALTROL) 0.25 MCG capsule Take 0.25 mcg by mouth 3 (three) times a week. Take 1 by mouth on Monday, Wednesday and friday   Yes Historical Provider, MD  cetirizine (ZYRTEC) 10 MG tablet Take 10 mg by mouth at bedtime.    Yes Historical Provider, MD  fluticasone (FLONASE) 50 MCG/ACT nasal spray Place 2 sprays into both nostrils daily as needed for allergies or rhinitis.   Yes Historical Provider, MD  glimepiride (AMARYL) 1 MG tablet Take 0.5 mg by  mouth daily with breakfast.   Yes Historical Provider, MD  HYDROcodone-acetaminophen (NORCO/VICODIN) 5-325 MG per tablet Take 1 tablet by mouth every 8 (eight) hours as needed for pain. 04/20/13  Yes Carmin Muskrat, MD  insulin aspart (NOVOLOG) 100 UNIT/ML injection Inject 8-10 Units into the skin 2 (two) times daily.   Yes Historical Provider, MD  losartan (COZAAR) 50 MG tablet Take 50 mg by mouth daily.   Yes Historical Provider, MD  Multiple Vitamins-Minerals (CENTRUM SILVER ADULT 50+ PO) Take 1 tablet by mouth daily.   Yes Historical Provider, MD  nitroGLYCERIN (NITROSTAT) 0.4 MG SL tablet Place 0.4 mg under the tongue every 5 (five) minutes as needed for chest pain.    Yes Historical Provider, MD  pravastatin (PRAVACHOL) 10 MG tablet Take 10 mg by mouth daily.   Yes Historical Provider, MD  SYRINGE-NEEDLE, DISP, 3 ML (TERUMO SURGUARD2  SYRINGE) 25G X 1" 3 ML MISC Inject 1 application into the skin as directed. 06/15/12  Yes Biagio Borg, MD  traMADol (ULTRAM) 50 MG tablet Take 50 mg by mouth every 6 (six) hours as needed for moderate pain.   Yes Historical Provider, MD  citalopram (CELEXA) 10 MG tablet Take 10 mg by mouth daily.    Historical Provider, MD   Allergies  Allergen Reactions  . Penicillins Other (See Comments)    Yeast     FAMILY HISTORY:  Family History  Problem Relation Age of Onset  . Diabetes    . Hypertension    . Stroke    . Coronary artery disease     SOCIAL HISTORY:  reports that she has never smoked. She has never used smokeless tobacco. She reports that she does not drink alcohol or use illicit drugs.  REVIEW OF SYSTEMS:  Not obtainable  SUBJECTIVE:   VITAL SIGNS: Temp:  [97.6 F (36.4 C)-98.7 F (37.1 C)] 97.9 F (36.6 C) (02/17 1352) Pulse Rate:  [42-89] 82 (02/17 1630) Resp:  [16-20] 18 (02/17 1352) BP: (91-151)/(44-64) 129/64 mmHg (02/17 1630) SpO2:  [99 %-100 %] 100 % (02/17 1352) Weight:  [53 kg (116 lb 13.5 oz)-53.3 kg (117 lb 8.1 oz)] 53 kg (116 lb 13.5 oz) (02/17 1655) HEMODYNAMICS: CV stable   VENTILATOR SETTINGS: On bipap   INTAKE / OUTPUT: Intake/Output     02/16 0701 - 02/17 0700 02/17 0701 - 02/18 0700   P.O. 920 30   I.V. (mL/kg)  150 (2.8)   Total Intake(mL/kg) 920 (17.3) 180 (3.4)   Urine (mL/kg/hr) 50 (0)    Total Output 50     Net +870 +180        Urine Occurrence  1 x     PHYSICAL EXAMINATION: General:  In no distress on bipap Neuro:  lethargic HEENT:  Dry mucus membranes Cardiovascular:  RRR nl s1 s2 no s3 s4 Lungs:  Equal BS Abdomen:  Soft NT Musculoskeletal:  from Skin:  clear  LABS:  CBC  Recent Labs Lab 08/14/2013 1540 08/21/13 1219 08/23/13 0648  WBC 8.8 11.3* 11.7*  HGB 9.7* 8.8* 8.9*  HCT 31.3* 28.0* 27.9*  PLT 226 192 172   Coag's  Recent Labs Lab 08/17/2013 0806  INR 0.98   BMET  Recent Labs Lab 08/22/13 0440  08/23/13 0628 08/10/2013 0427  NA 137 129* 135*  K 4.4 5.1 4.4  CL 97 89* 92*  CO2 25 24 30   BUN 42* 57* 34*  CREATININE 5.10* 6.61* 4.62*  GLUCOSE 130* 160* 207*   Electrolytes  Recent Labs Lab 08/22/13 0440 08/23/13 0628 08/13/2013 0427  CALCIUM 8.8 9.6 9.3  PHOS 3.8 4.3 4.0   Sepsis Markers No results found for this basename: LATICACIDVEN, PROCALCITON, O2SATVEN,  in the last 168 hours ABG  Recent Labs Lab 08/15/2013 1222  PHART 7.371  PCO2ART 49.4*  PO2ART 181.0*   Liver Enzymes  Recent Labs Lab 08/22/13 0440 08/23/13 0628 08/23/2013 0427  ALBUMIN 2.8* 3.1* 3.0*   Cardiac Enzymes No results found for this basename: TROPONINI, PROBNP,  in the last 168 hours Glucose  Recent Labs Lab 08/23/13 1128 08/23/13 1615 08/23/13 2127 08/31/2013 0631 08/28/2013 1109 08/15/2013 1357  GLUCAP 98 204* 244* 184* 206* 223*    Imaging No results found.   CXR: pending  ASSESSMENT / PLAN:  PULMONARY A:metabolic acidosis and hypoxemia post shunt procedure in setting of ESRD s/p HD P:   bipap support  May need more HD May need intubation  CARDIOVASCULAR A: Low EF, CAD, ICM P:  May need additional HD CV is stable at present  RENAL A:  ESRD P:   Per renal F/u labs  GASTROINTESTINAL A:  No issues P:   Keep npo  HEMATOLOGIC A:  No issues P:  monitor  INFECTIOUS A:  No issues P:   monitor  ENDOCRINE A:  DM2 with complications   P:   SSI  NEUROLOGIC A:  AMS P:   Watch for now    I have personally obtained a history, examined the patient, evaluated laboratory and imaging results, formulated the assessment and plan and placed orders. CRITICAL CARE: The patient is critically ill with multiple organ systems failure and requires high complexity decision making for assessment and support, frequent evaluation and titration of therapies, application of advanced monitoring technologies and extensive interpretation of multiple databases. Critical Care Time  devoted to patient care services described in this note is 40 minutes.   Mariel Sleet Beeper  [phone]  Cell  [phone]  If no response or cell goes to voicemail, call beeper [phone]  [facility] [place] Pager: [phone]  08/13/2013, 5:10 PM

## 2013-08-24 NOTE — Progress Notes (Signed)
PT Cancellation Note  Patient Details Name: Veronica Copeland MRN: [id number] DOB: [birth year]   Cancelled Treatment:    Reason Eval/Treat Not Completed: Fatigue/lethargy limiting ability to participate   INGOLD,Tylor Courtwright 08/29/2013, 3:01 PM  [facility] [phone] (pager)

## 2013-08-24 NOTE — H&P (View-Only) (Signed)
   VASCULAR PROGRESS NOTE  SUBJECTIVE: No specific complaints.  PHYSICAL EXAM: Filed Vitals:   08/23/13 0703 08/23/13 0722 08/23/13 0727 08/23/13 0730  BP: 136/69 128/62 124/67 128/68  Pulse: 94 89 89 90  Temp: 98.7 F (37.1 C)     TempSrc: Oral     Resp: 18 18 16 18  Height:      Weight: 117 lb 1 oz (53.1 kg)     SpO2: 99%      Palpable left radial pulse.  LABS: Lab Results  Component Value Date   WBC 11.7* 08/23/2013   HGB 8.9* 08/23/2013   HCT 27.9* 08/23/2013   MCV 86.1 08/23/2013   PLT 172 08/23/2013   Lab Results  Component Value Date   CREATININE 6.61* 08/23/2013   Lab Results  Component Value Date   INR 0.98 08/22/2013   CBG (last 3)   Recent Labs  08/22/13 1623 08/22/13 2112 08/23/13 0603  GLUCAP 109* 160* 139*    Principal Problem:   Respiratory failure, acute Active Problems:   HYPERTENSION   Atrial fibrillation   Type II or unspecified type diabetes mellitus without mention of complication, uncontrolled   CHF (congestive heart failure)   Acute combined systolic and diastolic congestive heart failure   AKI (acute kidney injury)   Leukocytosis, unspecified   Cardiorenal syndrome with renal failure   Biventricular heart failure, NYHA class 3   Chronic kidney disease (CKD), stage IV (severe)   ASSESSMENT AND PLAN:  * Plan placement of left AV fistula or AV graft tomorrow. Discussed with patient. All of her questions were answered.  Chris Carmelle Bamberg Beeper: [phone] 08/23/2013    

## 2013-08-24 NOTE — Procedures (Signed)
Central Venous Catheter Insertion Procedure Note Veronica Copeland [id number] 02/06/1946  Procedure: Insertion of Central Venous Catheter Indications: Assessment of intravascular volume, Drug and/or fluid administration and Frequent blood sampling  Procedure Details Consent: Unable to obtain consent because of emergent medical necessity. Time Out: Verified patient identification, verified procedure, site/side was marked, verified correct patient position, special equipment/implants available, medications/allergies/relevent history reviewed, required imaging and test results available.  Performed REAL TIME [place] used to ID and cannulate the vessel  Maximum sterile technique was used including antiseptics, cap, gloves, gown, hand hygiene, mask and sheet. Skin prep: Chlorhexidine; local anesthetic administered A antimicrobial bonded/coated triple lumen catheter was placed in the left internal jugular vein using the Seldinger technique.  Evaluation Blood flow good Complications: No apparent complications Patient did tolerate procedure well. Chest X-ray ordered to verify placement.  CXR: normal.  BABCOCK,PETE 08/24/2013, 9:22 PM  I was present for this procedure. Saralyn Pilar WrightMD

## 2013-08-24 NOTE — Interval H&P Note (Signed)
History and Physical Interval Note:  08/15/2013 9:26 AM  Veronica Copeland  has presented today for surgery, with the diagnosis of End Stage Renal Disease  The various methods of treatment have been discussed with the patient and family. After consideration of risks, benefits and other options for treatment, the patient has consented to  Procedure(s): ARTERIOVENOUS (AV) FISTULA CREATION-LEFT  (Left) as a surgical intervention .  [name] history has been reviewed, patient examined, no change in status, stable for surgery.  I have reviewed the patient's chart and labs.  Questions were answered to the patient's satisfaction.     DICKSON,CHRISTOPHER S

## 2013-08-24 NOTE — Progress Notes (Signed)
[facility] Progress Note Patient Name: Veronica Copeland DOB: 09/24/1945 MRN: [id number]  Date of Service  08/28/2013   HPI/Events of Note     eICU Interventions  Pantoprazole for stress ulcer prophylaxis OG tube   Intervention Category Intermediate Interventions: Best-practice therapies (e.g. DVT, beta blocker, etc.)  Khang Hannum 08/17/2013, 11:02 PM

## 2013-08-24 NOTE — Anesthesia Preprocedure Evaluation (Addendum)
Anesthesia Evaluation  Patient identified by MRN, date of birth, ID band Patient awake    Reviewed: Allergy & Precautions, H&P , NPO status , Patient's Chart, lab work & pertinent test results  History of Anesthesia Complications Negative for: history of anesthetic complications  Airway Mallampati: II TM Distance: >3 FB Neck ROM: Full    Dental  (+) Teeth Intact, Poor Dentition, Dental Advisory Given   Pulmonary shortness of breath, at rest and lying, neg sleep apnea, neg COPD + rhonchi         Cardiovascular hypertension, Pt. on medications + CAD, + Past MI, + Peripheral Vascular Disease and +CHF Rhythm:Regular Rate:Tachycardia - Systolic murmurs    Neuro/Psych PSYCHIATRIC DISORDERS Anxiety Depression CVA, No Residual Symptoms    GI/Hepatic Neg liver ROS, GERD-  ,  Endo/Other  diabetes, Type 2  Renal/GU ESRFRenal disease     Musculoskeletal   Abdominal   Peds  Hematology  (+) anemia ,   Anesthesia Other Findings   Reproductive/Obstetrics                          Anesthesia Physical  Anesthesia Plan  ASA: IV  Anesthesia Plan: MAC   Post-op Pain Management:    Induction: Intravenous  Airway Management Planned: Mask  Additional Equipment: None  Intra-op Plan:   Post-operative Plan:   Informed Consent: I have reviewed the patients History and Physical, chart, labs and discussed the procedure including the risks, benefits and alternatives for the proposed anesthesia with the patient or authorized representative who has indicated his/her understanding and acceptance.   Dental advisory given  Plan Discussed with: CRNA, Surgeon and Anesthesiologist  Anesthesia Plan Comments:        Anesthesia Quick Evaluation

## 2013-08-24 NOTE — Anesthesia Postprocedure Evaluation (Signed)
Anesthesia Post Note  Patient: Veronica Copeland  Procedure(s) Performed: Procedure(s) (LRB): ARTERIOVENOUS (AV) FISTULA CREATION-LEFT  (Left)  Anesthesia type: MAC  Patient location: PACU  Post pain: Pain level controlled  Post assessment: Patient's Cardiovascular Status Stable  Last Vitals:  Filed Vitals:   08/19/2013 1149  BP: 111/60  Pulse: 76  Temp: 36.4 C  Resp: 17    Post vital signs: Reviewed and stable  Level of consciousness: sedated  Complications: No apparent anesthesia complications

## 2013-08-24 NOTE — Progress Notes (Signed)
Noticed some swelling in left forearm just below graft incision. Area is hard and cool to touch.  Was able to doppler radial pulse, and bruit was present. Elevated extremity and will continue to monitor.

## 2013-08-24 NOTE — Transfer of Care (Signed)
Immediate Anesthesia Transfer of Care Note  Patient: Veronica Copeland  Procedure(s) Performed: Procedure(s): ARTERIOVENOUS (AV) FISTULA CREATION-LEFT  (Left)  Patient Location: PACU  Anesthesia Type:MAC  Level of Consciousness: awake, alert  and oriented  Airway & Oxygen Therapy: Patient Spontanous Breathing and Patient connected to face mask oxygen  Post-op Assessment: Report given to PACU RN and Post -op Vital signs reviewed and stable  Post vital signs: stable  Complications: No apparent anesthesia complications

## 2013-08-24 NOTE — Progress Notes (Signed)
[place] a call and spoken to [place] , pt's son . The pt's  Change of status  And need to upgrade care status.

## 2013-08-24 NOTE — Significant Event (Signed)
Rapid Response Event Note  Overview: Time Called: 1615 Arrival Time: 1618 Event Type: Respiratory  Initial Focused Assessment: RN called, patient "lethargic", "diaphoretic", "gurgly" Upon arrival patient agonally breathing weak pulse, Code Blue called  Interventions: Supported airway with ambu bag ventilation,  Chest compressions, placed on zoll pads and NS bolus via PIV Patient now with weak pulse, 1 amp Atropine given Patient HR improved to 80s, patient began breathing again and minimally responsive Transitioned to 100% NRB BP 120/60  RR24 Patient becoming more arousable ABG done Patient transported to 2H14 via bed with O2 via NRB and zoll heart monitor RN at bedside to accept patient.  Event Summary: Name of Physician Notified: Dr Dyann Kief at 1618  Name of Consulting Physician Notified: Dr Joya Gaskins at 1700  Outcome: Transferred (Comment) [phone])  Event End Time: [place]  Raliegh Ip

## 2013-08-24 NOTE — Progress Notes (Signed)
Patient ID: Veronica Copeland, female   DOB: [birth year], 68 y.o.   MRN: [id number]  [facility] Progress Note    Subjective:   Seen post-operatively and still somnolent   Objective:   BP 111/60  Pulse 76  Temp(Src) 97.6 F (36.4 C) (Oral)  Resp 17  Ht 5' 3.75" (1.619 m)  Wt 53.3 kg (117 lb 8.1 oz)  BMI 20.33 kg/m2  SpO2 99%  Intake/Output: I/O last 3 completed shifts: In: 920 [P.O.:920] Out: 200 [Urine:200]   Intake/Output this shift:  Total I/O In: 180 [P.O.:30; I.V.:150] Out: -  Weight change: -0.152 kg (-5.4 oz)  Physical Exam: Gen:WD WN AAF in NAD CVS:no rub Resp:cta HDQ:QIWLNL Ext:LUE AVF +T/B  Labs: BMET  Recent Labs Lab 08/18/13 0347 08/26/2013 0712 2013/09/02 0435 September 02, 2013 1540 08/21/13 0514 08/22/13 0440 08/23/13 0628 09/01/2013 0427  NA 134* 132* 137  --  136* 137 129* 135*  K 4.3 4.5 4.7  --  4.8 4.4 5.1 4.4  CL 89* 86* 94*  --  95* 97 89* 92*  CO2 26 28 29   --  25 25 24 30   GLUCOSE 188* 184* 134*  --  141* 130* 160* 207*  BUN 110* 127* 74*  --  83* 42* 57* 34*  CREATININE 6.53* 6.70* 4.84* 5.14* 5.95* 5.10* 6.61* 4.62*  ALBUMIN 3.0* 3.4* 3.2*  --  3.0* 2.8* 3.1* 3.0*  CALCIUM 9.2 10.0 9.0  --  8.9 8.8 9.6 9.3  PHOS 7.0* 6.9* 5.6*  --  6.0* 3.8 4.3 4.0   CBC  Recent Labs Lab 09/02/13 1540 08/21/13 1219 08/23/13 0648  WBC 8.8 11.3* 11.7*  HGB 9.7* 8.8* 8.9*  HCT 31.3* 28.0* 27.9*  MCV 86.5 86.2 86.1  PLT 226 192 172    @IMGRELPRIORS @ Medications:    . antiseptic oral rinse  15 mL Mouth Rinse BID  . aspirin  81 mg Oral Daily  . atorvastatin  40 mg Oral q1800  . calcitRIOL  0.25 mcg Oral Once per day on Mon Wed Fri  . calcium acetate  1,334 mg Oral TID WC  . carvedilol  3.125 mg Oral BID WC  . citalopram  10 mg Oral Daily  . [START ON 09/04/2013] darbepoetin (ARANESP) injection - NON-DIALYSIS  100 mcg Subcutaneous Q Wed-1800  . glimepiride  0.5 mg Oral Q breakfast  . heparin subcutaneous  5,000 Units Subcutaneous 3  times per day  . insulin aspart  0-9 Units Subcutaneous TID WC  . loratadine  10 mg Oral Daily  . propofol  5-70 mcg/kg/min Intravenous To OR  . sodium chloride  3 mL Intravenous Q12H     Assessment/ Plan:   1. CHF/Cardio-renal syndrome- EF 20-25%, improved volume status with HD 2. ESRD: awaiting SNF placement so outpt HD can be arranged thru [facility]  1. Cont with MWF schedule for now 3. Anemia:cont with aranesp 4. CAD- stable 5. Nutrition:encourage increase protein intake 6. Hypertension:stable 7. Vascular access: s/p LUE AVF placment per [facility].  1. Was interested in PD, however this will need to be pursued as an outpt as SNF cannot facilitate PD. 8. Dispo- as above, awaiting SNF placement 9.   Dandy Lazaro A 08/23/2013, 12:16 PM

## 2013-08-24 NOTE — Progress Notes (Signed)
Respiratory therapy note-ABG obtained and resulted to MD. Fio2 decreased to 50%, wean Bipap as tolerated to [place].

## 2013-08-25 ENCOUNTER — Inpatient Hospital Stay (HOSPITAL_COMMUNITY): Payer: Medicare Other

## 2013-08-25 ENCOUNTER — Telehealth: Payer: Self-pay | Admitting: Vascular Surgery

## 2013-08-25 LAB — RENAL FUNCTION PANEL
ALBUMIN: 1.5 g/dL — AB (ref 3.5–5.2)
ALBUMIN: 2.8 g/dL — AB (ref 3.5–5.2)
BUN: 13 mg/dL (ref 6–23)
BUN: 30 mg/dL — AB (ref 6–23)
CALCIUM: 4.5 mg/dL — AB (ref 8.4–10.5)
CHLORIDE: 118 meq/L — AB (ref 96–112)
CO2: 12 mEq/L — ABNORMAL LOW (ref 19–32)
CO2: 17 mEq/L — ABNORMAL LOW (ref 19–32)
CREATININE: 1.75 mg/dL — AB (ref 0.50–1.10)
CREATININE: 4.55 mg/dL — AB (ref 0.50–1.10)
Calcium: 9.1 mg/dL (ref 8.4–10.5)
Chloride: 93 mEq/L — ABNORMAL LOW (ref 96–112)
GFR calc Af Amer: 33 mL/min — ABNORMAL LOW (ref >90)
GFR calc Af Amer: 10 mL/min — ABNORMAL LOW (ref >90)
GFR calc non Af Amer: 29 mL/min — ABNORMAL LOW (ref >90)
GFR calc non Af Amer: 9 mL/min — ABNORMAL LOW (ref >90)
Glucose, Bld: 71 mg/dL (ref 70–99)
Glucose, Bld: 53 mg/dL — ABNORMAL LOW (ref 70–99)
PHOSPHORUS: 2.1 mg/dL — AB (ref 2.3–4.6)
Phosphorus: 6.7 mg/dL — ABNORMAL HIGH (ref 2.3–4.6)
Potassium: 2.2 mEq/L — CL (ref 3.7–5.3)
Potassium: 5.2 mEq/L (ref 3.7–5.3)
Sodium: 144 mEq/L (ref 137–147)
Sodium: 139 mEq/L (ref 137–147)

## 2013-08-25 LAB — CBC
HEMATOCRIT: 25.6 % — AB (ref 36.0–46.0)
HEMOGLOBIN: 8.1 g/dL — AB (ref 12.0–15.0)
MCH: 27.3 pg (ref 26.0–34.0)
MCHC: 31.6 g/dL (ref 30.0–36.0)
MCV: 86.2 fL (ref 78.0–100.0)
Platelets: 112 10*3/uL — ABNORMAL LOW (ref 150–400)
RBC: 2.97 MIL/uL — ABNORMAL LOW (ref 3.87–5.11)
RDW: 16.2 % — ABNORMAL HIGH (ref 11.5–15.5)
WBC: 18.3 10*3/uL — ABNORMAL HIGH (ref 4.0–10.5)

## 2013-08-25 LAB — POCT I-STAT, CHEM 8
BUN: 28 mg/dL — AB (ref 6–23)
CALCIUM ION: 1.22 mmol/L (ref 1.13–1.30)
CHLORIDE: 103 meq/L (ref 96–112)
CREATININE: 4.3 mg/dL — AB (ref 0.50–1.10)
GLUCOSE: 224 mg/dL — AB (ref 70–99)
HCT: 27 % — ABNORMAL LOW (ref 36.0–46.0)
HEMOGLOBIN: 9.2 g/dL — AB (ref 12.0–15.0)
Potassium: 4.2 mEq/L (ref 3.7–5.3)
Sodium: 139 mEq/L (ref 137–147)
TCO2: 14 mmol/L (ref 0–100)

## 2013-08-25 LAB — POCT I-STAT 3, ART BLOOD GAS (G3+)
Acid-base deficit: 18 mmol/L — ABNORMAL HIGH (ref 0.0–2.0)
Acid-base deficit: 11 mmol/L — ABNORMAL HIGH (ref 0.0–2.0)
BICARBONATE: 13.1 meq/L — AB (ref 20.0–24.0)
Bicarbonate: 7.8 mEq/L — ABNORMAL LOW (ref 20.0–24.0)
O2 SAT: 99 %
O2 Saturation: 78 %
PCO2 ART: 22.5 mmHg — AB (ref 35.0–45.0)
PH ART: 7.272 — AB (ref 7.350–7.450)
TCO2: 8 mmol/L (ref 0–100)
TCO2: 14 mmol/L (ref 0–100)
pCO2 arterial: 17 mmHg — CL (ref 35.0–45.0)
pH, Arterial: 7.372 (ref 7.350–7.450)
pO2, Arterial: 47 mmHg — ABNORMAL LOW (ref 80.0–100.0)
pO2, Arterial: 160 mmHg — ABNORMAL HIGH (ref 80.0–100.0)

## 2013-08-25 LAB — GLUCOSE, CAPILLARY
Glucose-Capillary: 112 mg/dL — ABNORMAL HIGH (ref 70–99)
Glucose-Capillary: 173 mg/dL — ABNORMAL HIGH (ref 70–99)

## 2013-08-25 MED ORDER — CHLORHEXIDINE GLUCONATE 0.12 % MT SOLN
15.0000 mL | Freq: Two times a day (BID) | OROMUCOSAL | Status: DC
  Administered 2013-08-25: 15 mL via OROMUCOSAL
  Filled 2013-08-25: qty 15

## 2013-08-25 MED ORDER — PHENYLEPHRINE HCL 10 MG/ML IJ SOLN
30.0000 ug/min | INTRAVENOUS | Status: DC
  Administered 2013-08-25: 40 ug/min via INTRAVENOUS
  Filled 2013-08-25: qty 4

## 2013-08-25 MED ORDER — BIOTENE DRY MOUTH MT LIQD
15.0000 mL | Freq: Four times a day (QID) | OROMUCOSAL | Status: DC
  Administered 2013-08-25: 15 mL via OROMUCOSAL

## 2013-08-25 MED ORDER — DEXTROSE 5 % IV SOLN
0.5000 ug/min | INTRAVENOUS | Status: DC
  Administered 2013-08-25: 3 ug/min via INTRAVENOUS
  Filled 2013-08-25: qty 1

## 2013-08-25 MED ORDER — SODIUM CHLORIDE 0.9 % IV BOLUS (SEPSIS)
1000.0000 mL | Freq: Once | INTRAVENOUS | Status: AC
  Administered 2013-08-25: 1000 mL via INTRAVENOUS

## 2013-08-25 MED FILL — Medication: Qty: 1 | Status: AC

## 2013-08-27 ENCOUNTER — Encounter (HOSPITAL_COMMUNITY): Payer: Self-pay | Admitting: Vascular Surgery

## 2013-09-05 NOTE — Progress Notes (Signed)
Dr. Nelda Marseille notified of hypotension despite increasing dopamine.  Orders received.  Will continue to monitor pt closely.

## 2013-09-05 NOTE — Progress Notes (Signed)
Patient ID: Veronica Copeland, female   DOB: 1946-05-24, 68 y.o.   MRN: [id number]  [facility] Progress Note    Subjective:   Events of last 24 hours noted.   Objective:   BP 82/49  Pulse 72  Temp(Src) 98.8 F (37.1 C) (Core (Comment))  Resp 16  Ht 5\' 3"  (1.6 m)  Wt 54.2 kg (119 lb 7.8 oz)  BMI 21.17 kg/m2  SpO2 95%  Intake/Output: I/O last 3 completed shifts: In: 885.3 [P.O.:510; I.V.:375.3] Out: 1600 [Other:1600]   Intake/Output this shift:  Total I/O In: 25 [I.V.:25] Out: 150 [Emesis/NG output:150] Weight change: 0.2 kg (7.1 oz)  Physical Exam: XTG:GYIRS, elderly AAF intubated and sedated CVS:no rub Resp:cta WNI:OEVOJJ Ext:no edema, LUE AVF +T/B  Labs: BMET  Recent Labs Lab 08/08/2013 0712 08/19/2013 0435  08/21/13 0514 08/22/13 0440 08/23/13 0628 09-03-2013 0427 09/03/13 1950 03-Sep-2013 2200 08/13/2013 0410  NA 132* 137  --  136* 137 129* 135* 132* 138 144  K 4.5 4.7  --  4.8 4.4 5.1 4.4 6.0* 5.9* 2.2*  CL 86* 94*  --  95* 97 89* 92* 92* 95* 118*  CO2 28 29  --  25 25 24 30 23  18* 12*  GLUCOSE 184* 134*  --  141* 130* 160* 207* 190* 194* 71  BUN 127* 74*  --  83* 42* 57* 34* 40* 43* 13  CREATININE 6.70* 4.84*  < > 5.95* 5.10* 6.61* 4.62* 5.61* 5.56* 1.75*  ALBUMIN 3.4* 3.2*  --  3.0* 2.8* 3.1* 3.0*  --   --  1.5*  CALCIUM 10.0 9.0  --  8.9 8.8 9.6 9.3 9.0 8.5 4.5*  PHOS 6.9* 5.6*  --  6.0* 3.8 4.3 4.0  --   --  2.1*  < > = values in this interval not displayed. CBC  Recent Labs Lab 08/17/2013 1540 08/21/13 1219 08/23/13 0648 September 03, 2013 2200  WBC 8.8 11.3* 11.7* 18.3*  HGB 9.7* 8.8* 8.9* 8.1*  HCT 31.3* 28.0* 27.9* 25.6*  MCV 86.5 86.2 86.1 86.2  PLT 226 192 172 112*    @IMGRELPRIORS @ Medications:    . antiseptic oral rinse  15 mL Mouth Rinse QID  . calcitRIOL  0.25 mcg Oral Once per day on Mon Wed Fri  . chlorhexidine  15 mL Mouth Rinse BID  . darbepoetin (ARANESP) injection - NON-DIALYSIS  100 mcg Subcutaneous Q Wed-1800  .  fentaNYL  50 mcg Intravenous Once  . heparin subcutaneous  5,000 Units Subcutaneous 3 times per day  . pantoprazole (PROTONIX) IV  40 mg Intravenous QHS  . sodium chloride  3 mL Intravenous Q12H     Assessment/ Plan:   1. Cardiorespiratory arrest- per PCCM notes, appears initiated with AMS and lethargy, followed by agonal breathing, then after taken off BiPap, she became unresponsive followed by  Bradycardia and then PEA.  S/p intubation and successful resuscitation.  Had HD for acute increase in K following code (was 4.4 yesterday morning). 2. Hyperkalemia- resolved after HD last night, do not replete low K from this am, will recheck now. 3. CHF/Cardio-renal syndrome- EF 20-25%, improved volume status with HD 4. ESRD: awaiting SNF placement so outpt HD can be arranged thru [facility]  1. Was on MWF schedule but s/p acute HD last night.  Will switch to TTS for now as this will likely be her outpt schedule 5. Anemia:cont with aranesp 6. CAD- s/p arrest but likely related to oversedation and resp depression 7. Nutrition:encourage increase protein intake 8. Hypertension:stable  9. Vascular access: s/p LUE AVF placment per [facility].  1. Was interested in PD, however this will need to be pursued as an outpt as SNF cannot facilitate PD. 10. Dispo- as above, awaiting SNF placement 11.   Harsh Trulock A 08/17/2013, 10:34 AM

## 2013-09-05 NOTE — Consult Note (Addendum)
Pulmonary/Critical Care Progress Note   Name: Veronica Copeland MRN: [id number] DOB: 1946-03-14    ADMISSION DATE:  08/13/2013 CONSULTATION DATE:  08/12/2013  REFERRING MD :  [facility] PRIMARY SERVICE: TRH  CHIEF COMPLAINT:   Intubated post cardiac arrest  BRIEF PATIENT DESCRIPTION:  68 y.o.F HD pt . Had graft revision this PM.  Postop on floor became agonal with respirs and trf to ICU.  Pt placed on bipap.  Failed BiPAP.  Cardiac arrest then intubated.  Post arrest severe hypotension.  SIGNIFICANT EVENTS / STUDIES:  08/13/2013 Post shunt procedure agonal  2/18 cardiac arrest, intubated, no cooling as became responsive post code.  LINES / TUBES: PIV Tunneled HD Catheter L IJ TLC 2/17>>> ET tube 2/17>>>  CULTURES: None  ANTIBIOTICS: None  HISTORY OF PRESENT ILLNESS:   69 y.o.F CAD, Low EF, ESRD, PVD, adm 08/15/2013 with dyspnea, DM, neck pain.  Pt with bnp >22K.  TRH adm.  Pt seen by vasc surgery and cardiology.  Pt rx for CHF exac   Hx of cardiorenal syndrome.  She was declared ESRD this adm.  HD cath tunneled already in place   She has had several HD sessions this adm.  Note after shunt revision 08/18/2013 became lethargic and required trf to ICU and place on bipap.  TRH consulted Pccm.   PAST MEDICAL HISTORY :  Past Medical History  Diagnosis Date  . CAD (coronary artery disease)     s/p CABG  . HLD (hyperlipidemia)   . HTN (hypertension)   . Acute MI   . Lung nodule   . Anemia   . Allergic rhinitis   . Osteopenia   . Vaginitis   . Ganglion of joint   . Depression   . DVT (deep venous thrombosis)   . Paraplegia 2010    "used walker since they took vein out of LLE for CABG" (08/08/2013)  . Anemia   . GERD (gastroesophageal reflux disease)   . Peripheral vascular disease 08/14/12    cancellrd scheduled appointment with Dr. Quay Burow  . Pneumonia     "once" (08/28/2013)  . Shortness of breath     "all the time today" (09/04/2013)  . Type II diabetes mellitus   . Stroke 2010   . Chronic kidney disease     "think dialysis is in my future; don't know exactly what the problem is" (08/12/2013)   Past Surgical History  Procedure Laterality Date  . Shoulder arthroscopy w/ rotator cuff repair Right   . Cesarean section    . Coronary artery bypass graft  03/2009  . Laminectomy  2010  . Excisional hemorrhoidectomy  1980's  . Vaginal hysterectomy    . Dilation and curettage of uterus    . Back surgery    . Cataract extraction, bilateral Bilateral   . Insertion of dialysis catheter N/A 09-04-2013    Procedure: INSERTION OF DIALYSIS CATHETER;  Surgeon: Angelia Mould, MD;  Location: [facility];  Service: Vascular;  Laterality: N/A;   Prior to Admission medications   Medication Sig Start Date End Date Taking? Authorizing Provider  aspirin 81 MG tablet Take 81 mg by mouth daily.     Yes Historical Provider, MD  calcitRIOL (ROCALTROL) 0.25 MCG capsule Take 0.25 mcg by mouth 3 (three) times a week. Take 1 by mouth on Monday, Wednesday and friday   Yes Historical Provider, MD  cetirizine (ZYRTEC) 10 MG tablet Take 10 mg by mouth at bedtime.    Yes Historical Provider, MD  fluticasone (FLONASE) 50 MCG/ACT nasal spray Place 2 sprays into both nostrils daily as needed for allergies or rhinitis.   Yes Historical Provider, MD  glimepiride (AMARYL) 1 MG tablet Take 0.5 mg by mouth daily with breakfast.   Yes Historical Provider, MD  HYDROcodone-acetaminophen (NORCO/VICODIN) 5-325 MG per tablet Take 1 tablet by mouth every 8 (eight) hours as needed for pain. 04/20/13  Yes Carmin Muskrat, MD  insulin aspart (NOVOLOG) 100 UNIT/ML injection Inject 8-10 Units into the skin 2 (two) times daily.   Yes Historical Provider, MD  losartan (COZAAR) 50 MG tablet Take 50 mg by mouth daily.   Yes Historical Provider, MD  Multiple Vitamins-Minerals (CENTRUM SILVER ADULT 50+ PO) Take 1 tablet by mouth daily.   Yes Historical Provider, MD  nitroGLYCERIN (NITROSTAT) 0.4 MG SL tablet Place 0.4 mg under  the tongue every 5 (five) minutes as needed for chest pain.    Yes Historical Provider, MD  pravastatin (PRAVACHOL) 10 MG tablet Take 10 mg by mouth daily.   Yes Historical Provider, MD  SYRINGE-NEEDLE, DISP, 3 ML (TERUMO SURGUARD2 SYRINGE) 25G X 1" 3 ML MISC Inject 1 application into the skin as directed. 06/15/12  Yes Biagio Borg, MD  traMADol (ULTRAM) 50 MG tablet Take 50 mg by mouth every 6 (six) hours as needed for moderate pain.   Yes Historical Provider, MD  citalopram (CELEXA) 10 MG tablet Take 10 mg by mouth daily.    Historical Provider, MD   Allergies  Allergen Reactions  . Penicillins Other (See Comments)    Yeast     FAMILY HISTORY:  Family History  Problem Relation Age of Onset  . Diabetes    . Hypertension    . Stroke    . Coronary artery disease     SOCIAL HISTORY:  reports that she has never smoked. She has never used smokeless tobacco. She reports that she does not drink alcohol or use illicit drugs.  REVIEW OF SYSTEMS:  Not obtainable  SUBJECTIVE:   VITAL SIGNS: Temp:  [96.6 F (35.9 C)-98.8 F (37.1 C)] 98.1 F (36.7 C) (02/18 1119) Pulse Rate:  [25-110] 77 (02/18 1119) Resp:  [8-26] 21 (02/18 1119) BP: (51-162)/(27-98) 65/39 mmHg (02/18 1119) SpO2:  [83 %-100 %] 100 % (02/18 1119) FiO2 (%):  [30 %-100 %] 30 % (02/18 0911) Weight:  [53 kg (116 lb 13.5 oz)-54.2 kg (119 lb 7.8 oz)] 54.2 kg (119 lb 7.8 oz) (02/18 0438) HEMODYNAMICS: CV stable CVP:  [4 mmHg-9 mmHg] 5 mmHg VENTILATOR SETTINGS: On bipap Vent Mode:  [-] PRVC FiO2 (%):  [30 %-100 %] 30 % Set Rate:  [14 bmp] 14 bmp Vt Set:  [450 mL] 450 mL PEEP:  [5 cmH20] 5 cmH20 Plateau Pressure:  [17 cmH20-20 cmH20] 17 cmH20 INTAKE / OUTPUT: Intake/Output     02/17 0701 - 02/18 0700 02/18 0701 - 02/19 0700   P.O. 30    I.V. (mL/kg) 395.3 (7.3) 105 (1.9)   Total Intake(mL/kg) 425.3 (7.8) 105 (1.9)   Urine (mL/kg/hr)     Emesis/NG output  150 (0.6)   Other 1600 (1.2)    Total Output 1600 150    Net -1174.8 -45        Urine Occurrence 1 x      PHYSICAL EXAMINATION: General:  In no distress on bipap Neuro:  lethargic HEENT:  Dry mucus membranes Cardiovascular:  RRR nl s1 s2 no s3 s4 Lungs:  Equal BS Abdomen:  Soft NT Musculoskeletal:  from  Skin:  clear  LABS:  CBC  Recent Labs Lab 08/21/13 1219 08/23/13 0648 08/28/2013 2200  WBC 11.3* 11.7* 18.3*  HGB 8.8* 8.9* 8.1*  HCT 28.0* 27.9* 25.6*  PLT 192 172 112*   Coag's  Recent Labs Lab 09-Sep-2013 0806  INR 0.98   BMET  Recent Labs Lab 09/01/2013 1950 08/14/2013 2200 08/13/2013 0410  NA 132* 138 144  K 6.0* 5.9* 2.2*  CL 92* 95* 118*  CO2 23 18* 12*  BUN 40* 43* 13  CREATININE 5.61* 5.56* 1.75*  GLUCOSE 190* 194* 71   Electrolytes  Recent Labs Lab 08/23/13 0628 09/03/2013 0427 09/01/2013 1950 08/21/2013 2200 08/08/2013 0410  CALCIUM 9.6 9.3 9.0 8.5 4.5*  MG  --   --  2.5  --   --   PHOS 4.3 4.0  --   --  2.1*   Sepsis Markers No results found for this basename: LATICACIDVEN, PROCALCITON, O2SATVEN,  in the last 168 hours ABG  Recent Labs Lab 08/26/2013 1638 08/22/2013 1713 08/23/2013 2204  PHART 7.272* 7.369 7.286*  PCO2ART 17.0* 42.1 29.0*  PO2ART 47.0* 544.0* 449.0*   Liver Enzymes  Recent Labs Lab 08/23/13 0628 08/18/2013 0427 09/01/2013 0410  ALBUMIN 3.1* 3.0* 1.5*   Cardiac Enzymes  Recent Labs Lab 08/22/2013 1950  TROPONINI 2.76*   Glucose  Recent Labs Lab 08/11/2013 0631 08/10/2013 1109 08/11/2013 1357 09/02/2013 1612 09/01/2013 0011 08/12/2013 0729  GLUCAP 184* 206* 223* 183* 173* 112*    Imaging Dg Chest Port 1 View  08/26/2013   CLINICAL DATA:  Assess endotracheal tube.  EXAM: PORTABLE CHEST - 1 VIEW  COMPARISON:  Chest radiograph August 24, 2013.  FINDINGS: Endotracheal tube tip projects 3.8 cm above the carina, unchanged. Left internal jugular central venous catheter with distal tip projecting cavoatrial junction, unchanged. Tunneled dialysis catheter via right internal jugular venous  approach with distal tip projecting in mid to distal superior vena cava, unchanged. Nasogastric tube past the gastroesophageal junction, distal tip not imaged. Pacer pads overlie the patient. Multiple EKG lines overlie the patient and may obscure subtle underlying pathology.  Cardiac silhouette is upper limits of normal in size, mediastinal silhouette is nonsuspicious. No pleural effusions or focal consolidations. No pneumothorax. Soft tissue planes and included osseous structures are unchanged.  IMPRESSION: No apparent change in position of life support lines.  Borderline cardiomegaly, no acute pulmonary process.   Electronically Signed   By: Elon Alas   On: 08/19/2013 05:09   Dg Chest Port 1 View  08/26/2013   CLINICAL DATA:  Pulmonary edema, central line placement.  EXAM: PORTABLE CHEST - 1 VIEW  COMPARISON:  08/30/2013  FINDINGS: Endotracheal tube is 4 cm above the carina. Right dialysis catheter is unchanged. Left central line tip is at the cavoatrial junction. No pneumothorax.  Prior median sternotomy and CABG. Heart is borderline enlarged. No confluent opacities, effusions or edema.  IMPRESSION: Endotracheal tube 4 cm above the carina. Left central line placement without pneumothorax. No acute cardiopulmonary disease.   Electronically Signed   By: Rolm Baptise M.D.   On: 08/26/2013 21:36     CXR: pending  ASSESSMENT / PLAN:  PULMONARY A:metabolic acidosis and hypoxemia post shunt procedure in setting of ESRD s/p HD.  Now intubated post cardiac arrest. P:   - Begin PS trials, patient is alert and interactive. - Titrate O2 down.  - Will consider extubation if tolerating PS trials.  CARDIOVASCULAR A: Low EF, CAD, ICM, RBBB on EKG.  Cardiogenic shock. P:  -  BP borderline, will start low dose dopamine.  RENAL A:  ESRD and hyperphosphatemia P:   - Per renal. - F/u labs. - Replace K.  GASTROINTESTINAL A:  Moderate protein calorie malnutrition P:   - Keep  NPO.  HEMATOLOGIC A:  No issues P:  - Monitor.  INFECTIOUS A:  No issues P:   - Monitor.  ENDOCRINE A:  DM2 with complications   P:   - SSI.  NEUROLOGIC A:  AMS likely sedation related but much more alert and interactive today. P:   - Watch for now - Hold any sedation.  Son updated at length bedside.  I have personally obtained a history, examined the patient, evaluated laboratory and imaging results, formulated the assessment and plan and placed orders.  CRITICAL CARE: The patient is critically ill with multiple organ systems failure and requires high complexity decision making for assessment and support, frequent evaluation and titration of therapies, application of advanced monitoring technologies and extensive interpretation of multiple databases. Critical Care Time devoted to patient care services described in this note is 40 minutes.   Rush Farmer, M.D. [facility] Pulmonary/Critical Care Medicine. Pager: [phone]. After hours pager: [phone].

## 2013-09-05 NOTE — Progress Notes (Signed)
CRITICAL VALUE ALERT  Critical value received:  K: 2.2; Ca 4.5  Date of notification:  08/23/2013  Time of notification:  0556  Critical value read back:yes  Nurse who received alert:  Dennison Mascot  MD notified (1st page):  Warren Lacy MD  Time of first page:  0602  Responding MD:  Warren Lacy MD  Time MD responded:  [phone]

## 2013-09-05 NOTE — Progress Notes (Signed)
   VASCULAR PROGRESS NOTE  SUBJECTIVE: Events of yesterday noted. She has what sounds like a respiratory arrest yesterday at around 5 PM. Now intubated and on the ventilator.   PHYSICAL EXAM: Filed Vitals:   2013-09-10 0545 09-10-13 0600 09-10-13 0615 10-Sep-2013 0700  BP: 154/71 147/68 140/51 107/82  Pulse:   85 75  Temp:    98.8 F (37.1 C)  TempSrc:    Core (Comment)  Resp: 11 8 25 13   Height:      Weight:      SpO2:   100% 98%   She has a good thrill in her left upper arm AV fistula. Incision looks fine.  LABS: Lab Results  Component Value Date   WBC 18.3* 08/21/2013   HGB 8.1* 08/23/2013   HCT 25.6* 08/20/2013   MCV 86.2 09/03/2013   PLT 112* 08/20/2013   Lab Results  Component Value Date   CREATININE 1.75* 09/10/2013   Lab Results  Component Value Date   INR 0.98 08/26/2013   CBG (last 3)   Recent Labs  08/30/2013 1612 September 10, 2013 0011 09/10/2013 0729  GLUCAP 183* 173* 112*    Principal Problem:   Acute respiratory failure with hypoxia Active Problems:   HYPERTENSION   Atrial fibrillation   DM (diabetes mellitus), type 2 with peripheral vascular complications   CHF (congestive heart failure)   Acute combined systolic and diastolic congestive heart failure   AKI (acute kidney injury)   Leukocytosis, unspecified   Respiratory failure, acute   Cardiorenal syndrome with renal failure   Biventricular heart failure, NYHA class 3   Chronic kidney disease (CKD), stage IV (severe)   ASSESSMENT AND PLAN:  * 1 Day Post-Op s/p: Left brachiocephalic AV fistula.  *  She has a functioning tunnel dialysis catheter.   Gae Gallop Beeper: [phone]/09/10

## 2013-09-05 NOTE — Progress Notes (Signed)
Subjective:      Veronica Copeland is a 68 y.o. female hx of CAD s/p CABG (2010), CKD IV (baseline Cr 2.8/GFR 15-20), DVT not on coumadin, DM, HTN and newly diagnosed systolic HF with EF of 45% on echo and 40% by cath.   Echo 08/10/13:  LVEF 20-25% with regional wall motion abnormalities. Moderate RV dysfunction. +restrictive physiology   Started on milrinone last week with no improvement. Milrinone stopped due to worsening renal function.   Cath on 2/13 reveals:  LAD - long 60% LAD stenosis, then occluded at mid point Lcx:  Small, calcified prox 30, 95% focal mid stensis, diffuse 70-80% , several tiny OMs RCA: occluded. LIMA to LAD - patent, there is a 50% mid LAD stenosis distal to insertion SVG -RCA - occluded at origin. Medical therapy was felt to be her best option.   I was called to evaluate the patient due to PEA arrest. She had AV fistula surgery yesterday afternoon. Later in the evening, she had respiratory arrest requiring Bi PAP then intubation. She developed PEA arrest today with long CPR. She is requiring Levephed, Dopamine and Epinephrine drip .   Objective:   Weight Range:  Vital Signs:   Temp:  [96.6 F (35.9 C)-98.8 F (37.1 C)] 98.1 F (36.7 C) (02/18 1119) Pulse Rate:  [25-110] 62 (02/18 1240) Resp:  [8-26] 21 (02/18 1119) BP: (51-162)/(27-98) 90/38 mmHg (02/18 1240) SpO2:  [83 %-100 %] 100 % (02/18 1119) FiO2 (%):  [30 %-100 %] 30 % (02/18 1240) Weight:  [53 kg (116 lb 13.5 oz)-54.2 kg (119 lb 7.8 oz)] 54.2 kg (119 lb 7.8 oz) (02/18 0438) Last BM Date: 08/18/13  Weight change: Filed Weights   08/22/2013 0708 08/29/2013 1655 09/02/2013 0438  Weight: 53.3 kg (117 lb 8.1 oz) 53 kg (116 lb 13.5 oz) 54.2 kg (119 lb 7.8 oz)    Intake/Output:   Intake/Output Summary (Last 24 hours) at 08/19/2013 1304 Last data filed at 09/04/2013 1100  Gross per 24 hour  Intake 350.25 ml  Output   1750 ml  Net -1399.75 ml     Physical Exam: General: Elderly appearing.  Intubated  non responsive.  HEENT: normal  Neck: supple. JVP 6-7 with prominent CV waves . Carotids 2+ bilat; no bruits. No lymphadenopathy or thryomegaly appreciated. R upper chest dialysis catheter Cor: PMI nondisplaced. iregular rate and tachycardiac.  No rubs, gallops or murmurs.  Lungs: clear  Abdomen: soft, nontender,. No hepatosplenomegaly. No bruits or masses. Good bowel sounds.   Telemetry: Afib   Labs: Basic Metabolic Panel:  Recent Labs Lab 08/22/13 0440 08/23/13 0628 08/10/2013 0427 08/12/2013 1950 09/02/2013 2200 08/17/2013 0410 08/11/2013 1100 09/04/2013 1235  NA 137 129* 135* 132* 138 144 139 139  K 4.4 5.1 4.4 6.0* 5.9* 2.2* 5.2 4.2  CL 97 89* 92* 92* 95* 118* 93* 103  CO2 25 24 30 23  18* 12* 17*  --   GLUCOSE 130* 160* 207* 190* 194* 71 53* 224*  BUN 42* 57* 34* 40* 43* 13 30* 28*  CREATININE 5.10* 6.61* 4.62* 5.61* 5.56* 1.75* 4.55* 4.30*  CALCIUM 8.8 9.6 9.3 9.0 8.5 4.5* 9.1  --   MG  --   --   --  2.5  --   --   --   --   PHOS 3.8 4.3 4.0  --   --  2.1* 6.7*  --     Liver Function Tests:  Recent Labs Lab 08/22/13 0440 08/23/13 4098  08/29/2013 0427 08/11/2013 0410 08/31/2013 1100  ALBUMIN 2.8* 3.1* 3.0* 1.5* 2.8*   No results found for this basename: LIPASE, AMYLASE,  in the last 168 hours No results found for this basename: AMMONIA,  in the last 168 hours  CBC:  Recent Labs Lab 08/18/2013 1540 08/21/13 1219 08/23/13 0648 08/21/2013 2200 08/09/2013 1235  WBC 8.8 11.3* 11.7* 18.3*  --   HGB 9.7* 8.8* 8.9* 8.1* 9.2*  HCT 31.3* 28.0* 27.9* 25.6* 27.0*  MCV 86.5 86.2 86.1 86.2  --   PLT 226 192 172 112*  --     Cardiac Enzymes:  Recent Labs Lab 08/26/2013 1950  TROPONINI 2.76*    BNP: BNP (last 3 results)  Recent Labs  08/11/2013 1609  PROBNP 22793.0*     Other results:  Imaging: Dg Chest Port 1 View  09/01/2013   CLINICAL DATA:  Cardiac arrest.  EXAM: PORTABLE CHEST - 1 VIEW  COMPARISON:  DG CHEST 1V PORT dated 08/31/2013; DG CHEST 1V PORT dated  08/17/2013  FINDINGS: Endotracheal tube, dialysis catheter and left-sided central line shows stable positioning. The heart size is stable. There is no evidence of pulmonary edema, consolidation, pneumothorax, nodule or pleural fluid.  IMPRESSION: Stable chest x-ray.  No edema or pneumothorax identified.   Electronically Signed   By: Aletta Edouard M.D.   On: 08/08/2013 12:43   Dg Chest Port 1 View  08/13/2013   CLINICAL DATA:  Assess endotracheal tube.  EXAM: PORTABLE CHEST - 1 VIEW  COMPARISON:  Chest radiograph August 24, 2013.  FINDINGS: Endotracheal tube tip projects 3.8 cm above the carina, unchanged. Left internal jugular central venous catheter with distal tip projecting cavoatrial junction, unchanged. Tunneled dialysis catheter via right internal jugular venous approach with distal tip projecting in mid to distal superior vena cava, unchanged. Nasogastric tube past the gastroesophageal junction, distal tip not imaged. Pacer pads overlie the patient. Multiple EKG lines overlie the patient and may obscure subtle underlying pathology.  Cardiac silhouette is upper limits of normal in size, mediastinal silhouette is nonsuspicious. No pleural effusions or focal consolidations. No pneumothorax. Soft tissue planes and included osseous structures are unchanged.  IMPRESSION: No apparent change in position of life support lines.  Borderline cardiomegaly, no acute pulmonary process.   Electronically Signed   By: Elon Alas   On: 08/29/2013 05:09   Dg Chest Port 1 View  08/13/2013   CLINICAL DATA:  Pulmonary edema, central line placement.  EXAM: PORTABLE CHEST - 1 VIEW  COMPARISON:  September 13, 2013  FINDINGS: Endotracheal tube is 4 cm above the carina. Right dialysis catheter is unchanged. Left central line tip is at the cavoatrial junction. No pneumothorax.  Prior median sternotomy and CABG. Heart is borderline enlarged. No confluent opacities, effusions or edema.  IMPRESSION: Endotracheal tube 4 cm above the  carina. Left central line placement without pneumothorax. No acute cardiopulmonary disease.   Electronically Signed   By: Rolm Baptise M.D.   On: 08/13/2013 21:36     Medications:     Scheduled Medications: . antiseptic oral rinse  15 mL Mouth Rinse QID  . calcitRIOL  0.25 mcg Oral Once per day on Mon Wed Fri  . chlorhexidine  15 mL Mouth Rinse BID  . darbepoetin (ARANESP) injection - NON-DIALYSIS  100 mcg Subcutaneous Q Wed-1800  . fentaNYL  50 mcg Intravenous Once  . heparin subcutaneous  5,000 Units Subcutaneous 3 times per day  . pantoprazole (PROTONIX) IV  40 mg Intravenous QHS  . sodium  chloride  1,000 mL Intravenous Once  . sodium chloride  3 mL Intravenous Q12H    Infusions: . sodium chloride 10 mL/hr at 2013-09-09 0800  . DOPamine 8 mcg/kg/min (09-09-2013 1115)  . epinephrine    . fentaNYL infusion INTRAVENOUS Stopped (September 09, 2013 0930)  . phenylephrine (NEO-SYNEPHRINE) Adult infusion      PRN Medications: sodium chloride, sodium chloride, sodium chloride, feeding supplement (NEPRO CARB STEADY), fentaNYL, fentaNYL, fentaNYL, heparin, hydrALAZINE, lidocaine (PF), lidocaine-prilocaine, nitroGLYCERIN, ondansetron (ZOFRAN) IV, ondansetron, pentafluoroprop-tetrafluoroeth, sodium chloride  EKG: A-fib with RBBB, 1 mm inferior STE and significant anterior ST depression   Assessment:   1) PEA arrest: likely due to myocardial ischemia and decompensation. No significant hypoxia to suggest PE. No pericardial effusion by bedside echo.  EKG is significantly changed and suggestive of inferoposterior STEMI. I reviewed cardiac cath from last week. The native LCX had a 95% heavily calcified lesion which might have closed.  Unfortunately, the patient is unstable to do cardiac cath. We could not maintain reasonable BP in spite of multiple drips. Dr. Nelda Marseille  had a prolonged discussion with  The son and sister about the situation.  The patient went into PEA again and did not respond to treatment.     Plan/Discussion:

## 2013-09-05 NOTE — Procedures (Signed)
CPR Note  Called bedside.  PEA arrest.  Please see previous note and ACLS sheet for details of medications given.  Resuscitative efforts on and off for 45 minutes.  Last episode of PEA ACLS protocol was followed again but no pulse was obtained.  Patient was declared and family was notified.  Rush Farmer, M.D. [facility] Pulmonary/Critical Care Medicine. Pager: [phone]. After hours pager: [phone].

## 2013-09-05 NOTE — Progress Notes (Signed)
150cc Fentanyl drip wasted in sink with 2 RN's.

## 2013-09-05 NOTE — Clinical Social Work Placement (Addendum)
    Clinical Social Work Department CLINICAL SOCIAL WORK PLACEMENT NOTE 08/29/2013  Patient:  DAYANA, DALPORTO  Account Number:  [id number] Admit date:  09/04/2013  Clinical Social Worker:  Butch Penny Tasharra Nodine, LCSWA  Date/time:  08/12/2013 12:00 M  Clinical Social Work is seeking post-discharge placement for this patient at the following level of care:   SKILLED NURSING   (*CSW will update this form in Epic as items are completed)   08/12/2013  Patient/family provided with [place] Department of Clinical Social Work's list of facilities offering this level of care within the geographic area requested by the patient (or if unable, by the patient's family).  08/12/2013  Patient/family informed of their freedom to choose among providers that offer the needed level of care, that participate in Medicare, Medicaid or managed care program needed by the patient, have an available bed and are willing to accept the patient.  08/12/2013  Patient/family informed of [facility]' ownership interest in [facility], as well as of the fact that they are under no obligation to receive care at this facility.  PASARR submitted to EDS on 08/13/2013 PASARR number received from [employer] on 08/13/2013  FL2 transmitted to all facilities in geographic area requested by pt/family on  08/12/2013 FL2 transmitted to all facilities within larger geographic area on   Patient informed that his/her managed care company has contracts with or will negotiate with  certain facilities, including the following:   NA     Patient/family informed of bed offers received:   Patient chooses bed at  Physician recommends and patient chooses bed at    Patient to be transferred to  on   Patient to be transferred to facility by   The following physician request were entered in Epic:   Additional Comments: Patient was transferred to ICU and expired on 08/23/2013. Lorie Phenix. [place], [place]

## 2013-09-05 NOTE — Progress Notes (Signed)
Agree with interventions and note as documented by dietetic intern.  Pt currently undergoing code at time of visit.  RD will follow for ongoing plan of care.   Brynda Greathouse, MS RD LDN Clinical Inpatient Dietitian Pager: [phone] Weekend/After hours pager: [phone]

## 2013-09-05 NOTE — Progress Notes (Signed)
Inpatient Diabetes Program Recommendations  AACE/ADA: New Consensus Statement on Inpatient Glycemic Control (2013)  Target Ranges:  Prepandial:   less than 140 mg/dL      Peak postprandial:   less than 180 mg/dL (1-2 hours) Results for FRANCEEN, ERISMAN (MRN [id number]) as of 08/30/2013 09:08  Ref. Range 08/13/2013 06:31 08/22/2013 11:09 09/01/2013 13:57 08/30/2013 16:12 08/23/2013 00:11 09/02/2013 07:29  Glucose-Capillary Latest Range: 70-99 mg/dL 184 (H) 206 (H) 223 (H) 183 (H) 173 (H) 112 (H)       Critically ill patients:  140 - 180 mg/dL   Diabetes history: DM2 Outpatient Diabetes medications: Amaryl 0.5mg  QAM, Novolog 8-10 units BID Current orders for Inpatient glycemic control: CBGs Q4H but NO correction insulin ordered  Inpatient Diabetes Program Recommendations Correction (SSI): Noted CBGs ordered Q4H. Please order Novolog sensitive correction scale Q4H.  Note: Noted patient experienced PEA yesterday evening and is now intubated on ventilator. Noted CBGs ordered Q4H without any correction insulin orders.  Please order Novolog sensitive correction scale Q4H.   Thanks, Barnie Alderman, RN, MSN, CCRN Diabetes Coordinator Inpatient Diabetes Program [phone] (Team Pager) [phone] ([facility] office) [phone] [facility] office)

## 2013-09-05 NOTE — Progress Notes (Signed)
Called by bedside RN, BP worsening and dopamine is up to 20.  CVP is 7.  Will add levo and give a bolus of NS with target CVP of 10.  Shortly after called again, patient was in PEA.  See code sheet for details.  Epi given with response multiple times.  Epi drip started.  Cardiology called.  CXR with no PTX.  Echo with no pericardial effusion.  PaO2 on 30% is 160 making a PE highly unlikely.  EKG showed new ST segment depression making this likely an MI resulting in PEA arrest.  Cardiology does not feel that there is much that can be done given how unstable her BP is going to be.  Based on all that information, we spoke with son and sister.  Explained the entire condition and that there is little that can be done at this point.  While discussing goals of care the patient decompensated again and went in PEA.  Attempted resuscitative efforts again but we were unable to get a pulse back.  Patient was declared and family was notified.  Additional CC time excluding procedures 55 minutes.  Rush Farmer, M.D. [facility] Pulmonary/Critical Care Medicine. Pager: [phone]. After hours pager: [phone].

## 2013-09-05 NOTE — Progress Notes (Signed)
Time of death 54. Pronounced by Dr. Nelda Marseille.

## 2013-09-05 NOTE — Progress Notes (Signed)
RT at bedside to switch patient off BIPAP to NRB. As soon as she took pt off BIPAP, pt's "eyes rolled back" and HR fell to the 30's. Pt became unresponsive and we were unable to palpate a pulse. Pt coded, PEA arrest.  CCM notified and at bedside. See code blue sheet for further details.

## 2013-09-05 NOTE — Telephone Encounter (Addendum)
Message copied by Gena Fray on Wed Aug 25, 2013  4:26 PM ------      Message from: Mena Goes      Created: Tue Aug 24, 2013  1:09 PM      Regarding: Schedule                   ----- Message -----         From: Angelia Mould, MD         Sent: 08/29/2013  12:00 PM           To: Vvs Charge Pool      Subject: charge and f/u                                           PROCEDURE: left brachiocephalic AV fistula            SURGEON: Judeth Cornfield. Scot Dock, MD, FACS            ASSIST: Leontine Locket, PA            She needs a follow up visit in 6 weeks with a duplex of her left brachiocephalic AV fistula to check on the maturation of the fistula. Thank you. CD ------  Pt deceased, dpm

## 2013-09-05 NOTE — Progress Notes (Signed)
INITIAL NUTRITION ASSESSMENT  DOCUMENTATION CODES Per approved criteria  -Non-severe (moderate) malnutrition in the context of chronic illness   INTERVENTION:  If TF initiated, recommend Nepro at 25 ml/hr with Prostat 30 ml daily to provide 1180 kcal (100% estimated needs), 64 grams protein (99% estimated needs), and 436 grams free water.  RD to follow patient care plan and add supplements as necessary  NUTRITION DIAGNOSIS: Inadequate oral intake related to inability to eat as evidenced by NPO status.   Goal: Patient to meet >/=90% of estimated needs  Monitor:  Vent status, TF initiation/PO diet advancement, lab trends, weight trends  Reason for Assessment: New Ventilator  68 y.o. female  Admitting Dx: Acute respiratory failure with hypoxia  ASSESSMENT: 68 y/o female who was admitted on 2/2 due to progressive shortness of breath requiring use of 2 pillows to improve dyspnea. 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 Echo shows EF of 20-30%. hx of CAD s/p CABG, DVT not on coumadin, and DM. We have been asked to provide permanent access and diatek. Past medical history hypertension, hyperlipidemia, and DM. She is on stain, insulin and Asprin daily. She is on 5,000 SQ heparin TID.  Patient received low sodium diet education on 2/7. PO diet order was previously renal/carb modified with 1200 ml fluid restriction . Patient was eating well with 50-100% meal completion.  Patient started HD on 2/12  Patient  Had left brachiocephalic AV fistula placed on 09-20-22. Patient will require HD for CKD V.  After procedure on 2022/09/20 patient coded, PEA arrest. Patient intubated due to pulmonary edema and respiratory failure.   Patient's son a bedside. Patient's son stated that the patient has not had any recent weight loss except maybe some from fluid loss. Patient's son reported that the patient normally eats 2-3 meals a day, sometimes skipping  breakfast, and likes Boost.   Patient is currently intubated on ventilator support. MV:  6.3 L/min Temp (24hrs), Avg:98 F (36.7 C), Min:96.6 F (35.9 C), Max:98.8 F (37.1 C)   Nutrition Focused Physical Exam:  Subcutaneous Fat:  Orbital Region: mild depletion Upper Arm Region: moderate depletion Thoracic and Lumbar Region: moderate depletion  Muscle:  Temple Region: moderate depletion Clavicle Bone Region: severe depletion Clavicle and Acromion Bone Region: moderate depeltion Scapular Bone Region: N/A Dorsal Hand: Moderate depletion Patellar Region: mild depletion Anterior Thigh Region: WNL Posterior Calf Region: WNL  Edema: mild   Patient meets criteria for non-severe malnutrition in the context of chronic illness as evidenced by moderate body fat and severe muscle mass depletion.   Height: Ht Readings from Last 1 Encounters:  20-Sep-2013 5\' 3"  (1.6 m)    Weight: Wt Readings from Last 1 Encounters:  08/19/2013 119 lb 7.8 oz (54.2 kg)    Ideal Body Weight: 115 lb (52.3 kg)  % Ideal Body Weight: 103%  Wt Readings from Last 10 Encounters:  08/14/2013 119 lb 7.8 oz (54.2 kg)  08/21/2013 119 lb 7.8 oz (54.2 kg)  08/18/2013 119 lb 7.8 oz (54.2 kg)  08/14/2013 119 lb 7.8 oz (54.2 kg)  07/20/13 128 lb 12 oz (58.401 kg)  04/23/13 124 lb 12.8 oz (56.609 kg)  01/14/13 120 lb 2 oz (54.488 kg)  06/24/12 125 lb (56.7 kg)  12/03/11 127 lb 6 oz (57.777 kg)  06/05/11 116 lb (52.617 kg)    Usual Body Weight: 128 lb (58.2 kg)  % Usual Body Weight: 93%  BMI:  Body  mass index is 21.17 kg/(m^2).  Estimated Nutritional Needs: Kcal: 1152 Protein: 65-75 grams Fluid: 1.2 L  Skin: closed incision-neck and arm  Diet Order: NPO  EDUCATION NEEDS: -Education not appropriate at this time   Intake/Output Summary (Last 24 hours) at 08/26/2013 0920 Last data filed at 08/24/2013 0700  Gross per 24 hour  Intake 405.25 ml  Output   1600 ml  Net -1194.75 ml    Last BM:  2/11  Labs:   Recent Labs Lab 08/23/13 0628 08/12/2013 0427 08/11/2013 1950 08/29/2013 2200 08/20/2013 0410  NA 129* 135* 132* 138 144  K 5.1 4.4 6.0* 5.9* 2.2*  CL 89* 92* 92* 95* 118*  CO2 24 30 23  18* 12*  BUN 57* 34* 40* 43* 13  CREATININE 6.61* 4.62* 5.61* 5.56* 1.75*  CALCIUM 9.6 9.3 9.0 8.5 4.5*  MG  --   --  2.5  --   --   PHOS 4.3 4.0  --   --  2.1*  GLUCOSE 160* 207* 190* 194* 71    CBG (last 3)   Recent Labs  08/08/2013 1612 08/12/2013 0011 08/13/2013 0729  GLUCAP 183* 173* 112*    Scheduled Meds: . antiseptic oral rinse  15 mL Mouth Rinse QID  . calcitRIOL  0.25 mcg Oral Once per day on Mon Wed Fri  . chlorhexidine  15 mL Mouth Rinse BID  . darbepoetin (ARANESP) injection - NON-DIALYSIS  100 mcg Subcutaneous Q Wed-1800  . fentaNYL  50 mcg Intravenous Once  . heparin subcutaneous  5,000 Units Subcutaneous 3 times per day  . pantoprazole (PROTONIX) IV  40 mg Intravenous QHS  . sodium chloride  3 mL Intravenous Q12H    Continuous Infusions: . sodium chloride 20 mL/hr at 08/23/2013 2200  . DOPamine Stopped (08/20/2013 0430)  . fentaNYL infusion INTRAVENOUS 150 mcg/hr (09/01/2013 0700)    Past Medical History  Diagnosis Date  . CAD (coronary artery disease)     s/p CABG  . HLD (hyperlipidemia)   . HTN (hypertension)   . Acute MI   . Lung nodule   . Anemia   . Allergic rhinitis   . Osteopenia   . Vaginitis   . Ganglion of joint   . Depression   . DVT (deep venous thrombosis)   . Paraplegia 2010    "used walker since they took vein out of LLE for CABG" (08/18/2013)  . Anemia   . GERD (gastroesophageal reflux disease)   . Peripheral vascular disease 08/14/12    cancellrd scheduled appointment with Dr. Nanetta Batty  . Pneumonia     "once" (18-Aug-2013)  . Shortness of breath     "all the time today" (2013-08-18)  . Type II diabetes mellitus   . Stroke 2010  . Chronic kidney disease     "think dialysis is in my future; don't know exactly what the problem is"  (August 18, 2013)    Past Surgical History  Procedure Laterality Date  . Shoulder arthroscopy w/ rotator cuff repair Right   . Cesarean section    . Coronary artery bypass graft  03/2009  . Laminectomy  2010  . Excisional hemorrhoidectomy  1980's  . Vaginal hysterectomy    . Dilation and curettage of uterus    . Back surgery    . Cataract extraction, bilateral Bilateral   . Insertion of dialysis catheter N/A 08/19/2013    Procedure: INSERTION OF DIALYSIS CATHETER;  Surgeon: Chuck Hint, MD;  Location: [facility] OR;  Service: Vascular;  Laterality:  N/A;    Claudell Kyle, Dietetic Intern Pager: [phone]

## 2013-09-05 DEATH — deceased

## 2013-09-08 NOTE — Discharge Summary (Addendum)
Veronica Copeland, Veronica Copeland NO.:  [id number]  MEDICAL RECORD NO.:  [id number]  LOCATION:  [id number]                        FACILITY:  [facility]  PHYSICIAN:  Providence Lanius, MD  DATE OF BIRTH:  1946-06-06  DATE OF ADMISSION:  08/10/2013 DATE OF DISCHARGE:  08/13/2013                              DISCHARGE SUMMARY   DEATH SUMMARY:  PRIMARY DIAGNOSIS/CAUSE OF DEATH:  Pulseless electrical activity cardiac arrest.  SECONDARY DIAGNOSES:  Severe metabolic acidosis, renal failure, and respiratory failure and hyperphosphatemia.  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  She had the procedure done, was transferred to the floor where she had an episode of respiratory failure that quickly deteriorated to cardiac arrest.  The patient was brought down to the intensive care unit, and was intubated after successful resuscitation; however the following day, the patient deteriorated again and developed another episode of pulseless electrical activity cardiac arrest.  The patient was resuscitated again; however, it became evident that the patient's shock is refractory and that she is unlikely to recover from this.  While having this conversation with the family, the patient developed PEA again, and after failure to resuscitate, the patient was declared dead, and the family was informed.     Providence Lanius, MD     WJY/MEDQ  D:  09/07/2013  T:  09/08/2013  Job:  [id number]

## 2013-10-06 ENCOUNTER — Encounter: Payer: Medicare Other | Admitting: Vascular Surgery

## 2013-10-06 ENCOUNTER — Other Ambulatory Visit (HOSPITAL_COMMUNITY): Payer: Medicare Other

## 2014-01-18 ENCOUNTER — Ambulatory Visit: Payer: Medicare Other | Admitting: Internal Medicine

## 2014-06-16 ENCOUNTER — Encounter (HOSPITAL_COMMUNITY): Payer: Self-pay | Admitting: Internal Medicine

## 2014-12-30 IMAGING — CR DG CHEST 2V
2 series · 2 of 2 positions shown · non-contrast
Comparison: DG CHEST 2 VIEW dated 08/09/2013; CT CHEST W/O CM dated
09/06/2010

CLINICAL DATA: Shortness of breath, infiltrates.

EXAM:
CHEST  2 VIEW

[w chest lat]
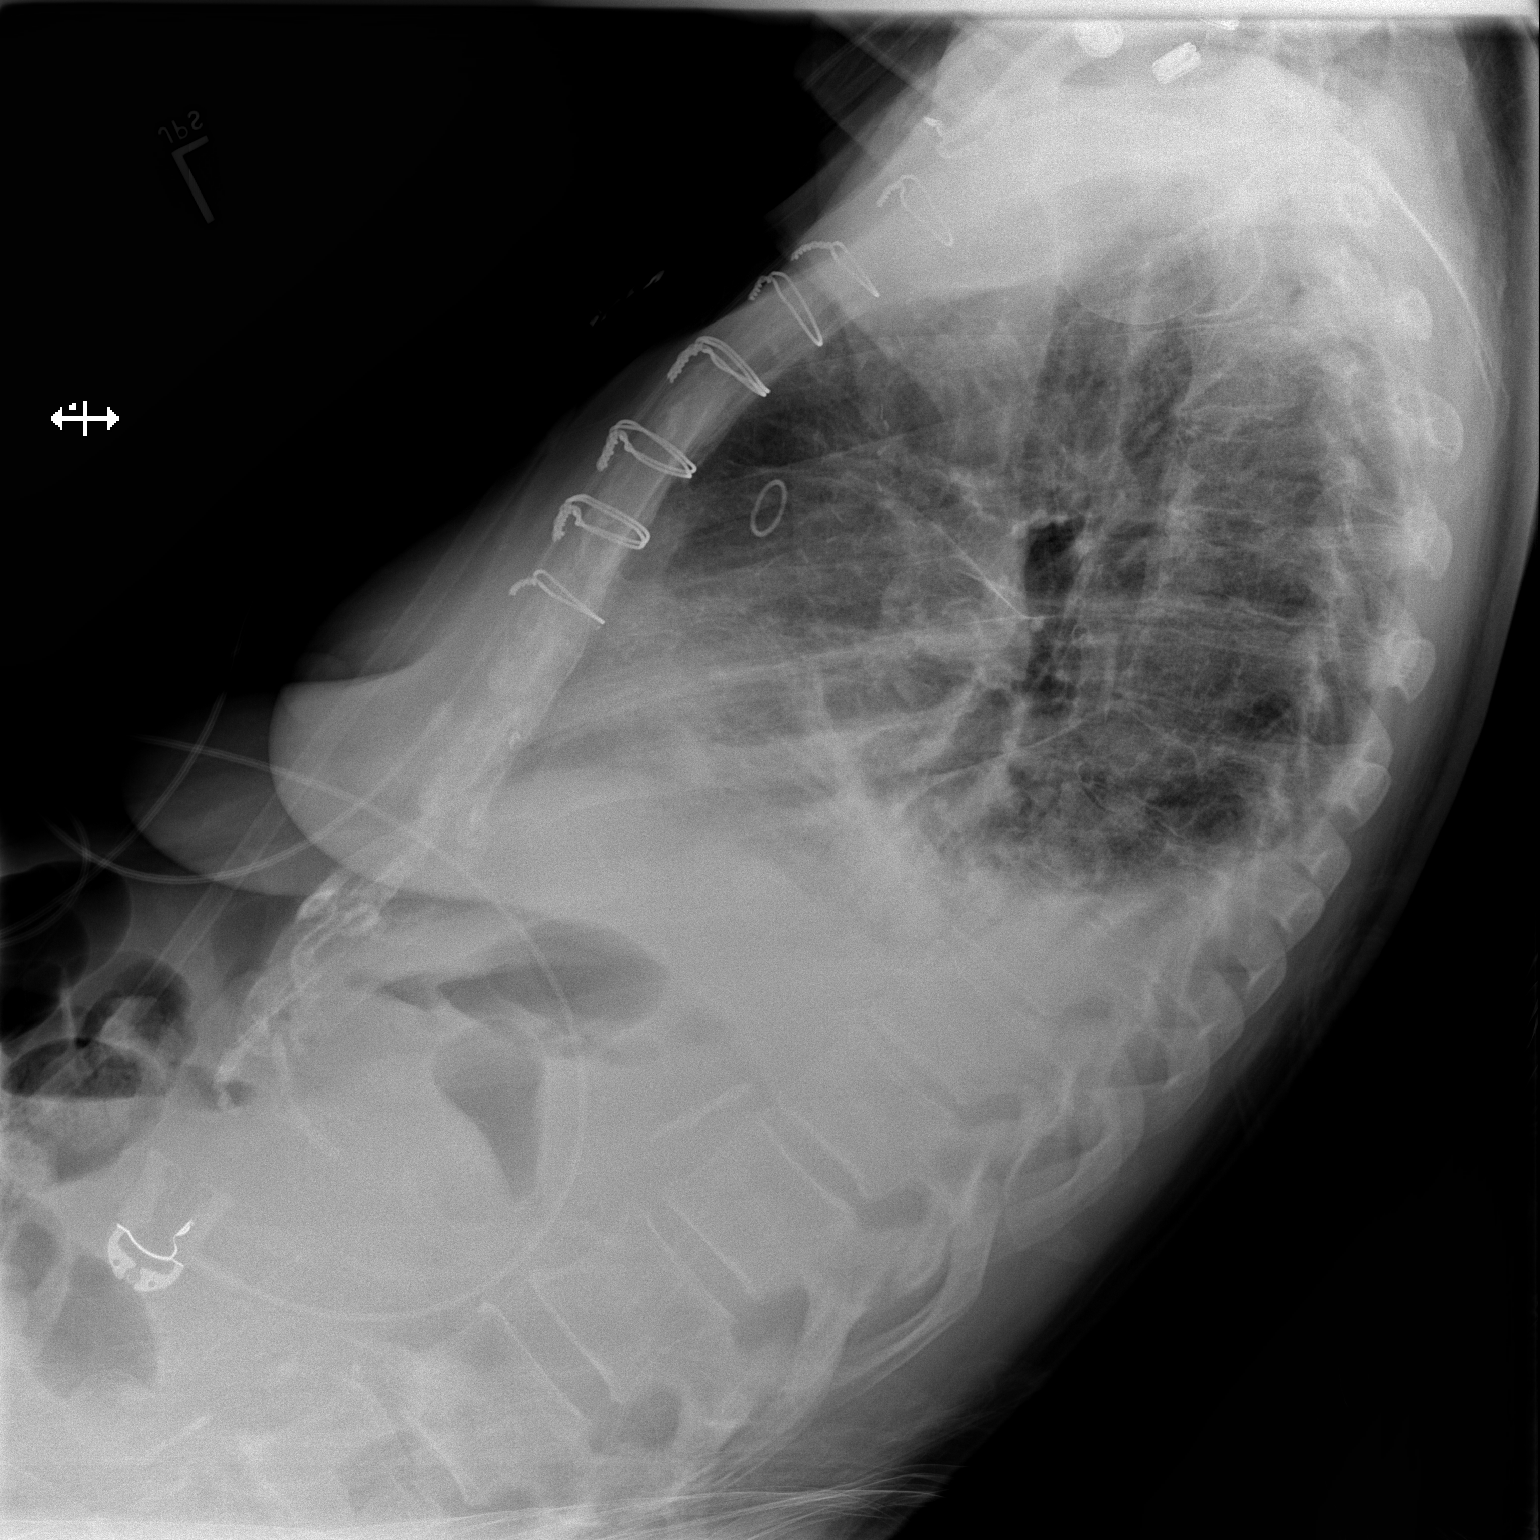

[x chest ap]
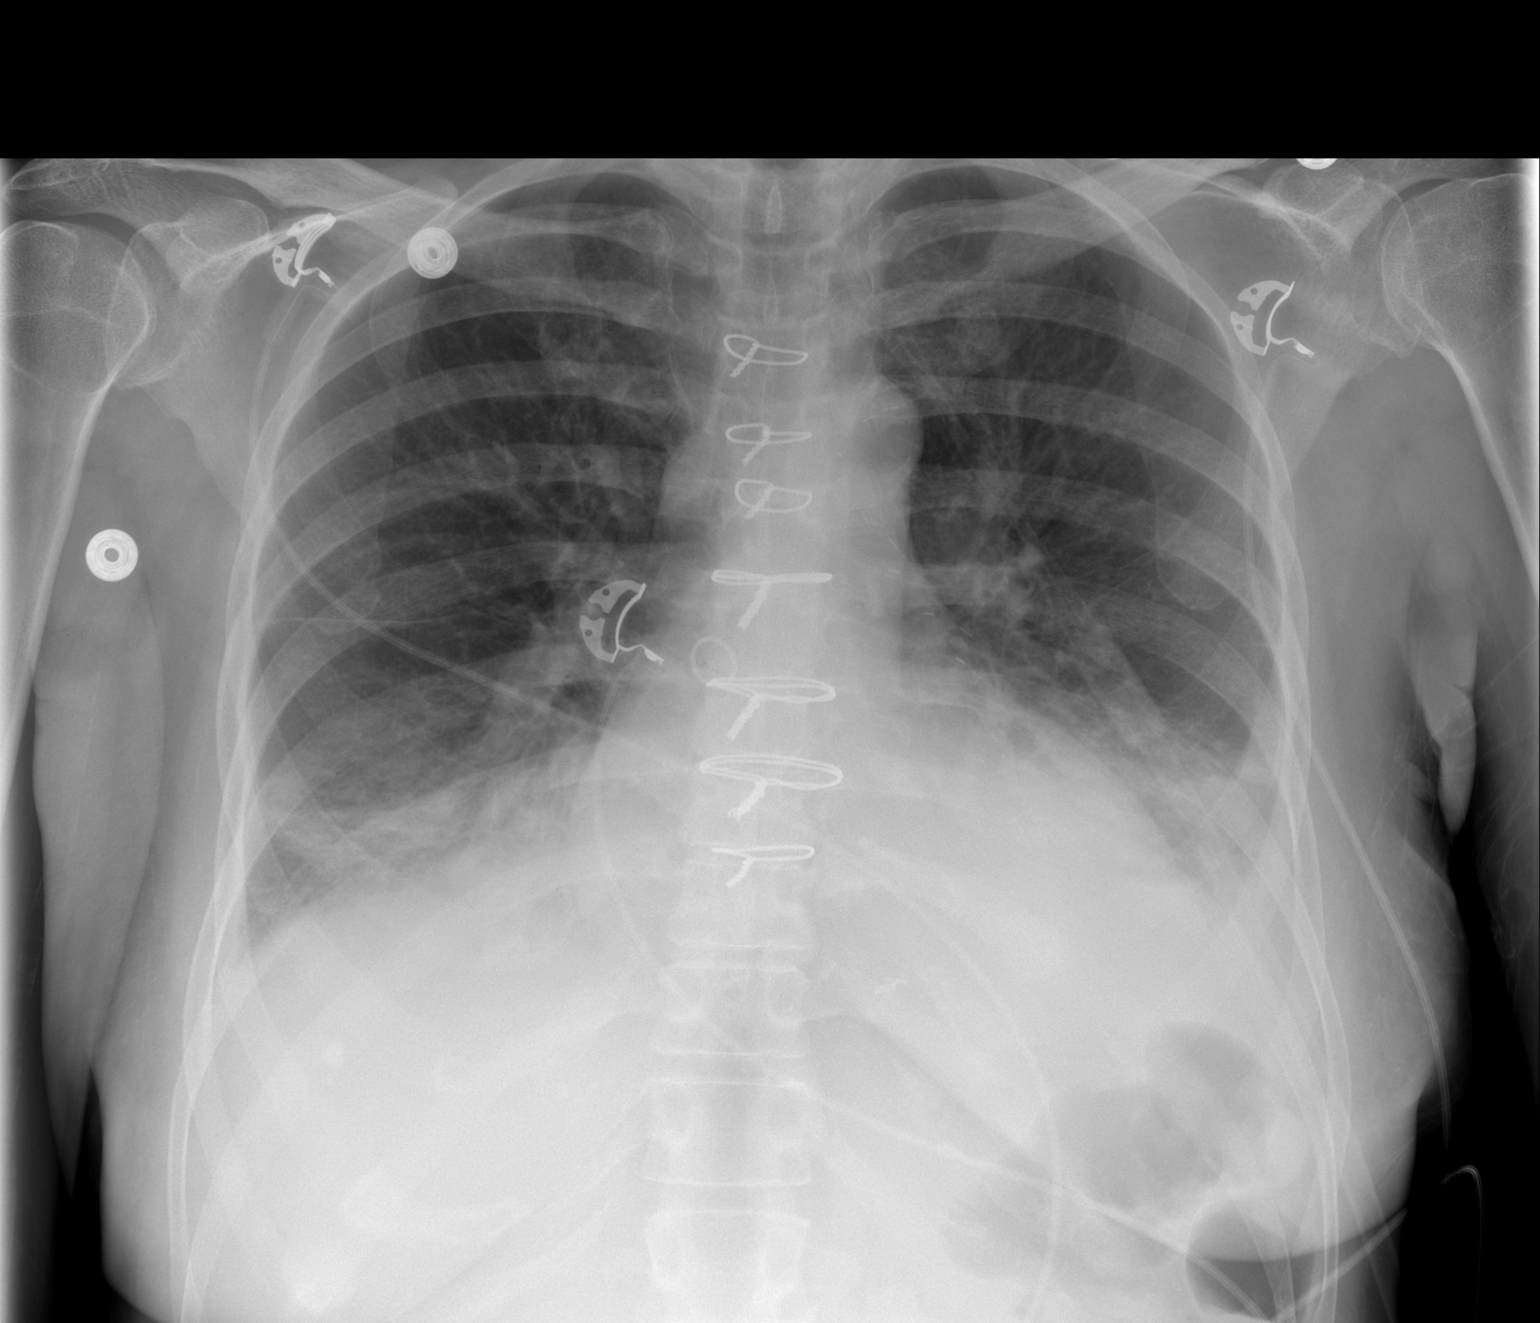

[2 of 2 positions shown; findings below may reference images not displayed]

FINDINGS: Trachea is midline. Heart size stable. Bibasilar airspace disease
persists. Small bilateral pleural effusions.
IMPRESSION: Persistent bibasilar airspace disease and small bilateral pleural
effusions. Findings may be due to pneumonia.

## 2015-01-07 IMAGING — CR DG CHEST 1V PORT
1 series · 1 of 1 positions shown · non-contrast
Comparison: Portable exam 7757 hr compared to 08/11/2013

CLINICAL DATA: Post back at catheter insertion

EXAM:
PORTABLE CHEST - 1 VIEW

[AP]
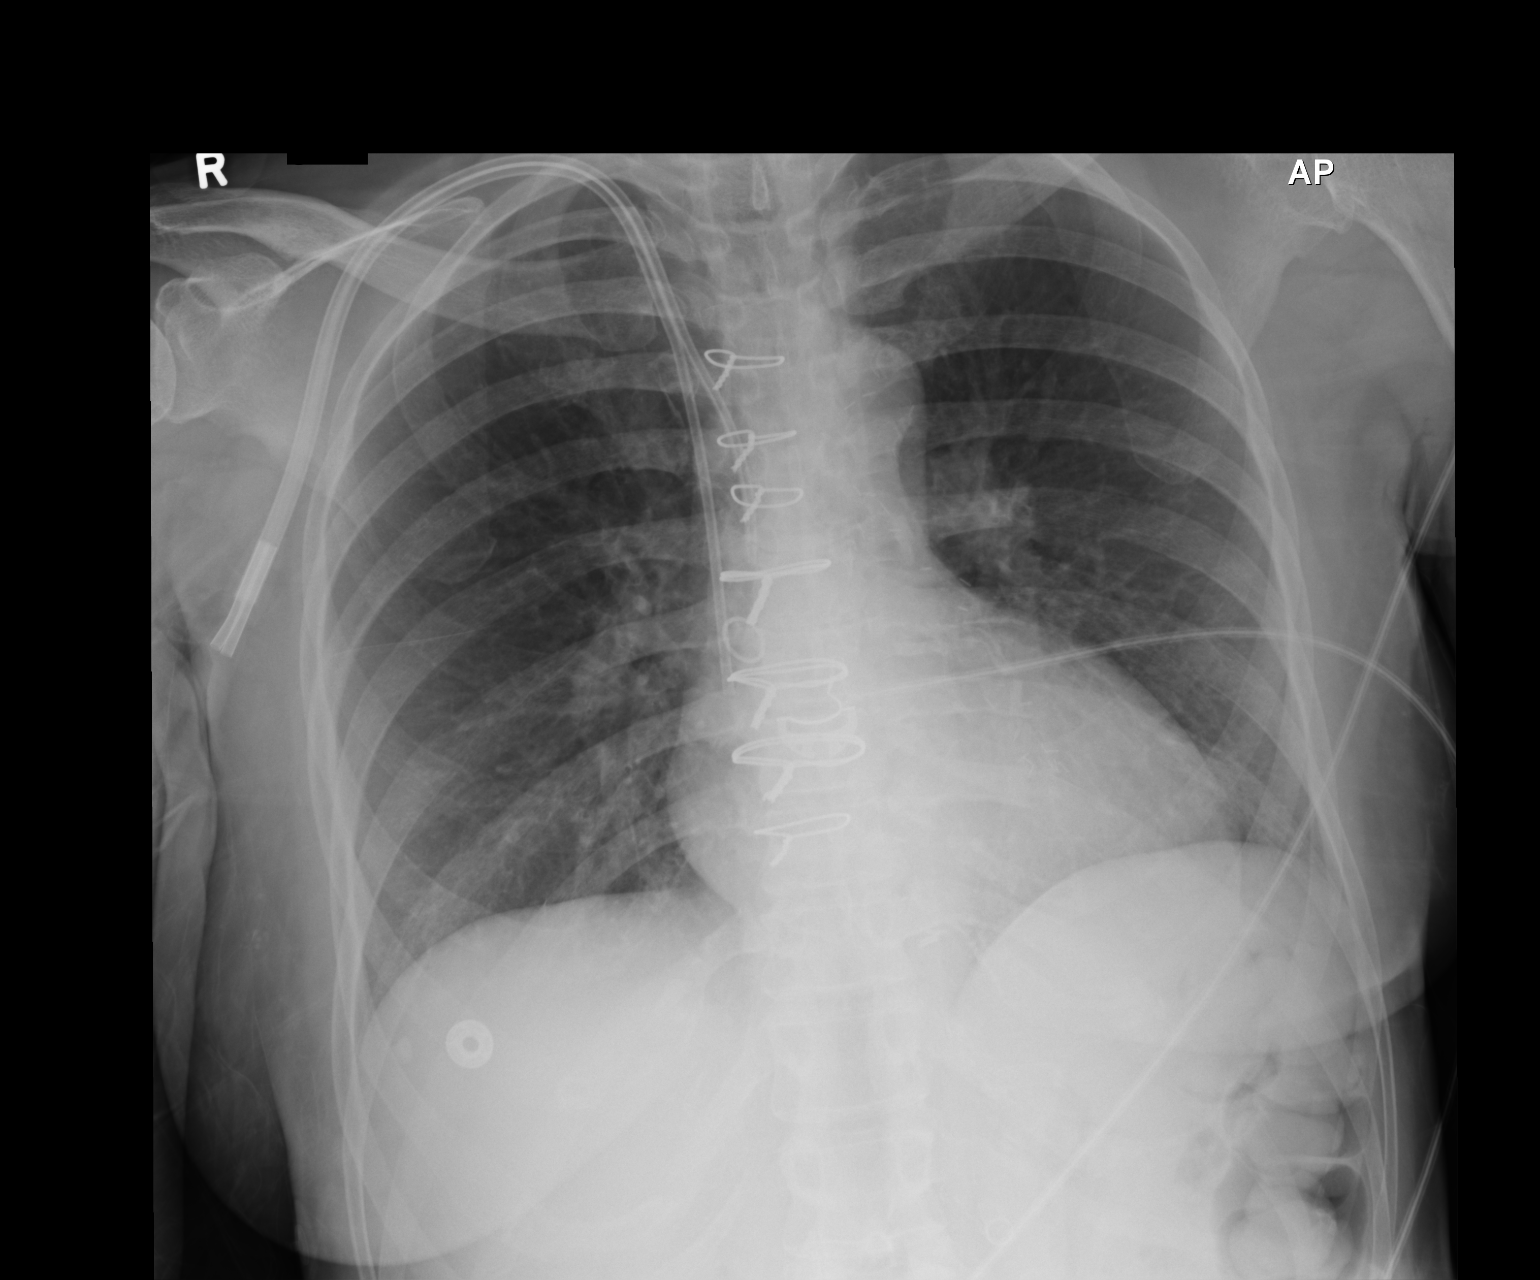

[1 of 1 positions shown; findings below may reference images not displayed]

FINDINGS: Right jugular dual-lumen central venous catheter with distal tip
projecting over cavoatrial junction.

Upper normal heart size post CABG.

Mediastinal contours and pulmonary vascularity normal.

Resolution of previously identified bibasilar effusions and
atelectasis.

Lungs grossly clear.

No pneumothorax.
IMPRESSION: No pneumothorax following central line insertion.

Distal tip of catheter projects over cavoatrial junction.

Mild bronchitic changes.

## 2015-01-13 IMAGING — CR DG CHEST 1V PORT
1 series · 1 of 1 positions shown · non-contrast
Comparison: DG CHEST 1V PORT dated 08/25/2013; DG CHEST 1V PORT
dated 08/24/2013

CLINICAL DATA: Cardiac arrest.

EXAM:
PORTABLE CHEST - 1 VIEW

[AP]
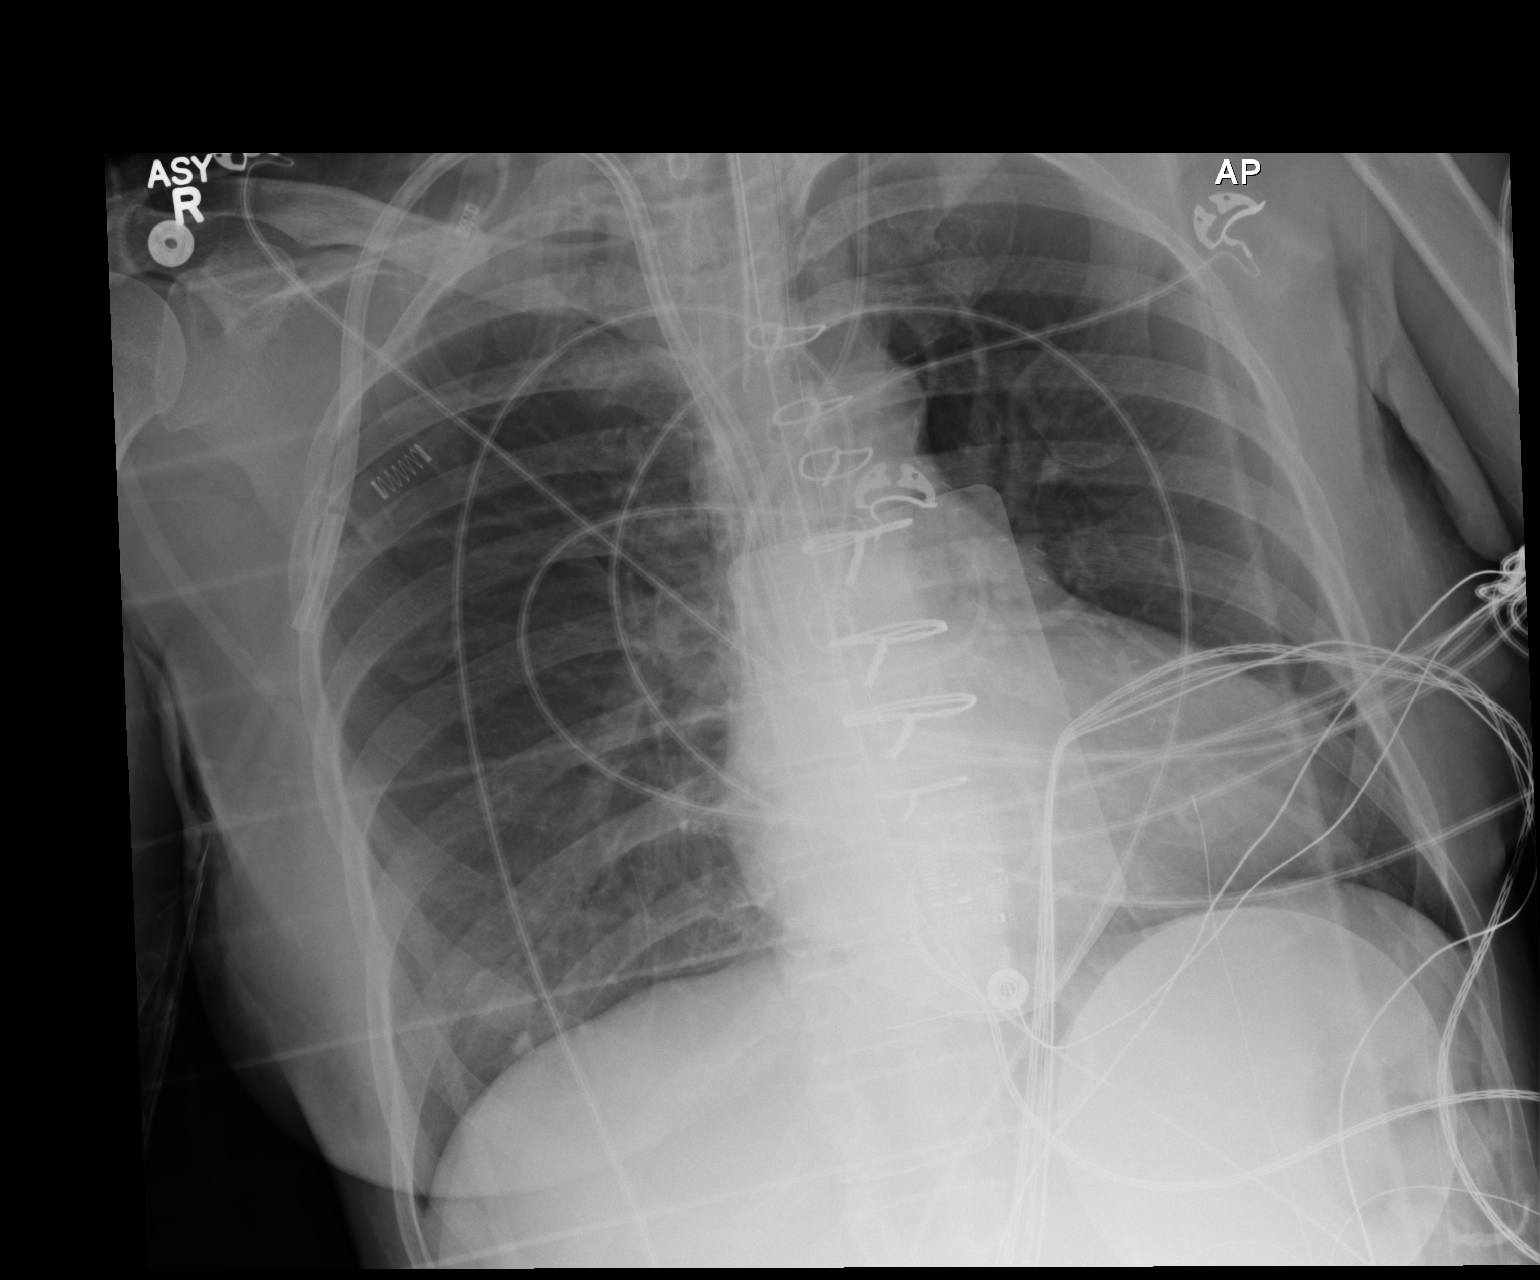

[1 of 1 positions shown; findings below may reference images not displayed]

FINDINGS: Endotracheal tube, dialysis catheter and left-sided central line
shows stable positioning. The heart size is stable. There is no
evidence of pulmonary edema, consolidation, pneumothorax, nodule or
pleural fluid.
IMPRESSION: Stable chest x-ray.  No edema or pneumothorax identified.
